# Patient Record
Sex: Male | Born: 1985 | Race: Black or African American | Hispanic: No | Marital: Single | Smoking: Former smoker
Health system: Southern US, Community
[De-identification: ages and names within clinical notes are randomized; demographics above are authoritative.]

## PROBLEM LIST (undated history)

## (undated) ENCOUNTER — Ambulatory Visit (HOSPITAL_COMMUNITY): Admission: EM | Disposition: A | Discharge status: Home / Self Care | Payer: No Payment, Other

## (undated) DIAGNOSIS — F319 Bipolar disorder, unspecified: Secondary | ICD-10-CM

## (undated) DIAGNOSIS — F909 Attention-deficit hyperactivity disorder, unspecified type: Secondary | ICD-10-CM

## (undated) DIAGNOSIS — E079 Disorder of thyroid, unspecified: Secondary | ICD-10-CM

## (undated) DIAGNOSIS — I10 Essential (primary) hypertension: Secondary | ICD-10-CM

## (undated) DIAGNOSIS — J302 Other seasonal allergic rhinitis: Secondary | ICD-10-CM

## (undated) DIAGNOSIS — F99 Mental disorder, not otherwise specified: Secondary | ICD-10-CM

## (undated) HISTORY — PX: WISDOM TOOTH EXTRACTION: SHX21

## (undated) HISTORY — DX: Essential (primary) hypertension: I10

## (undated) HISTORY — DX: Attention-deficit hyperactivity disorder, unspecified type: F90.9

## (undated) HISTORY — DX: Other seasonal allergic rhinitis: J30.2

---
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
## 2008-02-01 ENCOUNTER — Emergency Department (HOSPITAL_COMMUNITY): Admission: EM | Admit: 2008-02-01 | Discharge: 2008-02-01 | Payer: Self-pay | Admitting: Emergency Medicine

## 2011-04-29 ENCOUNTER — Emergency Department (HOSPITAL_COMMUNITY)
Admission: EM | Admit: 2011-04-29 | Discharge: 2011-04-29 | Disposition: A | Discharge status: Home / Self Care | Payer: BC Managed Care – PPO | Attending: Emergency Medicine | Admitting: Emergency Medicine

## 2011-04-29 DIAGNOSIS — M26609 Unspecified temporomandibular joint disorder, unspecified side: Secondary | ICD-10-CM | POA: Insufficient documentation

## 2011-04-29 DIAGNOSIS — H6120 Impacted cerumen, unspecified ear: Secondary | ICD-10-CM | POA: Insufficient documentation

## 2013-07-02 ENCOUNTER — Encounter (HOSPITAL_COMMUNITY): Payer: Self-pay | Admitting: Emergency Medicine

## 2013-07-02 ENCOUNTER — Emergency Department (HOSPITAL_COMMUNITY)
Admission: EM | Admit: 2013-07-02 | Discharge: 2013-07-03 | Disposition: A | Discharge status: Other Acute Inpatient Hospital | Payer: Self-pay | Attending: Emergency Medicine | Admitting: Emergency Medicine

## 2013-07-02 DIAGNOSIS — F319 Bipolar disorder, unspecified: Secondary | ICD-10-CM | POA: Diagnosis present

## 2013-07-02 DIAGNOSIS — F17200 Nicotine dependence, unspecified, uncomplicated: Secondary | ICD-10-CM | POA: Insufficient documentation

## 2013-07-02 DIAGNOSIS — F22 Delusional disorders: Secondary | ICD-10-CM | POA: Insufficient documentation

## 2013-07-02 DIAGNOSIS — R45850 Homicidal ideations: Secondary | ICD-10-CM

## 2013-07-02 DIAGNOSIS — R45851 Suicidal ideations: Secondary | ICD-10-CM | POA: Insufficient documentation

## 2013-07-02 DIAGNOSIS — Z79899 Other long term (current) drug therapy: Secondary | ICD-10-CM | POA: Insufficient documentation

## 2013-07-02 DIAGNOSIS — G479 Sleep disorder, unspecified: Secondary | ICD-10-CM | POA: Insufficient documentation

## 2013-07-02 DIAGNOSIS — F3012 Manic episode without psychotic symptoms, moderate: Secondary | ICD-10-CM | POA: Insufficient documentation

## 2013-07-02 DIAGNOSIS — F3112 Bipolar disorder, current episode manic without psychotic features, moderate: Secondary | ICD-10-CM

## 2013-07-02 DIAGNOSIS — Z8659 Personal history of other mental and behavioral disorders: Secondary | ICD-10-CM | POA: Insufficient documentation

## 2013-07-02 HISTORY — DX: Mental disorder, not otherwise specified: F99

## 2013-07-02 LAB — COMPREHENSIVE METABOLIC PANEL
AST: 28 U/L (ref 0–37)
Albumin: 4.4 g/dL (ref 3.5–5.2)
Alkaline Phosphatase: 58 U/L (ref 39–117)
Chloride: 101 mEq/L (ref 96–112)
Potassium: 3.8 mEq/L (ref 3.5–5.1)
Sodium: 138 mEq/L (ref 135–145)
Total Bilirubin: 0.8 mg/dL (ref 0.3–1.2)

## 2013-07-02 LAB — URINALYSIS, ROUTINE W REFLEX MICROSCOPIC
Hgb urine dipstick: NEGATIVE
Ketones, ur: NEGATIVE mg/dL
Protein, ur: 30 mg/dL — AB
Urobilinogen, UA: 1 mg/dL (ref 0.0–1.0)

## 2013-07-02 LAB — URINE MICROSCOPIC-ADD ON

## 2013-07-02 LAB — CBC
Platelets: 229 10*3/uL (ref 150–400)
RDW: 13.2 % (ref 11.5–15.5)
WBC: 7.5 10*3/uL (ref 4.0–10.5)

## 2013-07-02 LAB — RAPID URINE DRUG SCREEN, HOSP PERFORMED
Amphetamines: NOT DETECTED
Tetrahydrocannabinol: POSITIVE — AB

## 2013-07-02 MED ORDER — IBUPROFEN 200 MG PO TABS: 600.0000 mg | ORAL_TABLET | Freq: Three times a day (TID) | ORAL | Status: DC | PRN

## 2013-07-02 MED ORDER — ACETAMINOPHEN 325 MG PO TABS
650.0000 mg | ORAL_TABLET | ORAL | Status: DC | PRN
Start: 2013-07-02 — End: 2013-07-03
  Administered 2013-07-02 – 2013-07-03 (×3): 650 mg via ORAL
  Filled 2013-07-02 (×3): qty 2

## 2013-07-02 MED ORDER — LORAZEPAM 1 MG PO TABS
1.0000 mg | ORAL_TABLET | Freq: Three times a day (TID) | ORAL | Status: DC | PRN
  Administered 2013-07-02: 1 mg via ORAL
  Filled 2013-07-02 (×2): qty 1

## 2013-07-02 NOTE — ED Provider Notes (Signed)
History    CSN: [id number] Arrival date & time 07/02/13  1554  First MD Initiated Contact with Patient 07/02/13 1607     Chief Complaint  Patient presents with  . Medical Clearance   (Consider location/radiation/quality/duration/timing/severity/associated sxs/prior Treatment) The history is provided by the patient.  John [name] is a 27 y.o. male hx of ADHD, adopted here with suicidal, homicidal ideations. He has been having some relationship trouble and lost his job recently. He has not been sleeping for several weeks and was given some Xanax and now sleeps better. Family called the police and reported that is been having suicidal thoughts and "didn't care if he died". Family also reported some homicidal thoughts against his former girlfriend. He also has been using marijuana and sometimes alcohol. He smokes cigarettes but denies any other drug use. Denies any hallucinations and homicidal and suicidal ideations currently. However he is very tangential and wouldn't answer questions directly. Denies other medical problems. He has history of ADHD and he said he had some psychological issues because he was adopted but was never diagnosed with any psychiatric illness.   History reviewed. No pertinent past medical history. History reviewed. No pertinent past surgical history. No family history on file. History  Substance Use Topics  . Smoking status: Current Every Day Smoker    Types: Cigarettes  . Smokeless tobacco: Not on file  . Alcohol Use: No    Review of Systems  Psychiatric/Behavioral: Positive for suicidal ideas and sleep disturbance.  All other systems reviewed and are negative.    Allergies  Review of patient's allergies indicates no known allergies.  Home Medications   Current Outpatient Rx  Name  Route  Sig  Dispense  Refill  . Multiple Vitamin (MULTIVITAMIN WITH MINERALS) TABS   Oral   Take 1 tablet by mouth daily.          BP 154/116  Pulse 90  Temp(Src)  99.2 F (37.3 C) (Oral)  Resp 18  SpO2 100% Physical Exam  Nursing note and vitals reviewed. Constitutional: He is oriented to person, place, and time.  Slightly agitated, tangential   HENT:  Head: Normocephalic.  Mouth/Throat: Oropharynx is clear and moist.  Eyes: Conjunctivae are normal. Pupils are equal, round, and reactive to light.  Neck: Normal range of motion. Neck supple.  Cardiovascular: Regular rhythm and normal heart sounds.   Slightly tachy   Pulmonary/Chest: Effort normal and breath sounds normal. No respiratory distress. He has no wheezes. He has no rales.  Abdominal: Soft. Bowel sounds are normal. He exhibits no distension. There is no tenderness. There is no rebound and no guarding.  Musculoskeletal: Normal range of motion.  Neurological: He is alert and oriented to person, place, and time.  Skin: Skin is warm and dry.  Psychiatric:  Tangential. Slightly agitated. Pressured speech. Poor judgment     ED Course  Procedures (including critical care time) Labs Reviewed  URINE RAPID DRUG SCREEN (HOSP PERFORMED) - Abnormal; Notable for the following:    Tetrahydrocannabinol POSITIVE (*)    All other components within normal limits  URINALYSIS, ROUTINE W REFLEX MICROSCOPIC - Abnormal; Notable for the following:    Specific Gravity, Urine 1.034 (*)    Protein, ur 30 (*)    All other components within normal limits  CBC  COMPREHENSIVE METABOLIC PANEL  ETHANOL  URINE MICROSCOPIC-ADD ON   No results found. No diagnosis found.  MDM  John [name] is a 27 y.o. male here with agitation, pressured speech, possible  suicidal ideations. I think he may be having manic episode. Will call [facility], will likely need psych admission.   6:57 PM Labs unremarkable. UDS + THC. Transfer to [facility] for psych holding.    Richardean Canal, MD 07/02/13 [phone]

## 2013-07-02 NOTE — ED Notes (Signed)
Report called to Ellsworth Lennox RN

## 2013-07-02 NOTE — ED Notes (Signed)
Patient Wanded. Patient phone had a broken screen when he came in to the ED 

## 2013-07-02 NOTE — ED Notes (Signed)
Pt presenting to ed with [employer] at bedside with IVC paperwork. Pt states he is stressed out because of things that are going on with his girlfriend. Pt denies SI/HI at this time.

## 2013-07-02 NOTE — ED Notes (Signed)
Patient John [name]. Patient phone had a broken screen when he came in to the ED

## 2013-07-03 ENCOUNTER — Encounter (HOSPITAL_COMMUNITY): Payer: Self-pay | Admitting: *Deleted

## 2013-07-03 ENCOUNTER — Inpatient Hospital Stay (HOSPITAL_COMMUNITY)
Admission: AD | Admit: 2013-07-03 | Discharge: 2013-07-09 | DRG: 885 | Disposition: A | Discharge status: Home / Self Care | Payer: No Typology Code available for payment source | Source: Intra-hospital | Attending: Psychiatry | Admitting: Psychiatry

## 2013-07-03 DIAGNOSIS — Z79899 Other long term (current) drug therapy: Secondary | ICD-10-CM

## 2013-07-03 DIAGNOSIS — R45851 Suicidal ideations: Secondary | ICD-10-CM

## 2013-07-03 DIAGNOSIS — F411 Generalized anxiety disorder: Secondary | ICD-10-CM

## 2013-07-03 DIAGNOSIS — F319 Bipolar disorder, unspecified: Secondary | ICD-10-CM | POA: Diagnosis present

## 2013-07-03 DIAGNOSIS — F1210 Cannabis abuse, uncomplicated: Secondary | ICD-10-CM | POA: Diagnosis present

## 2013-07-03 DIAGNOSIS — F3130 Bipolar disorder, current episode depressed, mild or moderate severity, unspecified: Principal | ICD-10-CM | POA: Diagnosis present

## 2013-07-03 DIAGNOSIS — R45850 Homicidal ideations: Secondary | ICD-10-CM

## 2013-07-03 DIAGNOSIS — F1910 Other psychoactive substance abuse, uncomplicated: Secondary | ICD-10-CM

## 2013-07-03 DIAGNOSIS — F3110 Bipolar disorder, current episode manic without psychotic features, unspecified: Secondary | ICD-10-CM

## 2013-07-03 MED ORDER — LORAZEPAM 2 MG/ML IJ SOLN
2.0000 mg | Freq: Once | INTRAMUSCULAR | Status: AC
  Administered 2013-07-03: 2 mg via INTRAMUSCULAR
  Filled 2013-07-03: qty 1

## 2013-07-03 MED ORDER — LORAZEPAM 1 MG PO TABS: 2.0000 mg | ORAL_TABLET | Freq: Once | ORAL | Status: DC

## 2013-07-03 MED ORDER — QUETIAPINE FUMARATE 50 MG PO TABS
50.0000 mg | ORAL_TABLET | Freq: Every evening | ORAL | Status: DC | PRN
  Administered 2013-07-03: 50 mg via ORAL
  Filled 2013-07-03: qty 1

## 2013-07-03 MED ORDER — ACETAMINOPHEN 325 MG PO TABS: 650.0000 mg | ORAL_TABLET | Freq: Four times a day (QID) | ORAL | Status: DC | PRN

## 2013-07-03 MED ORDER — TRAZODONE HCL 50 MG PO TABS
50.0000 mg | ORAL_TABLET | Freq: Every evening | ORAL | Status: DC | PRN
  Administered 2013-07-03: 50 mg via ORAL
  Filled 2013-07-03 (×5): qty 1

## 2013-07-03 MED ORDER — QUETIAPINE FUMARATE 50 MG PO TABS: 50.0000 mg | ORAL_TABLET | Freq: Every evening | ORAL | Status: DC | PRN

## 2013-07-03 MED ORDER — MAGNESIUM HYDROXIDE 400 MG/5ML PO SUSP: 30.0000 mL | Freq: Every day | ORAL | Status: DC | PRN

## 2013-07-03 MED ORDER — ALUM & MAG HYDROXIDE-SIMETH 200-200-20 MG/5ML PO SUSP: 30.0000 mL | ORAL | Status: DC | PRN

## 2013-07-03 NOTE — ED Provider Notes (Signed)
Called by nurse due to patient increased anxiety and increased blood pressure. Patient will be given Ativan IM and blood pressure will be reassessed at this. Patient has no known history of high blood pressure and is a symptomatic at this time and his hypertension is likely from anxiety  John Baker, MD 07/03/13 1247

## 2013-07-03 NOTE — BHH Counselor (Signed)
Patient accepted to [facility] by Donell Sievert to Dr. Jannifer Franklin. The room assignment is 504-1. The nursing report # is [phone].

## 2013-07-03 NOTE — Progress Notes (Signed)
[facility] ED CM noted pt without a pcp CM spoke with pt who stated he did not have a pcp Pt began to talk about his girlfriend and her "cheating on him"

## 2013-07-03 NOTE — ED Notes (Signed)
Family at bedside.  tx called

## 2013-07-03 NOTE — Progress Notes (Signed)
Adult Psychoeducational Group Note  Date:  07/03/2013 Time:  10:15 PM  Group Topic/Focus:  Healthy Communication:   The focus of this group is to discuss communication, barriers to communication, as well as healthy ways to communicate with others.  Participation Level:  Minimal  Participation Quality:  Sharing  Affect:  Appropriate  Cognitive:  Alert and Appropriate  Insight: Appropriate and Good  Engagement in Group:  Limited and Supportive  Modes of Intervention:  Problem-solving  Additional Comments:  John [name] was asleep during part of the group.  He was appropriate.  [name] 07/03/2013, 10:15 PM
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
## 2013-07-03 NOTE — Consult Note (Signed)
Reason for Consult:Eval for Ip psychiatric mgmt Referring Physician: [facility] EDP  John [name] is an 27 y.o. male.  HPI: Pt is a 27 y/o AAM presenting to [facility] ED via GBP under IVC orders per request of his brother John [name]. IVC papers reportedly  Note patient is a potential harm to himself and others, as he has threatened to commit suicide as well has threatened to harm others. Patient has also reportedly been delusional and was recently fired from his job reasons unknown. Patient does endorse a hx of ADHD but  hasnt  been medicated for quite some time. Patient denies a hx of depression, psychosis, schizophrenia or bipolar d/o. Patient denies previous psychiatric hospitalizations. The patient is also denying the accusations listed per the IVC . The patient is denying SI/SA or AVH and states that he can contract for safety at this present time.  Past Medical History  Diagnosis Date  . Mental disorder     History reviewed. No pertinent past surgical history.  No family history on file.  Social History:  reports that he has been smoking Cigarettes.  He has been smoking about 0.00 packs per day. He does not have any smokeless tobacco history on file. He reports that he uses illicit drugs (Marijuana). He reports that he does not drink alcohol.  Allergies: No Known Allergies  Medications: I have reviewed the patient's current medications.  Results for orders placed during the hospital encounter of 07/02/13 (from the past 48 hour(s))  CBC     Status: None   Collection Time    07/02/13  4:20 PM      Result Value Range   WBC 7.5  4.0 - 10.5 K/uL   RBC 4.93  4.22 - 5.81 MIL/uL   Hemoglobin 14.9  13.0 - 17.0 g/dL   HCT 96.0  45.4 - 09.8 %   MCV 85.2  78.0 - 100.0 fL   MCH 30.2  26.0 - 34.0 pg   MCHC 35.5  30.0 - 36.0 g/dL   RDW 11.9  14.7 - 82.9 %   Platelets 229  150 - 400 K/uL  COMPREHENSIVE METABOLIC PANEL     Status: None   Collection Time    07/02/13  4:20 PM      Result Value Range    Sodium 138  135 - 145 mEq/L   Potassium 3.8  3.5 - 5.1 mEq/L   Chloride 101  96 - 112 mEq/L   CO2 27  19 - 32 mEq/L   Glucose, Bld 89  70 - 99 mg/dL   BUN 14  6 - 23 mg/dL   Creatinine, Ser 5.62  0.50 - 1.35 mg/dL   Calcium 13.0  8.4 - 86.5 mg/dL   Total Protein 7.6  6.0 - 8.3 g/dL   Albumin 4.4  3.5 - 5.2 g/dL   AST 28  0 - 37 U/L   ALT 19  0 - 53 U/L   Alkaline Phosphatase 58  39 - 117 U/L   Total Bilirubin 0.8  0.3 - 1.2 mg/dL   GFR calc non Af Amer >90  >90 mL/min   GFR calc Af Amer >90  >90 mL/min   Comment:            The eGFR has been calculated     using the CKD EPI equation.     This calculation has not been     validated in all clinical     situations.     eGFR's  persistently     <90 mL/min signify     possible Chronic Kidney Disease.  ETHANOL     Status: None   Collection Time    07/02/13  4:20 PM      Result Value Range   Alcohol, Ethyl (B) <11  0 - 11 mg/dL   Comment:            LOWEST DETECTABLE LIMIT FOR     SERUM ALCOHOL IS 11 mg/dL     FOR MEDICAL PURPOSES ONLY  URINE RAPID DRUG SCREEN (HOSP PERFORMED)     Status: Abnormal   Collection Time    07/02/13  5:05 PM      Result Value Range   Opiates NONE DETECTED  NONE DETECTED   Cocaine NONE DETECTED  NONE DETECTED   Benzodiazepines NONE DETECTED  NONE DETECTED   Amphetamines NONE DETECTED  NONE DETECTED   Tetrahydrocannabinol POSITIVE (*) NONE DETECTED   Barbiturates NONE DETECTED  NONE DETECTED   Comment:            DRUG SCREEN FOR MEDICAL PURPOSES     ONLY.  IF CONFIRMATION IS NEEDED     FOR ANY PURPOSE, NOTIFY LAB     WITHIN 5 DAYS.                LOWEST DETECTABLE LIMITS     FOR URINE DRUG SCREEN     Drug Class       Cutoff (ng/mL)     Amphetamine      1000     Barbiturate      200     Benzodiazepine   200     Tricyclics       300     Opiates          300     Cocaine          300     THC              50  URINALYSIS, ROUTINE W REFLEX MICROSCOPIC     Status: Abnormal   Collection Time     07/02/13  5:06 PM      Result Value Range   Color, Urine YELLOW  YELLOW   APPearance CLEAR  CLEAR   Specific Gravity, Urine 1.034 (*) 1.005 - 1.030   pH 7.5  5.0 - 8.0   Glucose, UA NEGATIVE  NEGATIVE mg/dL   Hgb urine dipstick NEGATIVE  NEGATIVE   Bilirubin Urine NEGATIVE  NEGATIVE   Ketones, ur NEGATIVE  NEGATIVE mg/dL   Protein, ur 30 (*) NEGATIVE mg/dL   Urobilinogen, UA 1.0  0.0 - 1.0 mg/dL   Nitrite NEGATIVE  NEGATIVE   Leukocytes, UA NEGATIVE  NEGATIVE  URINE MICROSCOPIC-ADD ON     Status: None   Collection Time    07/02/13  5:06 PM      Result Value Range   Urine-Other MUCOUS PRESENT      No results found.  Review of Systems  Unable to perform ROS: psychiatric disorder  Psychiatric/Behavioral: Positive for substance abuse. Negative for depression, suicidal ideas, hallucinations and memory loss. The patient has insomnia. The patient is not nervous/anxious.        Patient is denying SI/SA/H or AVH   Blood pressure 152/105, pulse 67, temperature 99.2 F (37.3 C), temperature source Oral, resp. rate 18, SpO2 100.00%. Physical Exam  Constitutional: He is oriented to person, place, and time. He appears well-developed.  HENT:  Head: Normocephalic.  Eyes: Pupils are equal, round, and reactive to light.  Neck: Neck supple. No thyromegaly present.  Cardiovascular: Normal rate.   Respiratory: Breath sounds normal.  GI: Bowel sounds are normal.  Neurological: He is alert and oriented to person, place, and time.  Skin: Skin is warm and dry.  Psychiatric:  Patient alert and orientated, with good eye contact and rambling non pressured speech pattern flight of ideas with varied thoughts, denies paranoia, SI/HI or AVH    Assessment/Plan: 1) Admit to [address] [facility] for safety, crises mgmt and stabilization of Bipolar 1 with mania but without AVH 2) Social work to aid in OP support psychiatric services 3) Administration of psychotropics and psychotherapeutic interventions 4) mgmt  of co-morbid conditions  Ariyonna Twichell E 07/03/2013, 1:12 AM

## 2013-07-03 NOTE — Tx Team (Signed)
Initial Interdisciplinary Treatment Plan  PATIENT STRENGTHS: (choose at least two) Ability for insight Average or above average intelligence Capable of independent living  PATIENT STRESSORS: Loss of girlfriend   PROBLEM LIST: Problem List/Patient Goals Date to be addressed Date deferred Reason deferred Estimated date of resolution  Depression 07/03/13                                                      DISCHARGE CRITERIA:  Ability to meet basic life and health needs Improved stabilization in mood, thinking, and/or behavior  PRELIMINARY DISCHARGE PLAN: Attend aftercare/continuing care group  PATIENT/FAMIILY INVOLVEMENT: This treatment plan has been presented to and reviewed with the patient, John [name], and/or family member, .  The patient and family have been given the opportunity to ask questions and make suggestions.  John [name], [place] 07/03/2013, 8:15 PM
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
## 2013-07-03 NOTE — Progress Notes (Signed)
[facility] CL spoke with patient. Pt stated that he has [employer], but took the [employer], "just in case." Highlighted information about [employer] of the [place]. Patient stated he was familiar with [facility] of the [place]. Provided him with their Walk In Clinic hours.

## 2013-07-04 DIAGNOSIS — F329 Major depressive disorder, single episode, unspecified: Secondary | ICD-10-CM

## 2013-07-04 DIAGNOSIS — F1910 Other psychoactive substance abuse, uncomplicated: Secondary | ICD-10-CM

## 2013-07-04 DIAGNOSIS — F3110 Bipolar disorder, current episode manic without psychotic features, unspecified: Secondary | ICD-10-CM

## 2013-07-04 DIAGNOSIS — F1994 Other psychoactive substance use, unspecified with psychoactive substance-induced mood disorder: Secondary | ICD-10-CM

## 2013-07-04 MED ORDER — TRAZODONE HCL 100 MG PO TABS
100.0000 mg | ORAL_TABLET | Freq: Every day | ORAL | Status: DC
  Administered 2013-07-04 – 2013-07-05 (×2): 100 mg via ORAL
  Filled 2013-07-04 (×3): qty 1

## 2013-07-04 MED ORDER — HYDROXYZINE HCL 50 MG PO TABS: 50.0000 mg | ORAL_TABLET | Freq: Every evening | ORAL | Status: DC | PRN

## 2013-07-04 MED ORDER — DIVALPROEX SODIUM 250 MG PO DR TAB
250.0000 mg | DELAYED_RELEASE_TABLET | Freq: Two times a day (BID) | ORAL | Status: DC
  Administered 2013-07-04 – 2013-07-05 (×2): 250 mg via ORAL
  Filled 2013-07-04 (×4): qty 1
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
## 2013-07-04 NOTE — BHH Group Notes (Signed)
[facility] LCSW Aftercare Discharge Planning Group Note   07/04/2013 12:02 PM  Participation Quality:  Appropriate  Mood/Affect:  Appropriate  Depression Rating:  0  Anxiety Rating:  2  Thoughts of Suicide:  No  Will you contract for safety?   NA  Current AVH:  No  Plan for Discharge/Comments:  Patient attending discharge planning group and actively participated in group.  Patient advised of admitting due to brother putting him here.  He shared he had a nervous breakdown two weeks ago.  Patient will need a referral for outpatient services.  He advised of having transportation and being able to obtain medications. CSW provided all participants with daily workbook.  Patient also advised of services offered by [facility].  Transportation Means:   Patient has transportation.   Supports:  Patient has limited support system.   John [name], John [name]

## 2013-07-04 NOTE — Progress Notes (Signed)
Recreation Therapy Notes   Date: 07.23.2014  Time: 3:00pm Location: [address] Dayroom  Group Topic/Focus: Problem Solving, Communication  Participation Level:  Active  Participation Quality:  Appropriate, Attentive and Sharing  Affect:  Euthymic to bright at times  Cognitive:  Appropriate  Additional Comments: Activity: Life Boat ; Explanation: Patients were given the following scenario: We have chartered a Radio producer for the afternoon with the below listed guests, half way through out trip the [employer] a leak and we start to sink. We can put everyone in the group on a life raft and sail back to shore. Including the people in this room we can eight of out guests. The list of guest is as follows: Materials engineer, [address], Janyth Pupa, Zorita Pang, [place], Runner, broadcasting/film/video, Chef, Curator, Personnel officer, Ex-Convict, Ex-Marine, Pregnant Woman, Physician, Nurse.   Patient actively participated in group activity. Patient voiced his opinion and debated appropriately with group members. Patient made suggestion to group to take their skills into account. Patient continued to use logical decision making skills throughout session. Patient identified good decision making as skills necessary to complete this activity.    Marykay Lex Aladdin Kollmann, LRT/CTRS  Halynn Reitano L 07/04/2013 4:20 PM
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
## 2013-07-04 NOTE — Progress Notes (Signed)
Adult Psychoeducational Group Note  Date:  07/04/2013 Time:  10:42 PM  Group Topic/Focus:  Wrap-Up Group:   The focus of this group is to help patients review their daily goal of treatment and discuss progress on daily workbooks.  Participation Level:  Active  Participation Quality:  Appropriate and Supportive  Affect:  Appropriate  Cognitive:  Appropriate  Insight: Appropriate  Engagement in Group:  Engaged and Supportive  Modes of Intervention:  Discussion, Socialization and Support  Additional Comments:  Pt stated he had a good day. Pt stated his goal was to have fun. Pt stated he enjoyed playing basketball outside.  Caswell Corwin 07/04/2013, 10:42 PM

## 2013-07-04 NOTE — Progress Notes (Signed)
D: Pt was given med ed on trazadone as requested.Pt. Refused trazadone @ HS. Pt stated " I am already sleepy!"A:Supported & encouraged.

## 2013-07-04 NOTE — Progress Notes (Signed)
D: Pt visible on the unit. Pt. Questioning his medications. Pt with some grandeur & flight of ideas. A: Med . Ed given . Dressings applied to lt index finger;rt wrist & lt forearm. Supported & encouraged. Continues on 15 minute checks.R: Pt safety maintained.
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
## 2013-07-04 NOTE — Progress Notes (Signed)
Adult Psychoeducational Group Note  Date:  07/04/2013 Time:  10:54 AM  Group Topic/Focus:  Therapeutic Activity  Participation Level:  Active  Participation Quality:  Appropriate, Attentive and Sharing  Affect:  Appropriate  Cognitive:  Appropriate  Insight: Appropriate  Engagement in Group:  Engaged  Modes of Intervention:  Activity  Additional Comments:  Pt participated in playing Fact or Crap and Can you name that five. Pt was appropriate and appeared pleasant by interacting and laughing with other pts.   Sharyn Lull 07/04/2013, 10:54 AM

## 2013-07-04 NOTE — [facility] Suicide Risk Assessment (Signed)
Suicide Risk Assessment  Admission Assessment     Nursing information obtained from:    Demographic factors:    Current Mental Status:    Loss Factors:    Historical Factors:    Risk Reduction Factors:     CLINICAL FACTORS:   Severe Anxiety and/or Agitation Bipolar Disorder:   Mixed State Depression:   Anhedonia Comorbid alcohol abuse/dependence Hopelessness Impulsivity Insomnia Recent sense of peace/wellbeing Severe Alcohol/Substance Abuse/Dependencies Unstable or Poor Therapeutic Relationship  COGNITIVE FEATURES THAT CONTRIBUTE TO RISK:  Closed-mindedness Loss of executive function Polarized thinking    SUICIDE RISK:   Mild:  Suicidal ideation of limited frequency, intensity, duration, and specificity.  There are no identifiable plans, no associated intent, mild dysphoria and related symptoms, good self-control (both objective and subjective assessment), few other risk factors, and identifiable protective factors, including available and accessible social support.  PLAN OF CARE: This is a involuntary admission for bipolar disorder and no treatment for crisis stabilization and medication management. He was in counseling while in foster care, in [place], [place]  I certify that inpatient services furnished can reasonably be expected to improve the patient's condition.   Hank Walling,JANARDHAHA R. 07/04/2013, 1:55 PM

## 2013-07-05 DIAGNOSIS — F1210 Cannabis abuse, uncomplicated: Secondary | ICD-10-CM

## 2013-07-05 MED ORDER — HYDROXYZINE HCL 50 MG PO TABS
50.0000 mg | ORAL_TABLET | Freq: Two times a day (BID) | ORAL | Status: DC
  Administered 2013-07-05 – 2013-07-09 (×9): 50 mg via ORAL
  Filled 2013-07-05 (×3): qty 1
  Filled 2013-07-05: qty 28
  Filled 2013-07-05 (×6): qty 1
  Filled 2013-07-05: qty 28
  Filled 2013-07-05 (×2): qty 1

## 2013-07-05 MED ORDER — DIVALPROEX SODIUM 500 MG PO DR TAB
500.0000 mg | DELAYED_RELEASE_TABLET | Freq: Two times a day (BID) | ORAL | Status: DC
  Administered 2013-07-05 – 2013-07-09 (×8): 500 mg via ORAL
  Filled 2013-07-05 (×2): qty 1
  Filled 2013-07-05: qty 28
  Filled 2013-07-05 (×2): qty 1
  Filled 2013-07-05: qty 28
  Filled 2013-07-05 (×6): qty 1

## 2013-07-05 NOTE — Progress Notes (Signed)
[facility] MD Progress Note  07/05/2013 1:58 PM John [name]  MRN:  [id number] Subjective: Patient has been struggling with mood swings, sleep disturbance, grandiose delusions LOA, FOI and disorganized speech. He has poor insight, judgment and impulse control. He has been partially cooperative and taking his medications and attending groups.   Diagnosis:  Axis I: Bipolar, Manic, Cannabis abuse  ADL's:  Intact  Sleep: Fair  Appetite:  Fair  Suicidal Ideation:  He has threatened his girl friend due to paranoid delusions Homicidal Ideation:  He has threatened his girl friend due to paranoid delusions AEB (as evidenced by):  Psychiatric Specialty Exam: ROS  Blood pressure 139/82, pulse 60, temperature 98.6 F (37 C), temperature source Oral, resp. rate 16, height 6\' 4"  (1.93 m), weight 79.379 kg (175 lb).Body mass index is 21.31 kg/(m^2).  General Appearance: Bizarre, Disheveled and Guarded  Eye Contact::  Minimal  Speech:  Pressured  Volume:  Normal  Mood:  Anxious and Euphoric  Affect:  Non-Congruent, Inappropriate and Labile  Thought Process:  Disorganized, Loose and Tangential  Orientation:  Full (Time, Place, and Person)  Thought Content:  Rumination  Suicidal Thoughts:  Yes.  without intent/plan  Homicidal Thoughts:  Yes.  without intent/plan  Memory:  Immediate;   Fair  Judgement:  Impaired  Insight:  Lacking  Psychomotor Activity:  Psychomotor Retardation  Concentration:  Fair  Recall:  Fair  Akathisia:  NA  Handed:  Right  AIMS (if indicated):     Assets:  Communication Skills Desire for Improvement Physical Health Resilience Social Support  Sleep:  Number of Hours: 5.75   Current Medications: Current Facility-Administered Medications  Medication Dose Route Frequency Provider Last Rate Last Dose  . acetaminophen (TYLENOL) tablet 650 mg  650 mg Oral Q6H PRN Kerry Hough, PA-C      . alum & mag hydroxide-simeth (MAALOX/MYLANTA) 200-200-20 MG/5ML suspension 30 mL   30 mL Oral Q4H PRN Kerry Hough, PA-C      . divalproex (DEPAKOTE) DR tablet 500 mg  500 mg Oral Q12H Nehemiah Settle, MD      . hydrOXYzine (ATARAX/VISTARIL) tablet 50 mg  50 mg Oral BID Nehemiah Settle, MD      . magnesium hydroxide (MILK OF MAGNESIA) suspension 30 mL  30 mL Oral Daily PRN Kerry Hough, PA-C      . traZODone (DESYREL) tablet 100 mg  100 mg Oral QHS Nehemiah Settle, MD   100 mg at 07/04/13 2306    Lab Results: No results found for this or any previous visit (from the past 48 hour(s)).  Physical Findings: AIMS: Facial and Oral Movements Muscles of Facial Expression: None, normal Lips and Perioral Area: None, normal Jaw: None, normal Tongue: None, normal,Extremity Movements Upper (arms, wrists, hands, fingers): None, normal Lower (legs, knees, ankles, toes): None, normal, Trunk Movements Neck, shoulders, hips: None, normal, Overall Severity Severity of abnormal movements (highest score from questions above): None, normal Incapacitation due to abnormal movements: None, normal Patient's awareness of abnormal movements (rate only patient's report): No Awareness, Dental Status Current problems with teeth and/or dentures?: No Does patient usually wear dentures?: No  CIWA:  CIWA-Ar Total: 2 COWS:  COWS Total Score: 2  Treatment Plan Summary: Daily contact with patient to assess and evaluate symptoms and progress in treatment Medication management  Plan: Treatment Plan/Recommendations:  1. Admit with IVC for crisis management and stabilization. 2. Medication management to reduce current symptoms to base line and improve the patient's overall  level of functioning. Increase depakote 500 mg BID, vistaril 50 mg BID/PRN and check VPA level. 3. Treat health problems as indicated. 4. Develop treatment plan to decrease risk of relapse upon discharge and to reduce the need for readmission. 5. Psycho-social education regarding relapse prevention  and self care. 6. Health care follow up as needed for medical problems. 7. Restart home medications where appropriate.   Medical Decision Making Problem Points:  Established problem, worsening (2), Review of last therapy session (1) and Review of psycho-social stressors (1) Data Points:  Review or order clinical lab tests (1) Review of medication regiment & side effects (2) Review of new medications or change in dosage (2)  I certify that inpatient services furnished can reasonably be expected to improve the patient's condition.   Tyra Michelle,JANARDHAHA R. 07/05/2013, 1:58 PM

## 2013-07-05 NOTE — Progress Notes (Signed)
Patient ID: John [name], male   DOB: 1986/04/02, 27 y.o.   MRN: [id number]  D: Took over patient's care @ 2330. Patient in bed sleeping. Respiration regular and unlabored. No sign of distress noted at this time A: 15 mins checks for safety. R: Patient remains asleep. Pt is safe.

## 2013-07-05 NOTE — Consult Note (Signed)
Reviewed the information documented and agree with the treatment plan.  Jarell Mcewen,JANARDHAHA R. 07/05/2013 3:19 PM

## 2013-07-05 NOTE — [facility] Suicide Risk Assessment (Signed)
[facility] INPATIENT:  Family/Significant Other Suicide Prevention Education  Suicide Prevention Education:  Education Completed; John [name], Mother, [facility] Lobby;  has been identified by the patient as the family member/significant other with whom the patient will be residing, and identified as the person(s) who will aid the patient in the event of a mental health crisis (suicidal ideations/suicide attempt).  With written consent from the patient, the family member/significant other has been provided the following suicide prevention education, prior to the and/or following the discharge of the patient.  The suicide prevention education provided includes the following:  Suicide risk factors  Suicide prevention and interventions  National Suicide Hotline telephone number  [facility] assessment telephone number  [facility] Emergency Assistance 911  [facility] and/or Residential Mobile Crisis Unit telephone number  Request made of family/significant other to:  Remove weapons (e.g., guns, rifles, knives), all items previously/currently identified as safety concern.  Mother reports patient does not have access to guns.   Remove drugs/medications (over-the-counter, prescriptions, illicit drugs), all items previously/currently identified as a safety concern.  The family member/significant other verbalizes understanding of the suicide prevention education information provided.  The family member/significant other agrees to remove the items of safety concern listed above.  John [name] 07/05/2013, 4:06 PM

## 2013-07-05 NOTE — [facility] Group Notes (Signed)
[facility] LCSW Group Therapy  [facility] 1:15 - 2:30 PM  07/05/2013   Type of Therapy:  Group Therapy  Participation Level:  Active  Participation Quality:  Attentive  Affect:  Appropriate  Cognitive:  Appropriate  Insight:  Developing/Improving and Engaged  Engagement in Therapy:  Developing/Improving Engaged  Modes of Intervention:  Discussion, Education, Exploration, Problem-Solving, Rapport Building, Support   Summary of Progress/Problems:  Patient listened attentively to speaker from [facility]. Patient advised he would like to volunteer with [employer].  Patient was advised he would need to complete their programming before he can volunteer.  Wynn Banker 07/05/2013

## 2013-07-05 NOTE — Progress Notes (Signed)
Adult Psychoeducational Group Note  Date:  07/05/2013 Time:  09:00 AM  Group Topic/Focus:  Dimensions of Wellness:   The focus of this group is to introduce the topic of wellness and discuss the role each dimension of wellness plays in total health.  Participation Level:  Active  Participation Quality:  Appropriate and Attentive  Affect:  Appropriate  Cognitive:  Alert and Oriented  Insight: Good  Engagement in Group:  Engaged  Modes of Intervention:  Discussion  Additional Comments:  Pt. Shared with the group that he's been taking care of others for so long that he began to forget about himself. He discussed that at first he was upset that his family had him committed here, but he's now happy it happened because he's able to focus on himself and have clearer thoughts about things in his life.  Harold Barban E 07/05/2013, 10:53 AM

## 2013-07-05 NOTE — Progress Notes (Addendum)
Patient stated he feels he only needs vistaril 25 mg at this time.   Patient stated rezine and vitamin in mornings.  Wanted depakote in afternoon or throughout day as needed.  Wants desyrel at night to sleep prn.  Quintella Reichert RN 07/05/2013 at 1453  D:  Patient's self inventory form, patient sleeps well, has good appetite, high energy level, good attention.  Denied depression, hopelessness, anxiety.  Denied withdrawals.  Denied SI.  Has felt lightheaded in past 24 hours.  Zero pain goal, zero pain.  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  Thanks for all your help.  Ready to go home.  No problems with meds after discharge, can afford his meds.  Will talk to MD about medications. A:  Medications administered per MD orders.  Emotional support and encouragement given patient. R:  Denied SI and HI.  Denied A/V hallucinations.  Will continue to monitor patient for safety with 15 minute checks.   Safety maintained.

## 2013-07-06 DIAGNOSIS — F1210 Cannabis abuse, uncomplicated: Secondary | ICD-10-CM | POA: Diagnosis present

## 2013-07-06 MED ORDER — TRAZODONE HCL 100 MG PO TABS
100.0000 mg | ORAL_TABLET | Freq: Every evening | ORAL | Status: DC | PRN
  Administered 2013-07-06: 100 mg via ORAL
  Filled 2013-07-06: qty 14

## 2013-07-06 NOTE — Progress Notes (Signed)
D.  Pt. Cooperative.  Pt. Reports that he is feeling much better today. Reports that "yesterday he was stressed today he is blessed."  Pt. Reports that his mother is supportive of him and that makes him happy. A.  Encouragement and support given. R. Pt. Receptive.

## 2013-07-06 NOTE — Progress Notes (Signed)
Adult Psychoeducational Group Note  Date:  07/06/2013 Time:  12:28 PM  Group Topic/Focus:  Relapse Prevention Planning:   The focus of this group is to define relapse and discuss the need for planning to combat relapse.  Participation Level:  Minimal  Participation Quality:  Appropriate and Attentive  Affect:  Appropriate  Cognitive:  Alert and Appropriate  Insight: Good  Engagement in Group:  Engaged  Modes of Intervention:  Activity, Discussion, Socialization and Support  Additional Comments:  Pt came to group, but had to leave to see the doctor. Pt did share with the group about personal triggers and early warning signs for relapse.   Cathlean Cower 07/06/2013, 12:28 PM

## 2013-07-06 NOTE — [facility] Group Notes (Signed)
[facility] LCSW Group Therapy  07/06/2013  Type of Therapy:  Group Therapy  Participation Level:  Active  Participation Quality:  Appropriate  Affect:  Depressed  Cognitive:  Appropriate  Insight:  Developing/Improving and Engaged  Engagement in Therapy:  Developing/Improving and Engaged  Modes of Intervention:  Discussion, Education, Exploration, Problem-Solving, Rapport Building, Support   Summary of Progress/Problems:  The topic for today was feelings around relapse. He shared relapse him means not taking to to listened and do what is needed to continue moving forward.  Patient discussed what relapse prevention is to them and identified triggers that are on the patient to relapse.  Patient processed his/her feelings toward relapse and was able to related to peers. Patient identified coping skills that can be used to prevent a relapse.   Wynn Banker 07/06/2013

## 2013-07-06 NOTE — Progress Notes (Signed)
[facility] MD Progress Note  07/06/2013 11:27 AM John [name]  MRN:  [id number] Subjective:  "I'm happy that my family is supporting me, I feel good about taking the medication." Objective: the patient is disorganized, with pressured speech, he is concerned about medication and paying his bills. He also admits to using GF's xanax and smoking THC 3-4 x a day everyday for months. He is paranoid, with some delusions.     Diagnosis: psychosis ADL's:  Intact  Sleep: Good  Appetite:  Good  Suicidal Ideation:  denies Homicidal Ideation:  denies AEB (as evidenced by):   Psychiatric Specialty Exam: Review of Systems  Constitutional: Negative.  Negative for fever, chills, weight loss, malaise/fatigue and diaphoresis.  HENT: Negative for congestion and sore throat.   Eyes: Negative for blurred vision, double vision and photophobia.  Respiratory: Negative for cough, shortness of breath and wheezing.   Cardiovascular: Negative for chest pain, palpitations and PND.  Gastrointestinal: Negative for heartburn, nausea, vomiting, abdominal pain, diarrhea and constipation.  Musculoskeletal: Negative for myalgias, joint pain and falls.  Neurological: Negative for dizziness, tingling, tremors, sensory change, speech change, focal weakness, seizures, loss of consciousness, weakness and headaches.  Endo/Heme/Allergies: Negative for polydipsia. Does not bruise/bleed easily.  Psychiatric/Behavioral: Negative for depression, suicidal ideas, hallucinations, memory loss and substance abuse. The patient is not nervous/anxious and does not have insomnia.     Blood pressure 145/85, pulse 76, temperature 97.3 F (36.3 C), temperature source Oral, resp. rate 16, height 6\' 4"  (1.93 m), weight 79.379 kg (175 lb).Body mass index is 21.31 kg/(m^2).  General Appearance: Casual  Eye Contact::  Good  Speech:  Clear and Coherent  Volume:  Normal  Mood:  Anxious and Depressed  Affect:  Congruent  Thought Process:   Circumstantial  Orientation:  Full (Time, Place, and Person)  Thought Content:  Paranoid with some delusions  Suicidal Thoughts:  Yes.  without intent/plan  Homicidal Thoughts:  No  Memory:  Immediate;   Fair  Judgement:  Intact  Insight:  Lacking  Psychomotor Activity:  Normal  Concentration:  Fair  Recall:  Fair  Akathisia:  No  Handed:  Right  AIMS (if indicated):     Assets:  Communication Skills Desire for Improvement Housing Physical Health Social Support  Sleep:  Number of Hours: 5.75   Current Medications: Current Facility-Administered Medications  Medication Dose Route Frequency Provider Last Rate Last Dose  . acetaminophen (TYLENOL) tablet 650 mg  650 mg Oral Q6H PRN Kerry Hough, PA-C      . alum & mag hydroxide-simeth (MAALOX/MYLANTA) 200-200-20 MG/5ML suspension 30 mL  30 mL Oral Q4H PRN Kerry Hough, PA-C      . divalproex (DEPAKOTE) DR tablet 500 mg  500 mg Oral Q12H Nehemiah Settle, MD   500 mg at 07/06/13 0816  . hydrOXYzine (ATARAX/VISTARIL) tablet 50 mg  50 mg Oral BID Nehemiah Settle, MD   50 mg at 07/06/13 0815  . magnesium hydroxide (MILK OF MAGNESIA) suspension 30 mL  30 mL Oral Daily PRN Kerry Hough, PA-C      . traZODone (DESYREL) tablet 100 mg  100 mg Oral QHS Nehemiah Settle, MD   100 mg at 07/05/13 2253    Lab Results: No results found for this or any previous visit (from the past 48 hour(s)).  Physical Findings: AIMS: Facial and Oral Movements Muscles of Facial Expression: None, normal Lips and Perioral Area: None, normal Jaw: None, normal Tongue: None, normal,Extremity Movements Upper (  arms, wrists, hands, fingers): None, normal Lower (legs, knees, ankles, toes): None, normal, Trunk Movements Neck, shoulders, hips: None, normal, Overall Severity Severity of abnormal movements (highest score from questions above): None, normal Incapacitation due to abnormal movements: None, normal Patient's awareness of  abnormal movements (rate only patient's report): No Awareness, Dental Status Current problems with teeth and/or dentures?: No Does patient usually wear dentures?: No  CIWA:  CIWA-Ar Total: 1 COWS:  COWS Total Score: 1  Treatment Plan Summary: Daily contact with patient to assess and evaluate symptoms and progress in treatment Medication management  Plan: 1. Continue crisis management and stabilization. 2. Medication management to reduce current symptoms to base line and improve patient's overall level of functioning 3. Treat health problems as indicated. 4. Develop treatment plan to decrease risk of relapse upon discharge and the need for readmission. 5. Psycho-social education regarding relapse prevention and self care. 6. Health care follow up as needed for medical problems. 7. Continue home medications where appropriate. 8.Continue current plan of care with plans for depakote level on Sunday AM. 9. Changed Trazodone to prn. 10. ELOS:2-3 days. 11. Due to his paranoia, delusions and disorganization discharge is not appropriate at this time. Will follow through and if needed consider adding an antipsychotic.  Medical Decision Making Problem Points:  Established problem, stable/improving (1) Data Points:  Review or order clinical lab tests (1)  I certify that inpatient services furnished can reasonably be expected to improve the patient's condition.  Rona Ravens. Kin Galbraith RPAC 11:42 AM 07/06/2013

## 2013-07-06 NOTE — Tx Team (Signed)
Interdisciplinary Treatment Plan Update   Date Reviewed:  07/06/2013  Time Reviewed:  9:49 AM  Progress in Treatment:   Attending groups: Yes Participating in groups: Yes Taking medication as prescribed: Yes  Tolerating medication: Yes Family/Significant other contact made: Yes, contact made with mother  Patient understands diagnosis: Yes  Discussing patient identified problems/goals with staff: Yes Medical problems stabilized or resolved: Yes Denies suicidal/homicidal ideation: Yes Patient has not harmed self or others: Yes  For review of initial/current patient goals, please see plan of care.  Estimated Length of Stay:  2-4 days  Reasons for Continued Hospitalization:  Anxiety Depression Medication stabilization Suicidal ideation  New Problems/Goals identified:    Discharge Plan or Barriers:   Home with outpatient follow up with [facility]  Additional Comments: N/A  Attendees:  Patient:  07/06/2013 9:49 AM   Signature: Mervyn Gay, MD 07/06/2013 9:49 AM  Signature:  Verne Spurr, PA 07/06/2013 9:49 AM  Signature: Harold Barban, RN 07/06/2013 9:49 AM  Signature: Alease Frame,, RN 07/06/2013 9:49 AM  Signature:  Neill Loft RN 07/06/2013 9:49 AM  Signature:  Juline Patch, LCSW 07/06/2013 9:49 AM  Signature:  Reyes Ivan, LCSW 07/06/2013 9:49 AM  Signature:   07/06/2013 9:49 AM  Signature: 07/06/2013 9:49 AM  Signature:    Signature:    Signature:      Scribe for Treatment Team:   Juline Patch,  07/06/2013 9:49 AM

## 2013-07-06 NOTE — Progress Notes (Signed)
D slept well last nite after requesting meds, appetite is good, energy level is normal and ability to pay attention is good and continues to improve. Depressed 0/10 and hopeless 0/10 today, denies Si or Hi and no AVH today his goal for today is to keep the past in the past and to move on with his life, he also wants to go back to school. A q37min safety checks continue and support offered, has been attending groups and taking meds as ordered by MD, judgement and insight are poor as well as his impulse control. Patient remains safe on the unit

## 2013-07-06 NOTE — [facility] Group Notes (Signed)
[facility] LCSW Aftercare Discharge Planning Group Note   07/06/2013 11:08 AM  Participation Quality:  Appropriate  Mood/Affect:  Appropriate  Depression Rating:  0   Anxiety Rating:  0  Thoughts of Suicide:No  Will you contract for safety?   NA  Current AVH:  No  Plan for Discharge/Comments:  Patient attending discharge planning group and actively participated in group.  Patient reports doing well today and hopes to discharge home soon.  Patient to be referred to [facility] for outpatient services.CSW provided all participants with daily workbook.  Patient also advised of services offered by [facility].   Transportation Means: Patient has transportation.   Supports:  Patient has limited support system.   John [name], [name]

## 2013-07-06 NOTE — Clinical Social Work Note (Signed)
Late Entry for 07/05/13 Writer met with patient, patient's mother and AC.  Patient was requesting to discharge immediately and mother stated he could discharge to her custody.  Mother was reminded that she had seen MD as he was leaving the building earlier in the afternoon and that patient would not be able to d/c without MD writing d/c order.  She asked if it she could just take patient.  Writer explained policy for this facility regarding d/c.  AC check chart and advised patient was voluntary.  Patient and mother advised of 72 hour request for d/c.  Patient and mother informed treatment team meets on Friday AM and writer would notify mother after the meeting if patient being d/c'd.    Writer left message for mother this morning asking if she can assist with making a deposit to schedule outpatient follow up as our outpatient service is into the middle of September with appointments. She was also advised we can scheduled with the local mental health if she or patient is unable to meet deposit with private providers.

## 2013-07-06 NOTE — Progress Notes (Signed)
Patient ID: John [name], male   DOB: [birth year], 27 y.o.   MRN: [id number]  D: Pt denies SI/HI/AVH. Pt is pleasant and cooperative.Pt states he had a better day that he worked things out with his family. "I can't believe my brother would believe the word of that girl over me, It was that girl telling him those things"  A: Pt was offered support and encouragement. Pt was given scheduled medications. Pt was encourage to attend groups. Q 15 minute checks were done for safety.   R:Pt attends groups and interacts well with peers and staff. Pt is taking medication. Pt has no complaints.Pt receptive to treatment and safety maintained on unit.

## 2013-07-06 NOTE — Progress Notes (Signed)
Adult Psychoeducational Group Note  Date:  07/06/2013 Time:  9:41 PM  Group Topic/Focus:  Wrap-Up Group:   The focus of this group is to help patients review their daily goal of treatment and discuss progress on daily workbooks.  Participation Level:  Active  Participation Quality:  Appropriate  Affect:  Appropriate  Cognitive:  Alert and Appropriate  Insight: Appropriate and Good  Engagement in Group:  Engaged  Modes of Intervention:  Discussion  Additional Comments:    Kendrick Remigio A 07/06/2013, 9:41 PM

## 2013-07-06 NOTE — Progress Notes (Signed)
[facility] Group Notes:  (Nursing/MHT/Case Management/Adjunct)  Date:  07/06/2013  Time:  1:40 PM  Type of Therapy:  Therapeutic Activity  Participation Level:  Active  Participation Quality:  Appropriate, Attentive and Sharing  Affect:  Appropriate  Cognitive:  Appropriate  Insight:  Improving  Engagement in Group:  Developing/Improving  Modes of Intervention:  Activity, Discussion, Exploration, Socialization and Support  Summary of Progress/Problems: Ruvim attended and participated in coping skills pictionary, where one person in the group comes up and chooses a coping skill and draws a picture on the board and patient guesses what the picture is. Patients learned how communication was a big part of the game and how you interpret and share information with others to get your point across.   Karleen Hampshire Brittini 07/06/2013, 1:40 PM

## 2013-07-07 DIAGNOSIS — F29 Unspecified psychosis not due to a substance or known physiological condition: Secondary | ICD-10-CM

## 2013-07-07 NOTE — Progress Notes (Signed)
Reviewed the information documented and agree with the treatment plan.  Atom Solivan,JANARDHAHA R. 07/07/2013 6:33 PM 

## 2013-07-07 NOTE — Progress Notes (Signed)
Psychoeducational Group Note  Date: 07/07/2013 Time: 1015  Group Topic/Focus:  Identifying Needs:   The focus of this group is to help patients identify their personal needs that have been historically problematic and identify healthy behaviors to address their needs.  Participation Level:  Active  Participation Quality: good  Affect: depressed   Cognitive:  Intact   Insight:  good  Engagement in Group: engaged  Additional Comments:

## 2013-07-07 NOTE — Progress Notes (Signed)
Patient ID: John [name], male   DOB: 10-05-1986, 27 y.o.   MRN: [id number] [facility] MD Progress Note  07/07/2013 4:08 PM Torris House  MRN:  [id number]  Subjective:  John [name] has good mood today and stated that he does not need to take his sleeping medication. "I'm happy that my family is supporting me, I feel good about taking the medication." He continue to be disorganized, with pressured speech, he is concerned about medication and paying his bills. He is paranoid, with some delusions.     Diagnosis: psychosis ADL's:  Intact  Sleep: Good  Appetite:  Good  Suicidal Ideation:  denies Homicidal Ideation:  denies AEB (as evidenced by):   Psychiatric Specialty Exam: Review of Systems  Constitutional: Negative.  Negative for fever, chills, weight loss, malaise/fatigue and diaphoresis.  HENT: Negative for congestion and sore throat.   Eyes: Negative for blurred vision, double vision and photophobia.  Respiratory: Negative for cough, shortness of breath and wheezing.   Cardiovascular: Negative for chest pain, palpitations and PND.  Gastrointestinal: Negative for heartburn, nausea, vomiting, abdominal pain, diarrhea and constipation.  Musculoskeletal: Negative for myalgias, joint pain and falls.  Neurological: Negative for dizziness, tingling, tremors, sensory change, speech change, focal weakness, seizures, loss of consciousness, weakness and headaches.  Endo/Heme/Allergies: Negative for polydipsia. Does not bruise/bleed easily.  Psychiatric/Behavioral: Negative for depression, suicidal ideas, hallucinations, memory loss and substance abuse. The patient is not nervous/anxious and does not have insomnia.     Blood pressure 135/79, pulse 81, temperature 97.1 F (36.2 C), temperature source Oral, resp. rate 20, height 6\' 4"  (1.93 m), weight 79.379 kg (175 lb).Body mass index is 21.31 kg/(m^2).  General Appearance: Casual  Eye Contact::  Good  Speech:  Clear and Coherent  Volume:  Normal   Mood:  Anxious and Depressed  Affect:  Congruent  Thought Process:  Circumstantial  Orientation:  Full (Time, Place, and Person)  Thought Content:  Paranoid with some delusions  Suicidal Thoughts:  Yes.  without intent/plan  Homicidal Thoughts:  No  Memory:  Immediate;   Fair  Judgement:  Intact  Insight:  Lacking  Psychomotor Activity:  Normal  Concentration:  Fair  Recall:  Fair  Akathisia:  No  Handed:  Right  AIMS (if indicated):     Assets:  Communication Skills Desire for Improvement Housing Physical Health Social Support  Sleep:  Number of Hours: 6   Current Medications: Current Facility-Administered Medications  Medication Dose Route Frequency Provider Last Rate Last Dose  . acetaminophen (TYLENOL) tablet 650 mg  650 mg Oral Q6H PRN Kerry Hough, PA-C      . alum & mag hydroxide-simeth (MAALOX/MYLANTA) 200-200-20 MG/5ML suspension 30 mL  30 mL Oral Q4H PRN Kerry Hough, PA-C      . divalproex (DEPAKOTE) DR tablet 500 mg  500 mg Oral Q12H Nehemiah Settle, MD   500 mg at 07/06/13 0816  . hydrOXYzine (ATARAX/VISTARIL) tablet 50 mg  50 mg Oral BID Nehemiah Settle, MD   50 mg at 07/06/13 0815  . magnesium hydroxide (MILK OF MAGNESIA) suspension 30 mL  30 mL Oral Daily PRN Kerry Hough, PA-C      . traZODone (DESYREL) tablet 100 mg  100 mg Oral QHS Nehemiah Settle, MD   100 mg at 07/05/13 2253    Lab Results:  Results for orders placed during the hospital encounter of 07/03/13 (from the past 48 hour(s))  VALPROIC ACID LEVEL     Status: None  Collection Time    07/07/13  6:43 AM      Result Value Range   Valproic Acid Lvl 58.8  50.0 - 100.0 ug/mL    Physical Findings: AIMS: Facial and Oral Movements Muscles of Facial Expression: None, normal Lips and Perioral Area: None, normal Jaw: None, normal Tongue: None, normal,Extremity Movements Upper (arms, wrists, hands, fingers): None, normal Lower (legs, knees, ankles, toes):  None, normal, Trunk Movements Neck, shoulders, hips: None, normal, Overall Severity Severity of abnormal movements (highest score from questions above): None, normal Incapacitation due to abnormal movements: None, normal Patient's awareness of abnormal movements (rate only patient's report): No Awareness, Dental Status Current problems with teeth and/or dentures?: No Does patient usually wear dentures?: No  CIWA:  CIWA-Ar Total: 2 COWS:  COWS Total Score: 0  Treatment Plan Summary: Daily contact with patient to assess and evaluate symptoms and progress in treatment Medication management  Plan: 1. Continue crisis management and stabilization. 2. Medication management to reduce current symptoms to base line and improve patient's overall level of functioning 3. Treat health problems as indicated. 4. Develop treatment plan to decrease risk of relapse upon discharge and the need for readmission. 5. Psycho-social education regarding relapse prevention and self care. 6. Health care follow up as needed for medical problems. 7. Continue home medications where appropriate. 8.Continue current plan of care with plans for depakote level on Sunday AM. 9. Changed Trazodone to prn. 10. ELOS:2-3 days. 11. Due to his paranoia, delusions and disorganization discharge is not appropriate at this time.  12. Will follow through and if needed consider adding an antipsychotic.  Medical Decision Making Problem Points:  Established problem, stable/improving (1) Data Points:  Review or order clinical lab tests (1)  I certify that inpatient services furnished can reasonably be expected to improve the patient's condition.    Jaydi Bray,JANARDHAHA R. 07/07/2013 4:10 PM

## 2013-07-07 NOTE — Progress Notes (Signed)
D Anay is seen OOB UAL on the 500 hall today...tolerated fair. HE can be intrusive. HE has  flight of ideas, has some disorganized thinking and demonstrates diff staying on task. He is somewhat hypomanic .     A He is compliant with his POC. HE attends his groups. HE is engaged in the discussions, but has diff staying on task and following the discussions. HE completes his AM self inventory and on it he writes he denies  SI within the p-ast 24 hrs, he rates his depression and hopelessness " 0/0", respectively and he writes his DC [place]ps is to " finish college and  Become a Music therapist".    R Safety is in place and POC cont.

## 2013-07-07 NOTE — Progress Notes (Signed)
Psychoeducational Group Note  Date:  07/07/2013 Time:  0930  Group Topic/Focus:  Identifying Needs:   The focus of this group is to help patients identify their personal needs .  Participation Level:  Active  Participation Quality: good  Affect: flat  Cognitive:   Limited   Insight:  good  Engagement in Group: engaged  Additional Comments: PDuke RN [employer]

## 2013-07-07 NOTE — [facility] Group Notes (Signed)
[facility] LCSW Group Therapy  07/07/2013   3:00 PM   Type of Therapy:  Group Therapy  Participation Level:  Active  Participation Quality:  Appropriate and Attentive  Affect:  Appropriate and Calm  Cognitive:  Alert and Appropriate  Insight:  Developing/Improving and Engaged  Engagement in Therapy:  Developing/Improving and Engaged  Modes of Intervention:  Clarification, Confrontation, Discussion, Education, Exploration, Limit-setting, Orientation, Problem-solving, Rapport Building, Dance movement psychotherapist, Socialization and Support  Summary of Progress/Problems: The main focus of today's process group was for the patient to identify ways in which they have in the past sabotaged their own recovery and reasons they may have done this/what they received from doing it. We then worked to identify a specific plan to avoid doing this when discharged from the hospital for this admission. Pt shared that he likes to be in control and have everything planned out but being here he had to let go.  Pt states that he plans to pick up old hobbies, such as basketball and drawing as well as surround himself with positive supports to prevent self sabotaging behaviors.    John [name], LCSWA 07/07/2013 2:27 PM

## 2013-07-07 NOTE — Progress Notes (Signed)
Adult Psychoeducational Group Note  Date:  07/07/2013 Time:  2:14 PM  Group Topic/Focus:  Healthy Communication:   The focus of this group is to discuss communication, barriers to communication, as well as healthy ways to communicate with others.  Participation Level:  Active  Participation Quality:  Intrusive, Monopolizing and Sharing  Affect:  Appropriate  Cognitive:  Appropriate  Insight: Good, Intrusive  Engagement in Group:  Distracting, Engaged, Monopolizing and Supportive  Modes of Intervention:  Discussion, Education, Socialization and Support  Additional Comments:  Pt stated he needed to work on listening and hearing others when communicating in order to communicate in a healthy manner.  Caswell Corwin 07/07/2013, 2:14 PM

## 2013-07-07 NOTE — Progress Notes (Signed)
The focus of this group is to help patients review their daily goal of treatment and discuss progress on daily workbooks. Pt attended the evening group session and responded to all discussion prompts from the Writer. Pt stated that he had a good day, partly because of a therapeutic conversation he had with his daytime Nurse. Pt reported wanting to discharge tomorrow to take care of several things on the outside, but was unsure if this were a possibility. Pt requested earplugs and to shave, both of which were taken care of following the group session. Pt's affect was appropriate and the Writer observed the Pt making jokes/laughing in group.

## 2013-07-07 NOTE — Progress Notes (Signed)
Patient ID: John [name], male   DOB: 1986-09-13, 27 y.o.   MRN: [id number]  D: Patient lying in bed with eyes closed. Respirations even and non-labored. A: Staff will monitor on q 15 minutes, follow treatment plan, and give meds as ordered. R: Appears asleep.

## 2013-07-08 NOTE — Progress Notes (Signed)
Patient has been up and active on the unit, attended group this evening and has voiced no complaints. Patient currently denies having pain, -si/hi/a/v hall. Patient reports that he will possibly discharge on tomorrow. He reports that his mother came to visit today and he was glad to see her. His plan is to have his brother move out and he plans to help his mother with her bills. Patient plans to use his support which is his mother and continue to take his meds and enroll in school. Support and encouragement offered, safety maintained on unit, will continue to monitor.

## 2013-07-08 NOTE — Progress Notes (Signed)
Psychoeducational Group Note  Date:  07/08/2013 Time: 1015 Group Topic/Focus: The group focuses on helping patients identify and verbalize things they are thankful for in their lives.  Participation Level:  Active  Participation Quality:  Appropriate  Affect:  Appropriate  Cognitive:  Appropriate  Insight:  Engaged  Engagement in Group:  Distracting  Additional Comments:   John [name] 6:00 PM. 07/08/2013

## 2013-07-08 NOTE — [facility] Group Notes (Signed)
[facility] LCSW Group Therapy  07/08/2013   2:30 PM   Type of Therapy:  Group Therapy  Participation Level:  Active  Participation Quality:  Appropriate and Attentive  Affect:  Appropriate, Flat and Depressed  Cognitive:  Alert and Appropriate  Insight:  Developing/Improving and Engaged  Engagement in Therapy:  Developing/Improving and Engaged  Modes of Intervention:  Clarification, Confrontation, Discussion, Education, Exploration, Limit-setting, Orientation, Problem-solving, Rapport Building, Dance movement psychotherapist, Socialization and Support  Summary of Progress/Problems: The main focus of today's process group was to identify the patient's current support system and decide on other supports that can be put in place.  An emphasis was placed on using counselor, doctor, therapy groups, 12-step groups, and problem-specific support groups to expand supports, as well as doing something different than has been done before.   Pt came at the end of group but shared that he needs to add support here since he moved here.  Pt explained that he had a good support system in place prior to moving, of community support and a therapist but now has lost all of that.    John [name], LCSWA 07/08/2013 3:10 PM

## 2013-07-08 NOTE — Progress Notes (Signed)
Psychoeducational Group Note  Date:  07/08/2013 Time: 1015 Group Topic/Focus:  Making Healthy Choices:   The focus of this group is to help patients identify negative/unhealthy choices they were using prior to admission and identify positive/healthier coping strategies to replace them upon discharge.  Participation Level:  Minimal  Participation Quality:  Appropriate  Affect:  Labile  Cognitive:  Disorganized  Insight:  Limited  Engagement in Group:  Lacking  Additional Comments:    Rich Brave 2:56 PM. 07/08/2013

## 2013-07-08 NOTE — Progress Notes (Signed)
[name] remains disorganized, labile intrusive and anxious. HE has diff staying on track, as he talks. HE demonstrates  tangential , racing thinking. He takes his medications as ordered. HE attends his groups, and while he has diff paying attention and staying on task with the group discussion, he is engaged in the group.  A He completed is AM self inventory and denied SI within the past 24 hrs.  R Safety is in place and POC cont.

## 2013-07-08 NOTE — Progress Notes (Signed)
Writer spoke with patient at the medication window 1:1 and he reports having had a good day and appears to be in a good mood. Patient voiced no complaints and requested his scheduled medications which he received. Patient attended group this evening and participated. Patient denies si/hi/a/v hallucinations. Patient requested his finger be covered before taking an shower. Patient explained how his puppy pitbull bit him on his finger. Support and encouragement offered, finger covered, safety maintained on unit with 15 min checks, will continue to monitor.

## 2013-07-08 NOTE — Progress Notes (Signed)
Patient ID: John [name], male   DOB: 12/04/1986, 27 y.o.   MRN: [id number] [facility] MD Progress Note  07/08/2013 1:37 PM John [name]  MRN:  [id number]  Subjective:  John [name] has been doing well and has been compliant with his treatment program. He has more organized and able to express his thought better. He continue to have mild symptoms of mania and increased goal directed activity like working physical exercise and does not want his medication interrupt it. He has fair to poor insight. His mother has been supportive to him.   Diagnosis: psychosis NOS  ADL's:  Intact  Sleep: Good  Appetite:  Good  Suicidal Ideation:  denies Homicidal Ideation:  denies AEB (as evidenced by):   Psychiatric Specialty Exam: Review of Systems  Constitutional: Negative.  Negative for fever, chills, weight loss, malaise/fatigue and diaphoresis.  HENT: Negative for congestion and sore throat.   Eyes: Negative for blurred vision, double vision and photophobia.  Respiratory: Negative for cough, shortness of breath and wheezing.   Cardiovascular: Negative for chest pain, palpitations and PND.  Gastrointestinal: Negative for heartburn, nausea, vomiting, abdominal pain, diarrhea and constipation.  Musculoskeletal: Negative for myalgias, joint pain and falls.  Neurological: Negative for dizziness, tingling, tremors, sensory change, speech change, focal weakness, seizures, loss of consciousness, weakness and headaches.  Endo/Heme/Allergies: Negative for polydipsia. Does not bruise/bleed easily.  Psychiatric/Behavioral: Negative for depression, suicidal ideas, hallucinations, memory loss and substance abuse. The patient is not nervous/anxious and does not have insomnia.     Blood pressure 144/94, pulse 69, temperature 98.2 F (36.8 C), temperature source Oral, resp. rate 17, height 6\' 4"  (1.93 m), weight 79.379 kg (175 lb).Body mass index is 21.31 kg/(m^2).  General Appearance: Casual  Eye Contact::  Good  Speech:   Clear and Coherent  Volume:  Normal  Mood:  Anxious and Depressed  Affect:  Congruent  Thought Process:  Circumstantial  Orientation:  Full (Time, Place, and Person)  Thought Content:  Paranoid with some delusions  Suicidal Thoughts:  Yes.  without intent/plan  Homicidal Thoughts:  No  Memory:  Immediate;   Fair  Judgement:  Intact  Insight:  Lacking  Psychomotor Activity:  Normal  Concentration:  Fair  Recall:  Fair  Akathisia:  No  Handed:  Right  AIMS (if indicated):     Assets:  Communication Skills Desire for Improvement Housing Physical Health Social Support  Sleep:  Number of Hours: 3.75   Current Medications: Current Facility-Administered Medications  Medication Dose Route Frequency Provider Last Rate Last Dose  . acetaminophen (TYLENOL) tablet 650 mg  650 mg Oral Q6H PRN John Hough, PA-C      . alum & mag hydroxide-simeth (MAALOX/MYLANTA) 200-200-20 MG/5ML suspension 30 mL  30 mL Oral Q4H PRN John Hough, PA-C      . divalproex (DEPAKOTE) DR tablet 500 mg  500 mg Oral Q12H John Settle, MD   500 mg at 07/06/13 0816  . hydrOXYzine (ATARAX/VISTARIL) tablet 50 mg  50 mg Oral BID John Settle, MD   50 mg at 07/06/13 0815  . magnesium hydroxide (MILK OF MAGNESIA) suspension 30 mL  30 mL Oral Daily PRN John Hough, PA-C      . traZODone (DESYREL) tablet 100 mg  100 mg Oral QHS John Settle, MD   100 mg at 07/05/13 2253    Lab Results:  Results for orders placed during the hospital encounter of 07/03/13 (from the past 48 hour(s))  VALPROIC ACID  LEVEL     Status: None   Collection Time    07/07/13  6:43 AM      Result Value Range   Valproic Acid Lvl 58.8  50.0 - 100.0 ug/mL  VALPROIC ACID LEVEL     Status: None   Collection Time    07/08/13  6:42 AM      Result Value Range   Valproic Acid Lvl 82.3  50.0 - 100.0 ug/mL    Physical Findings: AIMS: Facial and Oral Movements Muscles of Facial Expression: None,  normal Lips and Perioral Area: None, normal Jaw: None, normal Tongue: None, normal,Extremity Movements Upper (arms, wrists, hands, fingers): None, normal Lower (legs, knees, ankles, toes): None, normal, Trunk Movements Neck, shoulders, hips: None, normal, Overall Severity Severity of abnormal movements (highest score from questions above): None, normal Incapacitation due to abnormal movements: None, normal Patient's awareness of abnormal movements (rate only patient's report): No Awareness, Dental Status Current problems with teeth and/or dentures?: No Does patient usually wear dentures?: No  CIWA:  CIWA-Ar Total: 2 COWS:  COWS Total Score: 0  Treatment Plan Summary: Daily contact with patient to assess and evaluate symptoms and progress in treatment Medication management  Plan: 1. Continue crisis management and stabilization. 2. Medication management to reduce current symptoms to base line and improve patient's overall level of functioning 3. Treat health problems as indicated. 4. Develop treatment plan to decrease risk of relapse upon discharge and the need for readmission. 5. Psycho-social education regarding relapse prevention and self care. 6. Health care follow up as needed for medical problems. 7. Continue home medications where appropriate. 8.Continue current plan of care with plans for depakote level on Sunday AM. 9. Changed Trazodone to prn. 10. ELOS:2-3 days. 11. Due to his paranoia, delusions and disorganization discharge is not appropriate at this time.  12. Will follow through and if needed consider adding an antipsychotic.  Medical Decision Making Problem Points:  Established problem, stable/improving (1) Data Points:  Review or order clinical lab tests (1)  I certify that inpatient services furnished can reasonably be expected to improve the patient's condition.    Kimyatta [name],John R. 07/08/2013 1:37 PM

## 2013-07-09 DIAGNOSIS — F3130 Bipolar disorder, current episode depressed, mild or moderate severity, unspecified: Principal | ICD-10-CM

## 2013-07-09 MED ORDER — DIVALPROEX SODIUM 500 MG PO DR TAB: 500.0000 mg | DELAYED_RELEASE_TABLET | Freq: Two times a day (BID) | ORAL | Status: DC

## 2013-07-09 MED ORDER — TRAZODONE HCL 100 MG PO TABS: 100.0000 mg | ORAL_TABLET | Freq: Every evening | ORAL | Status: DC | PRN

## 2013-07-09 MED ORDER — HYDROXYZINE HCL 50 MG PO TABS: 50.0000 mg | ORAL_TABLET | Freq: Two times a day (BID) | ORAL | Status: DC

## 2013-07-09 MED ORDER — ADULT MULTIVITAMIN W/MINERALS CH: 1.0000 | ORAL_TABLET | Freq: Every day | ORAL | Status: DC
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
## 2013-07-09 NOTE — Clinical Social Work Note (Signed)
Writer spoke with [employer], patient's girlfriend, at [phone], to advise her patient was having HI on admission and being discharged today.  She stated she was very grateful for the call.

## 2013-07-09 NOTE — Tx Team (Signed)
Interdisciplinary Treatment Plan Update   Date Reviewed:  07/09/2013  Time Reviewed:  8:39 AM  Progress in Treatment:   Attending groups: Yes Participating in groups: Yes Taking medication as prescribed: Yes  Tolerating medication: Yes Family/Significant other contact made: Yes, contact made with mother  Patient understands diagnosis: Yes  Discussing patient identified problems/goals with staff: Yes Medical problems stabilized or resolved: Yes Denies suicidal/homicidal ideation: Yes Patient has not harmed self or others: Yes  For review of initial/current patient goals, please see plan of care.  Estimated Length of Stay:  Discharge home today  Reasons for Continued Hospitalization:   New Problems/Goals identified:    Discharge Plan or Barriers:   Home with outpatient follow up with [facility]  Additional Comments: Girlfriend notified that patient was HI toward her on admission but not on discharge.  Attendees:  Patient:   John [name] 07/09/2013 8:39 AM   Signature: Mervyn Gay, MD 07/09/2013 8:39 AM  Signature:  Silverio Decamp, PA 07/09/2013 8:39 AM  Signature: Neill Loft, RN 07/09/2013 8:39 AM  Signature:  Quintella Reichert, RN 07/09/2013 8:39 AM  Signature:  Neill Loft RN 07/09/2013 8:39 AM  Signature:  Juline Patch, LCSW 07/09/2013 8:39 AM  Signature:  Reyes Ivan, LCSW 07/09/2013 8:39 AM  Signature:   07/09/2013 8:39 AM  Signature: 07/09/2013 8:39 AM  Signature:    Signature:    Signature:      Scribe for Treatment Team:   Juline Patch,  07/09/2013 8:39 AM

## 2013-07-09 NOTE — [facility] Group Notes (Signed)
[facility] LCSW Aftercare Discharge Planning Group Note   07/09/2013 10:09 AM  Participation Quality:  Appropriate  Mood/Affect:  Appropriate  Depression Rating:  0  Anxiety Rating:  0  Thoughts of Suicide:  No  Will you contract for safety?   NA  Current AVH:  No  Plan for Discharge/Comments:  Patient attending discharge planning group and actively participated in group.  Patient reports doing well and ready to discharge home today.  He will follow up with [facility].  CSW provided all participants with daily workbook and information on services offered by [facility].   Transportation Means: Patient has transportation.   Supports:  Patient has a good support system.   Adnan Vanvoorhis, [name]

## 2013-07-09 NOTE — Progress Notes (Signed)
Grief & Loss Group  Group members processed their experience and emotions related to grief and loss.   John [name] entered group twenty minutes before the group ended. John [name] participated actively in group and shared the loss of his grandmother. John [name] provided support to another group member by discussing his emotions and coping strategies that have worked for him.   Sherol Dade Counselor Intern Haroldine Laws

## 2013-07-09 NOTE — Progress Notes (Signed)
[facility] Adult Case Management Discharge Plan :  Will you be returning to the same living situation after discharge: Yes,  Patient is returning to his home. At discharge, do you have transportation home? Yes, Patient has transportation. Do you have the ability to pay for your medications:Yes,  Patient able to obtain medications. or No.  Release of information consent forms completed and in the chart;  Patient's signature needed at discharge.  Patient to Follow up at: Follow-up Information   Follow up with [facility] On 07/11/2013. (Please go to [facility]'s walk in clinic on Wednesday, July 11, 2013 between 8AM-3PM for medication managment)    Contact information:   [address] [place], [place]   [address]  [phone]      Patient denies SI/HI:   Patient no longer endorsing SI/HI or other thoughts of self harm.     Safety Planning and Suicide Prevention discussed:  .Reviewed with all patients during discharge planning group  John [name], John [name] July 07/09/2013, 9:58 AM

## 2013-07-09 NOTE — [facility] Suicide Risk Assessment (Signed)
Suicide Risk Assessment  Discharge Assessment     Demographic Factors:  Male and Adolescent or young adult  Mental Status Per Nursing Assessment::   On Admission:     Current Mental Status by Physician: In full contact with reality. There are no suicidal ideas, plans or intent. His mood is euthymic. His affect is appropriate. He states he is not gong to go back to the relationship that he says triggered him being here. 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 He is planning to go back to [employer] this Fall Semester   Loss Factors: Loss of significant relationship  Historical Factors: NA  Risk Reduction Factors:   Sense of responsibility to family, Positive social support and Positive coping skills or problem solving skills  Continued Clinical Symptoms:  Depression:   Comorbid alcohol abuse/dependence  Cognitive Features That Contribute To Risk: None Identified      Suicide Risk:  Minimal: No identifiable suicidal ideation.  Patients presenting with no risk factors but with morbid ruminations; may be classified as minimal risk based on the severity of the depressive symptoms  Discharge Diagnoses:   AXIS I:  Bipolar, Depressed AXIS II:  Deferred AXIS III:   Past Medical History  Diagnosis Date  . Mental disorder    AXIS IV:  other psychosocial or environmental problems AXIS V:  61-70 mild symptoms  Plan Of Care/Follow-up recommendations:  Activity:  as tolerated Diet:  regular Follow up outpatient basis Is patient on multiple antipsychotic therapies at discharge:  No   Has Patient had three or more failed trials of antipsychotic monotherapy by history:  No  Recommended Plan for Multiple Antipsychotic Therapies: N/A   Carmichael Burdette A 07/09/2013, 12:43 PM

## 2013-07-12 NOTE — Progress Notes (Signed)
Patient Discharge Instructions:  After Visit Summary (AVS):   Faxed to:  07/12/13 Discharge Summary Note:   Faxed to:  07/12/13 Psychiatric Admission Assessment Note:   Faxed to:  07/12/13 Suicide Risk Assessment - Discharge Assessment:   Faxed to:  07/12/13 Faxed/Sent to the Next Level Care provider:  07/12/13 Faxed to [facility] @ [phone]  Jerelene Redden, 07/12/2013, 3:43 PM

## 2013-07-26 ENCOUNTER — Ambulatory Visit (INDEPENDENT_AMBULATORY_CARE_PROVIDER_SITE_OTHER): Payer: BC Managed Care – PPO | Admitting: Physician Assistant

## 2013-07-26 ENCOUNTER — Encounter: Payer: Self-pay | Admitting: Physician Assistant

## 2013-07-26 VITALS — BP 142/90 | HR 83 | Temp 98.9°F | Resp 16 | Ht 76.0 in | Wt 190.0 lb

## 2013-07-26 DIAGNOSIS — Z202 Contact with and (suspected) exposure to infections with a predominantly sexual mode of transmission: Secondary | ICD-10-CM

## 2013-07-26 DIAGNOSIS — Z0000 Encounter for general adult medical examination without abnormal findings: Secondary | ICD-10-CM

## 2013-07-26 DIAGNOSIS — F319 Bipolar disorder, unspecified: Secondary | ICD-10-CM

## 2013-07-26 DIAGNOSIS — Z9189 Other specified personal risk factors, not elsewhere classified: Secondary | ICD-10-CM

## 2013-07-26 LAB — HEPATIC FUNCTION PANEL
ALT: 21 U/L (ref 0–53)
Indirect Bilirubin: 0.3 mg/dL (ref 0.0–0.9)
Total Protein: 7 g/dL (ref 6.0–8.3)

## 2013-07-26 LAB — CBC WITH DIFFERENTIAL/PLATELET
Eosinophils Relative: 2 % (ref 0–5)
HCT: 42.7 % (ref 39.0–52.0)
Lymphocytes Relative: 28 % (ref 12–46)
Lymphs Abs: 1.9 10*3/uL (ref 0.7–4.0)
MCV: 85.9 fL (ref 78.0–100.0)
Monocytes Absolute: 0.5 10*3/uL (ref 0.1–1.0)
Monocytes Relative: 7 % (ref 3–12)
RBC: 4.97 MIL/uL (ref 4.22–5.81)
WBC: 6.7 10*3/uL (ref 4.0–10.5)

## 2013-07-26 LAB — LIPID PANEL
LDL Cholesterol: 75 mg/dL (ref 0–99)
Triglycerides: 41 mg/dL (ref <150)
VLDL: 8 mg/dL (ref 0–40)

## 2013-07-26 LAB — BASIC METABOLIC PANEL
BUN: 8 mg/dL (ref 6–23)
CO2: 31 mEq/L (ref 19–32)
Chloride: 103 mEq/L (ref 96–112)
Creat: 1.03 mg/dL (ref 0.50–1.35)
Glucose, Bld: 86 mg/dL (ref 70–99)

## 2013-07-26 NOTE — Assessment & Plan Note (Signed)
Referral to psychiatry for medication management and counseling.  Patient needs follow-up -- displays tangential speech and is hyper-focused on his ex-girlfriend.

## 2013-07-26 NOTE — Patient Instructions (Signed)
Please obtain labs today.  I will call you with the results.  I have made a referral to see a therapist/psychiatrist.  Their office will be contacting you.  Your blood pressure was a little high today.  Please read information below about low salt diet.  I will want to see you again in 6 months or sooner if you need me.  DASH Diet The DASH diet stands for "Dietary Approaches to Stop Hypertension." It is a healthy eating plan that has been shown to reduce high blood pressure (hypertension) in as little as 14 days, while also possibly providing other significant health benefits. These other health benefits include reducing the risk of breast cancer after menopause and reducing the risk of type 2 diabetes, heart disease, colon cancer, and stroke. Health benefits also include weight loss and slowing kidney failure in patients with chronic kidney disease.  DIET GUIDELINES  Limit salt (sodium). Your diet should contain less than 1500 mg of sodium daily.  Limit refined or processed carbohydrates. Your diet should include mostly whole grains. Desserts and added sugars should be used sparingly.  Include small amounts of heart-healthy fats. These types of fats include nuts, oils, and tub margarine. Limit saturated and trans fats. These fats have been shown to be harmful in the body. CHOOSING FOODS  The following food groups are based on a 2000 calorie diet. See your Registered Dietitian for individual calorie needs. Grains and Grain Products (6 to 8 servings daily)  Eat More Often: Whole-wheat bread, brown rice, whole-grain or wheat pasta, quinoa, popcorn without added fat or salt (air popped).  Eat Less Often: White bread, white pasta, white rice, cornbread. Vegetables (4 to 5 servings daily)  Eat More Often: Fresh, frozen, and canned vegetables. Vegetables may be raw, steamed, roasted, or grilled with a minimal amount of fat.  Eat Less Often/Avoid: Creamed or fried vegetables. Vegetables in a cheese  sauce. Fruit (4 to 5 servings daily)  Eat More Often: All fresh, canned (in natural juice), or frozen fruits. Dried fruits without added sugar. One hundred percent fruit juice ( cup [237 mL] daily).  Eat Less Often: Dried fruits with added sugar. Canned fruit in light or heavy syrup. [employer], Fish, and Poultry (2 servings or less daily. One serving is 3 to 4 oz [85-114 g]).  Eat More Often: Ninety percent or leaner ground beef, tenderloin, sirloin. Round cuts of beef, chicken breast, [place] breast. All fish. Grill, bake, or broil your meat. Nothing should be fried.  Eat Less Often/Avoid: Fatty cuts of meat, [place], or chicken leg, thigh, or wing. Fried cuts of meat or fish. Dairy (2 to 3 servings)  Eat More Often: Low-fat or fat-free milk, low-fat plain or light yogurt, reduced-fat or part-skim cheese.  Eat Less Often/Avoid: Milk (whole, 2%).Whole milk yogurt. Full-fat cheeses. Nuts, Seeds, and Legumes (4 to 5 servings per week)  Eat More Often: All without added salt.  Eat Less Often/Avoid: Salted nuts and seeds, canned beans with added salt. Fats and Sweets (limited)  Eat More Often: Vegetable oils, tub margarines without trans fats, sugar-free gelatin. Mayonnaise and salad dressings.  Eat Less Often/Avoid: Coconut oils, palm oils, butter, stick margarine, cream, half and half, cookies, candy, pie. FOR MORE INFORMATION The Dash Diet Eating Plan: www.dashdiet.org Document Released: 11/18/2011 Document Revised: 02/21/2012 Document Reviewed: 11/18/2011 [facility] Patient Information 2014 [place], [place].

## 2013-07-26 NOTE — Assessment & Plan Note (Signed)
Patient to obtain fasting labs -- CBC, BMP, Hepatic Function Panel, TSH, Lipid Profile

## 2013-07-26 NOTE — Progress Notes (Signed)
Patient ID: John [name], male   DOB: [birth year], 27 y.o.   MRN: [id number]  Patient is a 27 year-old african [place] gentleman who presents to clinic today to establish care.  Health Maintenance: (1) Annual Eye Exam -- last exam 2012 (2) Dental Exam -- last exam 2012 (3) Immunizations -- patient states immunizations up-to-date  Patient is requesting change in medication.  Patient was recently admitted to [facility] for treatment of newly diagnosed Bipolar disorder.  Patient was endorsing suicidal and homicidal ideations.  Patient states he has been doing well with discharge.  Denies homicidal/suicidal ideations.  Endorses taking medication as prescribed.  States he was supposed to follow-up but the place he was referred to does not accept his [employer].  States he feels that his medication should be lowered or be discontinued.    Patient also expresses concern for potential of STD exposure.  Endorses unprotected sex with his girlfriend who he states was unfaithful.  Denies symptoms -- dysuria, penile discharge, lesions, testicular pain or swelling, rash.  Denies fever, chills, malaise.  Past Medical History  Diagnosis Date  . Mental disorder    Current Outpatient Prescriptions on File Prior to Visit  Medication Sig Dispense Refill  . divalproex (DEPAKOTE) 500 MG DR tablet Take 1 tablet (500 mg total) by mouth every 12 (twelve) hours.  60 tablet  0  . Multiple Vitamin (MULTIVITAMIN WITH MINERALS) TABS Take 1 tablet by mouth daily.  30 tablet  0   No current facility-administered medications on file prior to visit.   No Known Allergies  No family history on file.  History   Social History  . Marital Status: Single    Spouse Name: N/A    Number of Children: N/A  . Years of Education: N/A   Social History Main Topics  . Smoking status: Current Every Day Smoker    Types: Cigarettes  . Smokeless tobacco: None  . Alcohol Use: No  . Drug Use: Yes    Special: Marijuana  . Sexual  Activity: None   Other Topics Concern  . None   Social History Narrative  . None   Review of Systems  Constitutional: Positive for malaise/fatigue. Negative for fever, chills and weight loss.  HENT: Negative for hearing loss, ear pain, tinnitus and ear discharge.   Eyes: Negative for blurred vision, double vision, photophobia and pain.  Respiratory: Negative for cough, hemoptysis, sputum production, shortness of breath and wheezing.   Cardiovascular: Positive for palpitations. Negative for chest pain.  Gastrointestinal: Negative for heartburn, nausea, vomiting, abdominal pain, diarrhea, constipation, blood in stool and melena.  Genitourinary: Negative for dysuria, urgency, frequency, hematuria and flank pain.  Musculoskeletal: Negative for myalgias.  Neurological: Negative for seizures, loss of consciousness and headaches.  Endo/Heme/Allergies: Positive for environmental allergies.  Psychiatric/Behavioral: Positive for suicidal ideas and substance abuse. The patient is nervous/anxious.        Recent diagnosis of Bipolar I -- SIs, HIs, anxiety and insomnia.   No symptoms after discharge from [facility]   Filed Vitals:   07/26/13 1032  BP: 142/90  Pulse: 83  Temp: 98.9 F (37.2 C)  Resp: 16   Physical Exam  Vitals reviewed. Constitutional: He is oriented to person, place, and time and well-developed, well-nourished, and in no distress.  HENT:  Head: Normocephalic and atraumatic.  Right Ear: External ear normal.  Left Ear: External ear normal.  Nose: Nose normal.  Mouth/Throat: Oropharynx is clear and moist.  Eyes: Conjunctivae and EOM are normal. Pupils  are equal, round, and reactive to light.  Neck: Normal range of motion. Neck supple.  Cardiovascular: Normal rate, regular rhythm, normal heart sounds and intact distal pulses.   Pulmonary/Chest: Effort normal and breath sounds normal. No respiratory distress. He has no wheezes. He has no rales. He exhibits no tenderness.  Abdominal:  Soft. Bowel sounds are normal. He exhibits no distension and no mass. There is no tenderness. There is no rebound and no guarding.  Genitourinary: Penis normal. No discharge found.  Musculoskeletal: Normal range of motion.  Lymphadenopathy:    He has no cervical adenopathy.  Neurological: He is alert and oriented to person, place, and time. He has normal reflexes. No cranial nerve deficit. Gait normal.  Skin: Skin is warm and dry. No rash noted.    Assessment/Plan: Bipolar 1 disorder Referral to psychiatry for medication management and counseling.  Patient needs follow-up -- displays tangential speech and is hyper-focused on his ex-girlfriend.    Encounter for preventive health examination Patient to obtain fasting labs -- CBC, BMP, Hepatic Function Panel, TSH, Lipid Profile  Potential exposure to STD Patient asymptomatic.  Obtain STD Panel -- HIV Ab, RPR, Urine G/C.

## 2013-07-26 NOTE — Assessment & Plan Note (Signed)
Patient asymptomatic.  Obtain STD Panel -- HIV Ab, RPR, Urine G/C.

## 2013-07-27 LAB — TSH: TSH: 4.457 u[IU]/mL (ref 0.350–4.500)

## 2013-07-27 LAB — HIV ANTIBODY (ROUTINE TESTING W REFLEX): HIV: NONREACTIVE

## 2013-08-02 ENCOUNTER — Ambulatory Visit (HOSPITAL_COMMUNITY): Payer: No Typology Code available for payment source | Admitting: Psychiatry

## 2013-08-23 ENCOUNTER — Encounter (HOSPITAL_COMMUNITY): Payer: Self-pay | Admitting: Psychiatry

## 2013-08-23 ENCOUNTER — Ambulatory Visit (INDEPENDENT_AMBULATORY_CARE_PROVIDER_SITE_OTHER): Payer: No Typology Code available for payment source | Admitting: Psychiatry

## 2013-08-23 VITALS — BP 152/91 | HR 80 | Ht 76.0 in | Wt 194.0 lb

## 2013-08-23 DIAGNOSIS — F1210 Cannabis abuse, uncomplicated: Secondary | ICD-10-CM

## 2013-08-23 DIAGNOSIS — F1994 Other psychoactive substance use, unspecified with psychoactive substance-induced mood disorder: Secondary | ICD-10-CM
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
## 2013-09-10 ENCOUNTER — Ambulatory Visit (HOSPITAL_COMMUNITY): Payer: Self-pay | Admitting: Psychiatry

## 2013-11-09 ENCOUNTER — Encounter: Payer: Self-pay | Admitting: Physician Assistant

## 2014-01-29 ENCOUNTER — Ambulatory Visit: Payer: Self-pay | Admitting: Physician Assistant

## 2015-12-14 DIAGNOSIS — E079 Disorder of thyroid, unspecified: Secondary | ICD-10-CM

## 2015-12-14 HISTORY — DX: Disorder of thyroid, unspecified: E07.9

## 2016-07-12 ENCOUNTER — Encounter (HOSPITAL_COMMUNITY): Payer: Self-pay | Admitting: *Deleted

## 2016-07-12 ENCOUNTER — Ambulatory Visit: Payer: Self-pay

## 2016-07-12 ENCOUNTER — Ambulatory Visit (HOSPITAL_COMMUNITY)
Admission: EM | Admit: 2016-07-12 | Discharge: 2016-07-12 | Disposition: A | Discharge status: Home / Self Care | Payer: Self-pay | Attending: Family Medicine | Admitting: Family Medicine

## 2016-07-12 DIAGNOSIS — T148 Other injury of unspecified body region: Secondary | ICD-10-CM

## 2016-07-12 DIAGNOSIS — L089 Local infection of the skin and subcutaneous tissue, unspecified: Secondary | ICD-10-CM

## 2016-07-12 DIAGNOSIS — W57XXXA Bitten or stung by nonvenomous insect and other nonvenomous arthropods, initial encounter: Secondary | ICD-10-CM

## 2016-07-12 MED ORDER — DOXYCYCLINE HYCLATE 100 MG PO CAPS: 100.0000 mg | ORAL_CAPSULE | Freq: Two times a day (BID) | ORAL | 0 refills | Status: DC

## 2016-07-12 MED ORDER — FLUTICASONE PROPIONATE 0.05 % EX CREA: TOPICAL_CREAM | Freq: Two times a day (BID) | CUTANEOUS | 0 refills | Status: DC

## 2016-07-12 NOTE — Discharge Instructions (Signed)
Warm compress then cream and antibiotic

## 2016-07-12 NOTE — ED Triage Notes (Signed)
Pt  States        Was    Stung  On  His   Abdomen    sev  Days   Ago   By  Isla Pence     He  Has  Pain  Redness  At  The   Site    No  Systemic involvement   Sitting  Upright   On  Exam

## 2016-07-12 NOTE — ED Provider Notes (Signed)
[facility]    CSN: [id number] Arrival date & time: 07/12/16  1140  First Provider Contact:  None       History   Chief Complaint Chief Complaint  Patient presents with  . Insect Bite    HPI John [name] is a 30 y.o. male.   The history is provided by the patient.  Rash  Location:  Torso Torso rash location:  Abd LUQ Quality: itchiness, painful, redness and swelling   Pain details:    Quality:  Itching and sore   Severity:  Mild   Onset quality:  Sudden   Duration:  3 days Severity:  Mild Progression:  Worsening Chronicity:  New Context: insect bite/sting   Relieved by:  None tried Ineffective treatments:  None tried Associated symptoms: no fever     Past Medical History:  Diagnosis Date  . Mental disorder     Patient Active Problem List   Diagnosis Date Noted  . Encounter for preventive health examination 07/26/2013  . Potential exposure to STD 07/26/2013  . Cannabis abuse 07/06/2013  . Bipolar 1 disorder (HCC) 07/03/2013    History reviewed. No pertinent surgical history.     Home Medications    Prior to Admission medications   Medication Sig Start Date End Date Taking? Authorizing Provider  divalproex (DEPAKOTE) 500 MG DR tablet Take 1 tablet (500 mg total) by mouth every 12 (twelve) hours. 07/09/13   Charm Rings, NP  hydrOXYzine (ATARAX/VISTARIL) 50 MG tablet Take 50 mg by mouth 2 (two) times daily as needed. 07/09/13   Charm Rings, NP  Multiple Vitamin (MULTIVITAMIN WITH MINERALS) TABS Take 1 tablet by mouth daily. 07/09/13   Charm Rings, NP  Nutritional Supplements (COMPLETE NUTRITION) LIQD Take by mouth daily.    Historical Provider, MD    Family History History reviewed. No pertinent family history.  Social History Social History  Substance Use Topics  . Smoking status: Current Every Day Smoker    Types: Cigarettes  . Smokeless tobacco: Not on file  . Alcohol use No     Allergies   Review of patient's  allergies indicates no known allergies.   Review of Systems Review of Systems  Constitutional: Negative for fever.  Skin: Positive for rash.  All other systems reviewed and are negative.    Physical Exam Triage Vital Signs ED Triage Vitals  Enc Vitals Group     BP 07/12/16 1243 158/92     Pulse Rate 07/12/16 1243 78     Resp 07/12/16 1243 18     Temp 07/12/16 1243 98.6 F (37 C)     Temp Source 07/12/16 1243 Oral     SpO2 07/12/16 1243 100 %     Weight --      Height --      Head Circumference --      Peak Flow --      Pain Score 07/12/16 1245 5     Pain Loc --      Pain Edu? --      Excl. in GC? --    No data found.   Updated Vital Signs BP 158/92 (BP Location: Right Arm)   Pulse 78   Temp 98.6 F (37 C) (Oral)   Resp 18   SpO2 100%   Visual Acuity Right Eye Distance:   Left Eye Distance:   Bilateral Distance:    Right Eye Near:   Left Eye Near:    Bilateral Near:  Physical Exam  Constitutional: He is oriented to person, place, and time. He appears well-developed and well-nourished. He appears distressed.  Abdominal: Soft. There is tenderness.  Neurological: He is alert and oriented to person, place, and time.  Skin: Skin is warm and dry. There is erythema.  3cm nonfluctuant circular erythema lesion to left abdomen.no drainage.  Nursing note and vitals reviewed.    UC Treatments / Results  Labs (all labs ordered are listed, but only abnormal results are displayed) Labs Reviewed - No data to display  EKG  EKG Interpretation None       Radiology No results found.  Procedures Procedures (including critical care time)  Medications Ordered in UC Medications - No data to display   Initial Impression / Assessment and Plan / UC Course  I have reviewed the triage vital signs and the nursing notes.  Pertinent labs & imaging results that were available during my care of the patient were reviewed by me and considered in my medical decision  making (see chart for details).  Clinical Course      Final Clinical Impressions(s) / UC Diagnoses   Final diagnoses:  None    New Prescriptions New Prescriptions   No medications on file     Linna Hoff, MD 07/12/16 1337

## 2017-01-09 ENCOUNTER — Encounter (HOSPITAL_COMMUNITY): Payer: Self-pay | Admitting: Emergency Medicine

## 2017-01-09 ENCOUNTER — Emergency Department (HOSPITAL_COMMUNITY)
Admission: EM | Admit: 2017-01-09 | Discharge: 2017-01-12 | Payer: Managed Care, Other (non HMO) | Attending: Emergency Medicine | Admitting: Emergency Medicine

## 2017-01-09 DIAGNOSIS — F312 Bipolar disorder, current episode manic severe with psychotic features: Secondary | ICD-10-CM | POA: Diagnosis not present

## 2017-01-09 DIAGNOSIS — F329 Major depressive disorder, single episode, unspecified: Secondary | ICD-10-CM | POA: Insufficient documentation

## 2017-01-09 DIAGNOSIS — F3131 Bipolar disorder, current episode depressed, mild: Secondary | ICD-10-CM | POA: Diagnosis present

## 2017-01-09 DIAGNOSIS — R45851 Suicidal ideations: Secondary | ICD-10-CM | POA: Diagnosis present

## 2017-01-09 DIAGNOSIS — Z79899 Other long term (current) drug therapy: Secondary | ICD-10-CM | POA: Insufficient documentation

## 2017-01-09 DIAGNOSIS — F17210 Nicotine dependence, cigarettes, uncomplicated: Secondary | ICD-10-CM | POA: Diagnosis not present

## 2017-01-09 DIAGNOSIS — F1210 Cannabis abuse, uncomplicated: Secondary | ICD-10-CM | POA: Diagnosis not present

## 2017-01-09 LAB — COMPREHENSIVE METABOLIC PANEL
ALK PHOS: 56 U/L (ref 38–126)
ALT: 17 U/L (ref 17–63)
ANION GAP: 12 (ref 5–15)
AST: 27 U/L (ref 15–41)
Albumin: 4.6 g/dL (ref 3.5–5.0)
BUN: 16 mg/dL (ref 6–20)
CALCIUM: 9.4 mg/dL (ref 8.9–10.3)
CO2: 23 mmol/L (ref 22–32)
Chloride: 100 mmol/L — ABNORMAL LOW (ref 101–111)
Creatinine, Ser: 1.47 mg/dL — ABNORMAL HIGH (ref 0.61–1.24)
GFR calc non Af Amer: >60 mL/min (ref >60)
Glucose, Bld: 137 mg/dL — ABNORMAL HIGH (ref 65–99)
Potassium: 3.3 mmol/L — ABNORMAL LOW (ref 3.5–5.1)
SODIUM: 135 mmol/L (ref 135–145)
TOTAL PROTEIN: 7.2 g/dL (ref 6.5–8.1)
Total Bilirubin: 0.9 mg/dL (ref 0.3–1.2)

## 2017-01-09 LAB — URINALYSIS, ROUTINE W REFLEX MICROSCOPIC
Bilirubin Urine: NEGATIVE
Glucose, UA: NEGATIVE mg/dL
Hgb urine dipstick: NEGATIVE
KETONES UR: 20 mg/dL — AB
LEUKOCYTES UA: NEGATIVE
NITRITE: NEGATIVE
PROTEIN: NEGATIVE mg/dL
Specific Gravity, Urine: 1.009 (ref 1.005–1.030)
pH: 6 (ref 5.0–8.0)

## 2017-01-09 LAB — RAPID URINE DRUG SCREEN, HOSP PERFORMED
Amphetamines: NOT DETECTED
BENZODIAZEPINES: NOT DETECTED
Barbiturates: NOT DETECTED
COCAINE: NOT DETECTED
Opiates: NOT DETECTED
Tetrahydrocannabinol: POSITIVE — AB

## 2017-01-09 LAB — CBC WITH DIFFERENTIAL/PLATELET
BASOS ABS: 0 10*3/uL (ref 0.0–0.1)
BASOS PCT: 0 %
EOS PCT: 0 %
Eosinophils Absolute: 0 10*3/uL (ref 0.0–0.7)
HCT: 42 % (ref 39.0–52.0)
Hemoglobin: 15 g/dL (ref 13.0–17.0)
Lymphocytes Relative: 18 %
Lymphs Abs: 1.5 10*3/uL (ref 0.7–4.0)
MCH: 30.3 pg (ref 26.0–34.0)
MCHC: 35.7 g/dL (ref 30.0–36.0)
MCV: 84.8 fL (ref 78.0–100.0)
MONO ABS: 0.5 10*3/uL (ref 0.1–1.0)
Monocytes Relative: 6 %
NEUTROS ABS: 6.6 10*3/uL (ref 1.7–7.7)
Neutrophils Relative %: 76 %
PLATELETS: 264 10*3/uL (ref 150–400)
RBC: 4.95 MIL/uL (ref 4.22–5.81)
RDW: 13 % (ref 11.5–15.5)
WBC: 8.7 10*3/uL (ref 4.0–10.5)

## 2017-01-09 LAB — ETHANOL

## 2017-01-09 LAB — LIPASE, BLOOD: Lipase: 20 U/L (ref 11–51)

## 2017-01-09 LAB — ACETAMINOPHEN LEVEL: Acetaminophen (Tylenol), Serum: <10 ug/mL — ABNORMAL LOW (ref 10–30)

## 2017-01-09 LAB — SALICYLATE LEVEL

## 2017-01-09 MED ORDER — LORAZEPAM 1 MG PO TABS
1.0000 mg | ORAL_TABLET | Freq: Once | ORAL | Status: AC
  Administered 2017-01-09: 1 mg via ORAL
  Filled 2017-01-09: qty 1

## 2017-01-09 MED ORDER — ACETAMINOPHEN 325 MG PO TABS
650.0000 mg | ORAL_TABLET | Freq: Once | ORAL | Status: AC
  Administered 2017-01-09: 650 mg via ORAL
  Filled 2017-01-09: qty 2

## 2017-01-09 NOTE — Progress Notes (Signed)
01-09-17: Referral submitted to  [facility], [facility], [facility], [facility], [facility], [address], [place], [place], [place], [facility], [facility], [facility], [place] [facility], [facility], [facility]  Tamberlyn Midgley K. Sherlon HandingHarris, LCAS-A, LPC-A, [facility]  Counselor 01/09/2017 8:42 PM

## 2017-01-09 NOTE — ED Notes (Signed)
Hourly rounding reveals patient in room. No complaints, stable, in no acute distress. Q15 minute rounds and monitoring via Security Cameras to continue. 

## 2017-01-09 NOTE — BH Assessment (Signed)
Assessment completed. Consulted with Nanine Meansjamison Lord, DNP who recommends inpatient treatment.   Davina PokeJoVea Giordan [place], LCSW Therapeutic Triage Specialist [facility] 01/09/2017 5:06 PM
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
## 2017-01-09 NOTE — ED Notes (Addendum)
Pt. Being interviewed by pharmacy tech.

## 2017-01-09 NOTE — BH Assessment (Signed)
Dr. Molly Maduroobert lockwood completed IVC and first opinion for patient. Original paperwork filed in IVC book and a copy is placed in patients chart. IVc confirmed by magistrate Lung at 6:20PM.    Davina PokeJoVea Brecken [place], LCSW Therapeutic Triage Specialist [facility] 01/09/2017 6:05 PM

## 2017-01-09 NOTE — ED Notes (Signed)
Hourly rounding reveals patient sleeping in room. No complaints, stable, in no acute distress. Q15 minute rounds and monitoring via Security Cameras to continue. 

## 2017-01-09 NOTE — Progress Notes (Addendum)
Contacted pt. RN [name], will send IVC paper to on-base fax [phone]. Referral packet to be submitted. John [name], LCAS-A, LPC-A, [facility]  Counselor 01/09/2017 7:54 PM

## 2017-01-09 NOTE — ED Triage Notes (Signed)
Pt told Dr Jacqulyn BathLong "I've beneing smoking "Weed" like crazy."

## 2017-01-09 NOTE — ED Notes (Signed)
He was brought in by police. My colleagues provided his care and as I write this he has just received some oral Ativan and is quiet and resting in the presence of [employer] Officers. Also, he allowed us to draw his blood for testing.

## 2017-01-09 NOTE — ED Notes (Signed)
Pt. Transferred to [facility] from ED to room 37 after screening for contraband. Report to include Situation, Background, Assessment and Recommendations from Oscar RN. Pt. Oriented to unit including Q15 minute rounds as well as the security cameras for their protection. Patient is alert and oriented, warm and dry in no acute distress. Patient denies SI, HI, and AVH. Pt. Encouraged to let me know if needs arise. 

## 2017-01-09 NOTE — ED Notes (Signed)
Pt transferred to [facility] rm 37
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
## 2017-01-10 DIAGNOSIS — F3131 Bipolar disorder, current episode depressed, mild: Secondary | ICD-10-CM | POA: Diagnosis present

## 2017-01-10 DIAGNOSIS — F1210 Cannabis abuse, uncomplicated: Secondary | ICD-10-CM | POA: Diagnosis not present

## 2017-01-10 DIAGNOSIS — F312 Bipolar disorder, current episode manic severe with psychotic features: Secondary | ICD-10-CM | POA: Diagnosis not present

## 2017-01-10 DIAGNOSIS — Z79899 Other long term (current) drug therapy: Secondary | ICD-10-CM | POA: Diagnosis not present

## 2017-01-10 DIAGNOSIS — F17210 Nicotine dependence, cigarettes, uncomplicated: Secondary | ICD-10-CM

## 2017-01-10 MED ORDER — HYDROXYZINE HCL 25 MG PO TABS
25.0000 mg | ORAL_TABLET | Freq: Three times a day (TID) | ORAL | Status: DC
  Administered 2017-01-10 (×2): 25 mg via ORAL
  Filled 2017-01-10 (×4): qty 1

## 2017-01-10 MED ORDER — DIVALPROEX SODIUM 500 MG PO DR TAB
500.0000 mg | DELAYED_RELEASE_TABLET | Freq: Two times a day (BID) | ORAL | Status: DC
  Administered 2017-01-10 (×2): 500 mg via ORAL
  Filled 2017-01-10 (×4): qty 1

## 2017-01-10 MED ORDER — CLONIDINE HCL 0.1 MG PO TABS
0.1000 mg | ORAL_TABLET | Freq: Every day | ORAL | Status: DC
  Administered 2017-01-10 – 2017-01-12 (×3): 0.1 mg via ORAL
  Filled 2017-01-10 (×3): qty 1
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
## 2017-01-10 NOTE — ED Notes (Signed)
Pt awake & responsive, no distress noted, calm & cooperative.  Watching TV at present.  Monitoring for safety, Q 15 min checks in effect.

## 2017-01-10 NOTE — Progress Notes (Signed)
01/10/17 1400:  LRT went to pt room to offer activities, pt wanted some sudoku puzzles.  LRT copied puzzles for pt.   Caroll RancherMarjette Kanyia Heaslip, LRT/CTRS

## 2017-01-10 NOTE — ED Notes (Signed)
Hourly rounding reveals patient sleeping in room. No complaints, stable, in no acute distress. Q15 minute rounds and monitoring via Security Cameras to continue. 

## 2017-01-10 NOTE — ED Notes (Signed)
Hourly rounding reveals patient in room. No complaints, stable, in no acute distress. Q15 minute rounds and monitoring via [employer] to continue. Pt. States he is exercising but can't do his "full workout".

## 2017-01-10 NOTE — ED Notes (Signed)
Pt was reluctant to take medication but agreed to take one to start with.  He agreed to take his Depakote.

## 2017-01-10 NOTE — ED Notes (Signed)
Hourly rounding reveals patient in room. No complaints, stable, in no acute distress. Q15 minute rounds and monitoring via Security Cameras to continue. 

## 2017-01-10 NOTE — ED Notes (Signed)
Hourly rounding reveals patient in room exercising . No complaints, stable, in no acute distress. Q15 minute rounds and monitoring via [employer] to continue.

## 2017-01-10 NOTE — [facility] Assessment (Signed)
[facility] Assessment Progress Note  Per Thedore MinsMojeed Akintayo, MD, this pt requires psychiatric hospitalization at this time.  The following facilities have been contacted to seek placement for this pt, with results as noted:  Beds available, information sent, decision pending:  [facility]   [facility] capacity:  [facility]   Doylene Canninghomas Tiwanna Tuch, [place] Triage Specialist [phone]

## 2017-01-11 NOTE — ED Notes (Signed)
Pt is irritable this morning.  He stated "You all are not respecting my rights by giving me medications, they hurt my kidneys yesterday and are making me drowsy"  He also stated " I do not want a male doctor,  All males are idiots."  He stated this over and over and appeared very agitated.

## 2017-01-11 NOTE — ED Notes (Signed)
Pt A&O x 3, no distress noted, calm & cooperative.  Monitoring for safety, Q 15 min checks in effect. 

## 2017-01-11 NOTE — Progress Notes (Signed)
01/11/17 1350:  LRT went to pt room to introduce self and offer activities, pt declined activities.  Caroll RancherMarjette Shelsie Tijerino, LRT/CTRS

## 2017-01-12 NOTE — ED Notes (Signed)
Patient refused
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
## 2017-01-12 NOTE — ED Notes (Signed)
On the phone 

## 2017-01-12 NOTE — [facility] Suicide Risk Assessment (Signed)
Suicide Risk Assessment  Discharge Assessment   [facility]   Principal Problem: Bipolar affective disorder, manic, severe, with psychotic behavior [facility](HCC) Discharge Diagnoses:  Patient Active Problem List   Diagnosis Date Noted  . Bipolar affective disorder, manic, severe, with psychotic behavior (HCC) [F31.2] 01/10/2017    Priority: High  . Cannabis abuse [F12.10] 07/06/2013    Priority: High  . Encounter for preventive health examination [Z00.00] 07/26/2013  . Potential exposure to STD [Z20.2] 07/26/2013  . Bipolar 1 disorder (HCC) [F31.9] 07/03/2013    Total Time spent with patient: 30 minutes  Musculoskeletal: Strength & Muscle Tone: within normal limits Gait & Station: normal Patient leans: N/A  Psychiatric Specialty Exam:   Blood pressure 127/97, pulse 108, temperature 99 F (37.2 C), temperature source Oral, resp. rate 16, SpO2 98 %.There is no height or weight on file to calculate BMI.  General Appearance: Casual  Eye Contact::  Good  Speech:  Normal Rate409  Volume:  Normal  Mood:  Anxious, mild  Affect:  Congruent  Thought Process:  Coherent and Descriptions of Associations: Intact  Orientation:  Full (Time, Place, and Person)  Thought Content:  WDL and Logical  Suicidal Thoughts:  No  Homicidal Thoughts:  No  Memory:  Immediate;   Good Recent;   Good Remote;   Good  Judgement:  Fair  Insight:  Fair  Psychomotor Activity:  Normal  Concentration:  Good  Recall:  Good  Fund of Knowledge:Fair  Language: Good  Akathisia:  No  Handed:  Right  AIMS (if indicated):     Assets:  Leisure Time Physical Health Resilience Social Support  Sleep:     Cognition: WNL  ADL's:  Intact   Mental Status Per Nursing Assessment::   On Admission:   mania  Demographic Factors:  Male and Adolescent or young adult  Loss Factors: Legal issues  Historical Factors: NA  Risk Reduction Factors:   Sense of responsibility to family and  Positive social support  Continued Clinical Symptoms:  Anxiety, mild  Cognitive Features That Contribute To Risk:  None    Suicide Risk:  Minimal: No identifiable suicidal ideation.  Patients presenting with no risk factors but with morbid ruminations; may be classified as minimal risk based on the severity of the depressive symptoms    Plan Of Care/Follow-up recommendations:  Activity:  as tolerated Diet:  heart healhty diet  LORD, JAMISON, NP 01/12/2017, 8:59 AM

## 2017-01-12 NOTE — ED Notes (Signed)
IVC has been rescinded per MD, pt to be dc'd security notified

## 2017-01-12 NOTE — ED Notes (Addendum)
Written dc instructions reviewed w/ pt.  Pt encouraged to schedule follow up w/ OP psych provider and follow up with his medical dr concerning his blood pressure.  Pt verbalized understanding.  Pt ambulatory w/o difficulty w/ [employer], belongings returned after leaving the area.

## 2017-01-12 NOTE — BH Assessment (Signed)
[facility] Assessment Progress Note  Per Thedore MinsMojeed Akintayo, MD, this pt does not require psychiatric hospitalization at this time.  Pt presents under IVC initiated by EDP Gerhard Munchobert Lockwood, MD, which Dr Jannifer FranklinAkintayo has rescinded.  [facility]  A court order is found on pt's chart for [place] [facility] to be notified when pt is discharged.  Discharge instructions include referral information for [facility], [facility], and [facility].  Pt's nurse, Wille CelesteJanie, has been notified, and has been informed that [place] [facility] [place] be notified that pt is being discharged.  Doylene Canninghomas Shandi Godfrey, MA Triage Specialist [phone]

## 2017-01-12 NOTE — Discharge Instructions (Signed)
For your ongoing behavioral health needs, you are advised to follow up with outpatient psychiatry.  Contact one of the providers at your earliest opportunity to ask about scheduling an intake appointment:       [facility]      [address]      [place], [place] [address]      [phone]       [facility]      [address]      [place], [place] [address]      [phone]       [facility]      [address], [address]      [place], [place] [address]      [phone]

## 2019-02-24 ENCOUNTER — Encounter (HOSPITAL_COMMUNITY): Payer: Self-pay

## 2019-02-24 ENCOUNTER — Emergency Department (HOSPITAL_COMMUNITY)
Admission: EM | Admit: 2019-02-24 | Discharge: 2019-02-25 | Disposition: A | Discharge status: Home / Self Care | Payer: Federal, State, Local not specified - Other | Attending: Emergency Medicine | Admitting: Emergency Medicine

## 2019-02-24 ENCOUNTER — Other Ambulatory Visit: Payer: Self-pay

## 2019-02-24 DIAGNOSIS — Z79899 Other long term (current) drug therapy: Secondary | ICD-10-CM | POA: Insufficient documentation

## 2019-02-24 DIAGNOSIS — F312 Bipolar disorder, current episode manic severe with psychotic features: Secondary | ICD-10-CM | POA: Insufficient documentation

## 2019-02-24 DIAGNOSIS — F17210 Nicotine dependence, cigarettes, uncomplicated: Secondary | ICD-10-CM | POA: Insufficient documentation

## 2019-02-24 DIAGNOSIS — F250 Schizoaffective disorder, bipolar type: Secondary | ICD-10-CM | POA: Insufficient documentation

## 2019-02-24 LAB — CBC WITH DIFFERENTIAL/PLATELET
ABS IMMATURE GRANULOCYTES: 0.01 10*3/uL (ref 0.00–0.07)
BASOS PCT: 1 %
Basophils Absolute: 0 10*3/uL (ref 0.0–0.1)
Eosinophils Absolute: 0 10*3/uL (ref 0.0–0.5)
Eosinophils Relative: 1 %
HCT: 44.3 % (ref 39.0–52.0)
Hemoglobin: 15.3 g/dL (ref 13.0–17.0)
IMMATURE GRANULOCYTES: 0 %
Lymphocytes Relative: 21 %
Lymphs Abs: 1.4 10*3/uL (ref 0.7–4.0)
MCH: 30.4 pg (ref 26.0–34.0)
MCHC: 34.5 g/dL (ref 30.0–36.0)
MCV: 88.1 fL (ref 80.0–100.0)
MONO ABS: 0.3 10*3/uL (ref 0.1–1.0)
MONOS PCT: 5 %
NEUTROS ABS: 4.9 10*3/uL (ref 1.7–7.7)
NEUTROS PCT: 72 %
PLATELETS: 255 10*3/uL (ref 150–400)
RBC: 5.03 MIL/uL (ref 4.22–5.81)
RDW: 12.7 % (ref 11.5–15.5)
WBC: 6.7 10*3/uL (ref 4.0–10.5)
nRBC: 0 % (ref 0.0–0.2)

## 2019-02-24 LAB — COMPREHENSIVE METABOLIC PANEL
ALT: 25 U/L (ref 0–44)
AST: 30 U/L (ref 15–41)
Albumin: 5.2 g/dL — ABNORMAL HIGH (ref 3.5–5.0)
Alkaline Phosphatase: 83 U/L (ref 38–126)
Anion gap: 9 (ref 5–15)
BUN: 17 mg/dL (ref 6–20)
CO2: 25 mmol/L (ref 22–32)
Calcium: 9.7 mg/dL (ref 8.9–10.3)
Chloride: 103 mmol/L (ref 98–111)
Creatinine, Ser: 0.93 mg/dL (ref 0.61–1.24)
GFR calc Af Amer: >60 mL/min (ref >60)
GFR calc non Af Amer: >60 mL/min (ref >60)
GLUCOSE: 92 mg/dL (ref 70–99)
POTASSIUM: 3.3 mmol/L — AB (ref 3.5–5.1)
SODIUM: 137 mmol/L (ref 135–145)
TOTAL PROTEIN: 8.2 g/dL — AB (ref 6.5–8.1)
Total Bilirubin: 0.9 mg/dL (ref 0.3–1.2)

## 2019-02-24 LAB — CK: CK TOTAL: 589 U/L — AB (ref 49–397)

## 2019-02-24 LAB — RAPID URINE DRUG SCREEN, HOSP PERFORMED
Amphetamines: NOT DETECTED
BARBITURATES: NOT DETECTED
Benzodiazepines: NOT DETECTED
Cocaine: NOT DETECTED
Opiates: NOT DETECTED
Tetrahydrocannabinol: POSITIVE — AB

## 2019-02-24 LAB — VALPROIC ACID LEVEL: Valproic Acid Lvl: <10 ug/mL — ABNORMAL LOW (ref 50.0–100.0)

## 2019-02-24 LAB — ETHANOL: Alcohol, Ethyl (B): <10 mg/dL (ref <10)

## 2019-02-24 MED ORDER — CLONIDINE HCL 0.1 MG PO TABS
0.1000 mg | ORAL_TABLET | Freq: Every day | ORAL | Status: DC
  Administered 2019-02-24 – 2019-02-25 (×2): 0.1 mg via ORAL
  Filled 2019-02-24 (×2): qty 1

## 2019-02-24 MED ORDER — OLANZAPINE 10 MG PO TBDP
10.0000 mg | ORAL_TABLET | Freq: Every day | ORAL | Status: DC
  Administered 2019-02-24: 10 mg via ORAL
  Filled 2019-02-24: qty 1

## 2019-02-24 MED ORDER — ZIPRASIDONE MESYLATE 20 MG IM SOLR: 10.0000 mg | Freq: Once | INTRAMUSCULAR | Status: DC

## 2019-02-24 MED ORDER — LORAZEPAM 2 MG/ML IJ SOLN: 2.0000 mg | Freq: Once | INTRAMUSCULAR | Status: DC

## 2019-02-24 MED ORDER — HYDROXYZINE HCL 25 MG PO TABS
25.0000 mg | ORAL_TABLET | Freq: Three times a day (TID) | ORAL | Status: DC
  Administered 2019-02-24 (×2): 25 mg via ORAL
  Filled 2019-02-24 (×3): qty 1

## 2019-02-24 MED ORDER — ZIPRASIDONE MESYLATE 20 MG IM SOLR
10.0000 mg | Freq: Once | INTRAMUSCULAR | Status: AC
  Administered 2019-02-24: 10 mg via INTRAMUSCULAR
  Filled 2019-02-24: qty 20

## 2019-02-24 MED ORDER — DIVALPROEX SODIUM 500 MG PO DR TAB
500.0000 mg | DELAYED_RELEASE_TABLET | Freq: Two times a day (BID) | ORAL | Status: DC
  Administered 2019-02-24: 500 mg via ORAL
  Filled 2019-02-24 (×2): qty 1

## 2019-02-24 MED ORDER — HYDROXYZINE HCL 25 MG PO TABS
50.0000 mg | ORAL_TABLET | Freq: Three times a day (TID) | ORAL | Status: DC | PRN
  Administered 2019-02-24: 50 mg via ORAL
  Filled 2019-02-24: qty 2

## 2019-02-24 MED ORDER — LORAZEPAM 2 MG/ML IJ SOLN
2.0000 mg | Freq: Once | INTRAMUSCULAR | Status: AC
  Administered 2019-02-24: 2 mg via INTRAMUSCULAR
  Filled 2019-02-24: qty 1

## 2019-02-24 MED ORDER — DIPHENHYDRAMINE HCL 50 MG/ML IJ SOLN
50.0000 mg | Freq: Once | INTRAMUSCULAR | Status: AC
  Administered 2019-02-24: 50 mg via INTRAMUSCULAR
  Filled 2019-02-24: qty 1

## 2019-02-24 MED ORDER — LORAZEPAM 1 MG PO TABS
2.0000 mg | ORAL_TABLET | Freq: Once | ORAL | Status: AC
  Administered 2019-02-24: 2 mg via ORAL
  Filled 2019-02-24: qty 2

## 2019-02-24 NOTE — Progress Notes (Signed)
Patient ripped the automatic badge reader off the way by the nursing station and is visibly agitated and disorganized in his thought process. Call placed to on Call provider to review patient presentation, provider placed orders (see MAR). Pt. Given IM medications with patient compliance and no complications. Will continue to monitor for safety and comfort of the patient.

## 2019-02-24 NOTE — ED Notes (Signed)
Pt verbally aggressive and combative to security and [employer]. Multiple attempts to redirect without success. Pt continues to come out of room. Pt into another pt's room. Pt attempts to leave TCU (runs). Pt is detained by security and [employer]. EDP Freida Busman made aware. Orders given. IVC papers in process
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
## 2019-02-24 NOTE — ED Triage Notes (Signed)
Upon arrival to TCU, pt unable to sit still. Pt closing curtains and door in room 26. Upon entering room, floor wet. Pt states uncertain how water worked. Pt states he was thirsty. Water coming from toilet area. Moved to room 27. Pt unable to stay in room. Security and [employer] present for assistance. EDP Freida Busman called to evaluate.

## 2019-02-24 NOTE — ED Notes (Signed)
PT CALM AND COOPERATIVE WITH CARE AT PRESENT. [employer] AND SECURITY AT BEDSIDE FOR SUPPORT

## 2019-02-24 NOTE — BH Assessment (Signed)
[facility] Assessment Progress Note  Case was staffed with Parks FNP who recommended a inpatient admission to assist with stabilization.     

## 2019-02-24 NOTE — Progress Notes (Signed)
Patient up at the nursing station talking to RN's, visibly agitation and disorganized in his thought process. Pt. Is tangential and pressured in his speech. Will give support and encouragement and attempt to calm the patient down.

## 2019-02-24 NOTE — ED Notes (Signed)
Bed: [id number] Expected date:  Expected time:  Means of arrival:  Comments: 
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
## 2019-02-24 NOTE — Progress Notes (Signed)
Patient visibly anxious, pacing at this time. Will give PRN medication for safety and comfort.

## 2019-02-24 NOTE — Progress Notes (Signed)
Patient upon arrival to the unit is observed pacing around the nursing station. Pt. Is disorganized in his thought process and tangential. Pt. Is visibly anxious. Pt. Is not aggressive at this time. Pt. Denies si/hi/avh, able to contract for safety with this RN. Will continue to monitor for safety and comfort of the patient. Pt. Made aware of camera surveillance and 15 minute checks for his safety.

## 2019-02-24 NOTE — ED Notes (Signed)
TRANSFERRED TO [facility] WITH ASSISTANCE OF [employer] AND SECURITY WITHOUT EVENT.

## 2019-02-24 NOTE — ED Triage Notes (Signed)
Pt wanded and walked back to TCU room 26. Pt attempted to take his clothes off in triage and throw them all in the trash can. Explained to pt that he needs to remain dressed until we get back to a room. Security with this RN while talking to patient. Pt not easily redirectable, MD Freida Busman made aware.

## 2019-02-24 NOTE — ED Provider Notes (Signed)
[facility]-EMERGENCY DEPT Provider Note   CSN: [id number] Arrival date & time: 02/24/19  2637    History   Chief Complaint Chief Complaint  Patient presents with  . Medical Clearance    HPI John [name] is a 33 y.o. male.     33 year old male with history of bipolar disorder as well as schizoaffective disorder presents with bizarre behavior as well as responding to internal stimuli.  Patient arrived on his own recognizance.  States even walking for some time.  Patient is not been cooperative.  Patient has been noncompliant with his medications.  History is difficult to obtain due to his current symptoms.     Past Medical History:  Diagnosis Date  . Mental disorder     Patient Active Problem List   Diagnosis Date Noted  . Bipolar affective disorder, manic, severe, with psychotic behavior (HCC) 01/10/2017  . Encounter for preventive health examination 07/26/2013  . Potential exposure to STD 07/26/2013  . Cannabis abuse 07/06/2013  . Bipolar 1 disorder (HCC) 07/03/2013    No past surgical history on file.      Home Medications    Prior to Admission medications   Medication Sig Start Date End Date Taking? Authorizing Provider  divalproex (DEPAKOTE) 500 MG DR tablet Take 1 tablet (500 mg total) by mouth every 12 (twelve) hours. 07/09/13   Charm Rings, NP  doxycycline (VIBRAMYCIN) 100 MG capsule Take 1 capsule (100 mg total) by mouth 2 (two) times daily. Patient not taking: Reported on 01/09/2017 07/12/16   Linna Hoff, MD  fluticasone (CUTIVATE) 0.05 % cream Apply topically 2 (two) times daily. Patient not taking: Reported on 01/09/2017 07/12/16   Linna Hoff, MD  hydrOXYzine (ATARAX/VISTARIL) 50 MG tablet Take 50 mg by mouth 2 (two) times daily as needed. 07/09/13   Charm Rings, NP  Multiple Vitamin (MULTIVITAMIN WITH MINERALS) TABS Take 1 tablet by mouth daily. Patient not taking: Reported on 01/09/2017 07/09/13   Charm Rings, NP   Nutritional Supplements (COMPLETE NUTRITION) LIQD Take by mouth daily.    [provider]    Family History No family history on file.  Social History Social History   Tobacco Use  . Smoking status: Current Every Day Smoker    Types: Cigarettes  Substance Use Topics  . Alcohol use: No  . Drug use: Yes    Types: Marijuana     Allergies   Patient has no known allergies.   Review of Systems Review of Systems  Unable to perform ROS: Psychiatric disorder     Physical Exam Updated Vital Signs There were no vitals taken for this visit.  Physical Exam Vitals signs and nursing note reviewed.  Constitutional:      General: He is not in acute distress.    Appearance: Normal appearance. He is well-developed. He is not toxic-appearing.  HENT:     Head: Normocephalic and atraumatic.  Eyes:     General: Lids are normal.     Conjunctiva/sclera: Conjunctivae normal.     Pupils: Pupils are equal, round, and reactive to light.  Neck:     Musculoskeletal: Normal range of motion and neck supple.     Thyroid: No thyroid mass.     Trachea: No tracheal deviation.  Cardiovascular:     Rate and Rhythm: Normal rate and regular rhythm.     Heart sounds: Normal heart sounds. No murmur. No gallop.   Pulmonary:     Effort: Pulmonary effort  is normal. No respiratory distress.     Breath sounds: Normal breath sounds. No stridor. No decreased breath sounds, wheezing, rhonchi or rales.  Abdominal:     General: Bowel sounds are normal. There is no distension.     Palpations: Abdomen is soft.     Tenderness: There is no abdominal tenderness. There is no rebound.  Musculoskeletal: Normal range of motion.        General: No tenderness.  Skin:    General: Skin is warm and dry.     Findings: No abrasion or rash.  Neurological:     Mental Status: He is alert and oriented to person, place, and time.     GCS: GCS eye subscore is 4. GCS verbal subscore is 5. GCS motor subscore is 6.      Cranial Nerves: No cranial nerve deficit.     Sensory: No sensory deficit.  Psychiatric:        Attention and Perception: He is inattentive.        Mood and Affect: Affect is labile and inappropriate.        Speech: Speech is rapid and pressured.        Behavior: Behavior is hyperactive.        Thought Content: Thought content does not include suicidal ideation. Thought content does not include suicidal plan.      ED Treatments / Results  Labs (all labs ordered are listed, but only abnormal results are displayed) Labs Reviewed  CBC WITH DIFFERENTIAL/PLATELET  COMPREHENSIVE METABOLIC PANEL  ETHANOL  RAPID URINE DRUG SCREEN, HOSP PERFORMED  VALPROIC ACID LEVEL    EKG None  Radiology No results found.  Procedures Procedures (including critical care time)  Medications Ordered in ED Medications  LORazepam (ATIVAN) tablet 2 mg (has no administration in time range)  OLANZapine zydis (ZYPREXA) disintegrating tablet 10 mg (has no administration in time range)     Initial Impression / Assessment and Plan / ED Course  I have reviewed the triage vital signs and the nursing notes.  Pertinent labs & imaging results that were available during my care of the patient were reviewed by me and considered in my medical decision making (see chart for details).        Patient given oral Ativan as well as will be given Zyprexa if needed.  To patient's labile behavior as well as history of psychiatric condition he was placed under IVC.  Has been seen by behavioral health and will be medically clear for disposition  Final Clinical Impressions(s) / ED Diagnoses   Final diagnoses:  None    ED Discharge Orders    None       Lorre Nick, MD 02/24/19 [phone]

## 2019-02-25 DIAGNOSIS — F312 Bipolar disorder, current episode manic severe with psychotic features: Secondary | ICD-10-CM

## 2019-02-25 DIAGNOSIS — F17210 Nicotine dependence, cigarettes, uncomplicated: Secondary | ICD-10-CM

## 2019-02-25 NOTE — Progress Notes (Signed)
IVC rescinded at 11:48AM  Stacy Gardner, [facility] Care Coordination Department System Wide Float  [phone]

## 2019-02-25 NOTE — Progress Notes (Signed)
Patient up at the nursing station disorganized in his thought process and pressured in his speech. Pt. Is mostly redirectable, but mildly agitated. Pt. Also pacing up and down the hallway, not wanting to sleep. Will continue to monitor for safety.

## 2019-02-25 NOTE — Consult Note (Addendum)
[facility] Psych ED Discharge  02/25/2019 11:43 AM John [name]  MRN:  [id number] Principal Problem: Bipolar affective disorder, manic, severe, with psychotic behavior [facility]) Discharge Diagnoses: Principal Problem:   Bipolar affective disorder, manic, severe, with psychotic behavior (HCC)   Subjective: Pt was seen and chart reviewed with treatment team and Dr Jannifer Franklin. Pt denies suicidal/homicidal ideation, denies auditory/visual hallucinations and does not appear to be responding to internal stimuli. 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 He stated he smokes weed daily. His UDS positive for THC and BAL negative. Pt has been calm and cooperative while in the emergency room. He is pleasant on approach, he is neat and clean and his room is neat. He does pace in the hallway but he also stated he is very active and works out a lot. Pt is psychiatrically clear.   Total Time spent with patient: 30 minutes  Past Psychiatric History: As above  Past Medical History:  Past Medical History:  Diagnosis Date  . Mental disorder    History reviewed. No pertinent surgical history. Family History: No family history on file. Family Psychiatric  History: Pt did not give this information Social History:  Social History   Substance and Sexual Activity  Alcohol Use No     Social History   Substance and Sexual Activity  Drug Use Yes  . Types: Marijuana    Social History   Socioeconomic History  . Marital status: Single    Spouse name: Not on file  . Number of children: Not on file  . Years of education: Not on file  . Highest education level: Not on file  Occupational History  . Not on file  Social Needs  . Financial resource strain: Not on file  . Food insecurity:    Worry: Not on file    Inability: Not on file  . Transportation needs:    Medical: Not on file    Non-medical: Not on file  Tobacco Use  . Smoking status: Current Every Day Smoker    Types: Cigarettes   Substance and Sexual Activity  . Alcohol use: No  . Drug use: Yes    Types: Marijuana  . Sexual activity: Not on file  Lifestyle  . Physical activity:    Days per week: Not on file    Minutes per session: Not on file  . Stress: Not on file  Relationships  . Social connections:    Talks on phone: Not on file    Gets together: Not on file    Attends religious service: Not on file    Active member of club or organization: Not on file    Attends meetings of clubs or organizations: Not on file    Relationship status: Not on file  Other Topics Concern  . Not on file  Social History Narrative  . Not on file    Has this patient used any form of tobacco in the last 30 days? (Cigarettes, Smokeless Tobacco, Cigars, and/or Pipes) Prescription not provided because: Pt declined  Current Medications: Current Facility-Administered Medications  Medication Dose Route Frequency Provider Last Rate Last Dose  . cloNIDine (CATAPRES) tablet 0.1 mg  0.1 mg Oral Daily Laveda Abbe, NP   0.1 mg at 02/25/19 2035  . divalproex (DEPAKOTE) DR tablet 500 mg  500 mg Oral Q12H Laveda Abbe, NP   500 mg at 02/24/19 2104  . hydrOXYzine (ATARAX/VISTARIL) tablet 25 mg  25 mg Oral TID Arville Care,  Dorise Hiss, NP   25 mg at 02/24/19 2105  . hydrOXYzine (ATARAX/VISTARIL) tablet 50 mg  50 mg Oral TID PRN Lorre Nick, MD   50 mg at 02/24/19 1931  . OLANZapine zydis (ZYPREXA) disintegrating tablet 10 mg  10 mg Oral QHS Lorre Nick, MD   10 mg at 02/24/19 2105   Current Outpatient Medications  Medication Sig Dispense Refill  . fluticasone (CUTIVATE) 0.05 % cream Apply topically 2 (two) times daily. 30 g 0  . Multiple Vitamin (MULTIVITAMIN WITH MINERALS) TABS Take 1 tablet by mouth daily. 30 tablet 0  . Nutritional Supplements (COMPLETE NUTRITION) LIQD Take by mouth daily.    . Omega-3 Fatty Acids (FISH OIL) 500 MG CAPS Take 1,000 mg by mouth daily.    . divalproex (DEPAKOTE) 500 MG DR tablet  Take 1 tablet (500 mg total) by mouth every 12 (twelve) hours. (Patient not taking: Reported on 02/24/2019) 60 tablet 0  . doxycycline (VIBRAMYCIN) 100 MG capsule Take 1 capsule (100 mg total) by mouth 2 (two) times daily. (Patient not taking: Reported on 01/09/2017) 20 capsule 0     Musculoskeletal: Strength & Muscle Tone: within normal limits Gait & Station: normal Patient leans: N/A  Psychiatric Specialty Exam: Physical Exam  Constitutional: He is oriented to person, place, and time. He appears well-developed and well-nourished.  HENT:  Head: Normocephalic.  Respiratory: Effort normal.  Musculoskeletal: Normal range of motion.  Neurological: He is alert and oriented to person, place, and time.  Psychiatric: He has a normal mood and affect. His speech is normal and behavior is normal. Judgment and thought content normal. Cognition and memory are normal.    Review of Systems  Psychiatric/Behavioral: Positive for substance abuse.  All other systems reviewed and are negative.   Blood pressure 140/81, pulse (!) 55, temperature 98 F (36.7 C), temperature source Oral, resp. rate 16, SpO2 97 %.There is no height or weight on file to calculate BMI.  General Appearance: Casual  Eye Contact:  Good  Speech:  Clear and Coherent and Normal Rate  Volume:  Normal  Mood:  Euthymic  Affect:  Congruent  Thought Process:  Coherent, Goal Directed, Linear and Descriptions of Associations: Intact  Orientation:  Full (Time, Place, and Person)  Thought Content:  Logical  Suicidal Thoughts:  No  Homicidal Thoughts:  No  Memory:  Immediate;   Good Recent;   Fair Remote;   Fair  Judgement:  Fair  Insight:  Fair  Psychomotor Activity:  Normal  Concentration:  Concentration: Good and Attention Span: Good  Recall:  Good  Fund of Knowledge:  Good  Language:  Good  Akathisia:  No  Handed:  Right  AIMS (if indicated):     Assets:  Architect Housing Social  Support Vocational/Educational  ADL's:  Intact  Cognition:  WNL  Sleep:        Demographic Factors:  Male and Adolescent or young adult  Loss Factors: NA  Historical Factors: Family history of mental illness or substance abuse  Risk Reduction Factors:   Sense of responsibility to family  Continued Clinical Symptoms:  Alcohol/Substance Abuse/Dependencies  Cognitive Features That Contribute To Risk:  Closed-mindedness    Suicide Risk:  Minimal: No identifiable suicidal ideation.  Patients presenting with no risk factors but with morbid ruminations; may be classified as minimal risk based on the severity of the depressive symptoms    Plan Of Care/Follow-up recommendations:  Activity:  as tolerated Diet:  Heart healthy  Disposition and Treatment Plan :Bipolar affective disorder, manic, severe, with psychotic behavior (HCC)  Take all medications as prescribed by your outpatient provider. Keep all follow-up appointments as scheduled.  Do not consume alcohol or use illegal drugs while on prescription medications. Report any adverse effects from your medications to your primary care provider promptly.  In the event of recurrent symptoms or worsening symptoms, call 911, a crisis hotline, or go to the nearest emergency department for evaluation.   Laveda Abbe, NP 02/25/2019, 11:43 AM  Patient seen face-to-face for psychiatric evaluation, chart reviewed and case discussed with the physician extender and developed treatment plan. Reviewed the information documented and agree with the treatment plan. Thedore Mins, MD

## 2019-02-28 ENCOUNTER — Other Ambulatory Visit: Payer: Self-pay

## 2019-02-28 ENCOUNTER — Encounter (HOSPITAL_COMMUNITY): Payer: Self-pay

## 2019-02-28 ENCOUNTER — Emergency Department (HOSPITAL_COMMUNITY)
Admission: EM | Admit: 2019-02-28 | Discharge: 2019-03-01 | Disposition: A | Discharge status: Other Acute Inpatient Hospital | Payer: Self-pay | Attending: Emergency Medicine | Admitting: Emergency Medicine

## 2019-02-28 DIAGNOSIS — F309 Manic episode, unspecified: Secondary | ICD-10-CM

## 2019-02-28 DIAGNOSIS — Z79899 Other long term (current) drug therapy: Secondary | ICD-10-CM | POA: Insufficient documentation

## 2019-02-28 DIAGNOSIS — F312 Bipolar disorder, current episode manic severe with psychotic features: Secondary | ICD-10-CM | POA: Insufficient documentation

## 2019-02-28 HISTORY — DX: Bipolar disorder, unspecified: F31.9

## 2019-02-28 LAB — COMPREHENSIVE METABOLIC PANEL
ALT: 34 U/L (ref 0–44)
AST: 52 U/L — AB (ref 15–41)
Albumin: 5.1 g/dL — ABNORMAL HIGH (ref 3.5–5.0)
Alkaline Phosphatase: 64 U/L (ref 38–126)
Anion gap: 12 (ref 5–15)
BUN: 18 mg/dL (ref 6–20)
CO2: 27 mmol/L (ref 22–32)
CREATININE: 1.31 mg/dL — AB (ref 0.61–1.24)
Calcium: 9.8 mg/dL (ref 8.9–10.3)
Chloride: 100 mmol/L (ref 98–111)
GFR calc Af Amer: >60 mL/min (ref >60)
GFR calc non Af Amer: >60 mL/min (ref >60)
Glucose, Bld: 85 mg/dL (ref 70–99)
Potassium: 4.3 mmol/L (ref 3.5–5.1)
Sodium: 139 mmol/L (ref 135–145)
Total Bilirubin: 0.9 mg/dL (ref 0.3–1.2)
Total Protein: 8.6 g/dL — ABNORMAL HIGH (ref 6.5–8.1)

## 2019-02-28 LAB — CBC
HCT: 46.7 % (ref 39.0–52.0)
HEMOGLOBIN: 15.5 g/dL (ref 13.0–17.0)
MCH: 30.5 pg (ref 26.0–34.0)
MCHC: 33.2 g/dL (ref 30.0–36.0)
MCV: 91.7 fL (ref 80.0–100.0)
Platelets: 263 10*3/uL (ref 150–400)
RBC: 5.09 MIL/uL (ref 4.22–5.81)
RDW: 13 % (ref 11.5–15.5)
WBC: 8.7 10*3/uL (ref 4.0–10.5)
nRBC: 0 % (ref 0.0–0.2)

## 2019-02-28 LAB — SALICYLATE LEVEL: Salicylate Lvl: <7 mg/dL (ref 2.8–30.0)

## 2019-02-28 LAB — ACETAMINOPHEN LEVEL: Acetaminophen (Tylenol), Serum: <10 ug/mL — ABNORMAL LOW (ref 10–30)

## 2019-02-28 LAB — ETHANOL: Alcohol, Ethyl (B): <10 mg/dL (ref <10)

## 2019-02-28 MED ORDER — LORAZEPAM 2 MG/ML IJ SOLN: 0.0000 mg | Freq: Two times a day (BID) | INTRAMUSCULAR | Status: DC

## 2019-02-28 MED ORDER — VITAMIN B-1 100 MG PO TABS: 100.0000 mg | ORAL_TABLET | Freq: Every day | ORAL | Status: DC

## 2019-02-28 MED ORDER — OLANZAPINE 10 MG IM SOLR
10.0000 mg | Freq: Once | INTRAMUSCULAR | Status: AC
  Administered 2019-03-01: 10 mg via INTRAMUSCULAR
  Filled 2019-02-28: qty 10

## 2019-02-28 MED ORDER — LORAZEPAM 1 MG PO TABS: 0.0000 mg | ORAL_TABLET | Freq: Two times a day (BID) | ORAL | Status: DC

## 2019-02-28 MED ORDER — THIAMINE HCL 100 MG/ML IJ SOLN: 100.0000 mg | Freq: Every day | INTRAMUSCULAR | Status: DC

## 2019-02-28 MED ORDER — LORAZEPAM 1 MG PO TABS
0.0000 mg | ORAL_TABLET | Freq: Four times a day (QID) | ORAL | Status: DC
  Administered 2019-03-01: 2 mg via ORAL
  Filled 2019-02-28: qty 2

## 2019-02-28 MED ORDER — ONDANSETRON HCL 4 MG PO TABS: 4.0000 mg | ORAL_TABLET | Freq: Three times a day (TID) | ORAL | Status: DC | PRN

## 2019-02-28 MED ORDER — ACETAMINOPHEN 325 MG PO TABS: 650.0000 mg | ORAL_TABLET | ORAL | Status: DC | PRN

## 2019-02-28 MED ORDER — LORAZEPAM 2 MG/ML IJ SOLN
0.0000 mg | Freq: Four times a day (QID) | INTRAMUSCULAR | Status: DC
  Administered 2019-02-28: 4 mg via INTRAVENOUS
  Filled 2019-02-28: qty 2
  Filled 2019-02-28: qty 1

## 2019-02-28 NOTE — BH Assessment (Signed)
[facility] Assessment Progress Note   Pt given IM Ativan.  TTS to collect collateral information from petitioner.

## 2019-02-28 NOTE — ED Notes (Addendum)
Pts roommate Marcha Solders sts he went to magistrate to IVC pt. Per Mr Eliberto Ivory , he brough pt here Saturday for severe mania and pt stayed 1 day and was discharged after getting meds.  Pt was lucid for 1 day and then became incoherent on Monday and has been since.  He sts pt started this cycle about a week ago, progressively getting worse.  He requests pt be evealuated and hoping he goes inpatient to start back on bipolar meds.  [facility] and is capable of aggression per roommate.

## 2019-02-28 NOTE — ED Triage Notes (Signed)
Patient is not talking in triage. Patient is accompanied by a friend. Patient's friend states that the patient is in a manic episode.   Patient's friend reports that the patient was breaking objects in his home, took his keys from the friend and drove to [facility]. Patient's friend states that the patient was drinking during this drive.  Patient shook his head indicating no when asked about SI/HI, visual or auditory hallucinations  Patient hypertensive in triage and friend states that he is noncompliant  With his BP meds. 173/111.

## 2019-02-28 NOTE — BH Assessment (Signed)
Assessment Note  John [name] is an 33 y.o. male.  -Clinician reviewed note by Dr. Adela Lank.  Pt is a 33 yo M with a significant past medical history of bipolar disorder comes in with a chief complaint of mania.  This was noted by his friends, felt he was acting bizarrely and so brought him into the ED.  Patient states that he has no suicidal homicidal ideation.  Admits that he has not been taking his medication for some time, feels that it is more mind over matter.  States that he is able to calm his thinking and that he is able to control his actions.  He denies cough congestion fevers chills or myalgias.  Has been drinking alcohol today.  Level 5 status caveat altered mental status.  Patient became agitated once he went back to the psychiatric hold area.  Tore down the curtain and required multiple police officers to stay in his room.  Will give dose of olanzapine IM.  Patient had been given  Ativan at 20:38 and is unable to participate in assessment at this time.  Clinician gathered information from Epic and petitioner.  Pt is on IVC.  Patient had been on IVC on 02/24/19 and had been seen by TTS counselor Umberger at that time.  Clinician called Marcha Solders [phone], who is a roommate of patient.  He said patient has not been sleeping well for the last few days.  Today he drank an unknown amount of ETOH and drove.  Patient has also been tearing up things in the home.  He has been running into traffic.  He said that patient has been agitated for the last 2 weeks or so.  It has escalated over the last few days.    Roommate also said that patient has not voiced SI or HI.  He would talk about having "big plans" and trying to constantly be in motion and cleaning things also.  He reports that patient does not drink often and usually only socially.  Unsure of how much he may have consumed today.  Patient has been to [facility] in the past and had told roommate he felt he may need to go back there.   Patient has been to [facility] in 06/2013 and at [facility] in the past too.  -Patient discussed with Donell Sievert, PA.  He recommended inpatient care for stabilization.  Patient is on IVC.  Clinician talked with Dr. Adela Lank who is willing to complete the 1st opinion.   Diagnosis: F33.2 Bipolar 1 d/o most recent episode manic w/ psychotic features  Past Medical History:  Past Medical History:  Diagnosis Date  . Bipolar 1 disorder (HCC)   . Mental disorder     Past Surgical History:  Procedure Laterality Date  . WISDOM TOOTH EXTRACTION      Family History: History reviewed. No pertinent family history.  Social History:  reports that he has been smoking cigarettes. He has never used smokeless tobacco. He reports current alcohol use. He reports current drug use. Drug: Marijuana.  Additional Social History:  Alcohol / Drug Use Pain Medications: None Prescriptions: Pt unable to answer.  Not taking any at this time. Over the Counter: Unknown History of alcohol / drug use?: Yes Substance #1 Name of Substance 1: ETOH 1 - Age of First Use: Unknown 1 - Amount (size/oz): Varies 1 - Frequency: Per roommate patient drinks "socially." 1 - Duration: off and on 1 - Last Use / Amount: 03/18 Unknown amount.  CIWA: CIWA-Ar  BP: (!) 173/111 Pulse Rate: 84 COWS:    Allergies: No Known Allergies  Home Medications: (Not in a hospital admission)   OB/GYN Status:  No LMP for male patient.  General Assessment Data Assessment unable to be completed: Yes Reason for not completing assessment: Pt given Ativan IM at 20:38 Location of Assessment: [facility] TTS Assessment: In system Is this a Tele or Face-to-Face Assessment?: Face-to-Face Is this an Initial Assessment or a Re-assessment for this encounter?: Initial Assessment Patient Accompanied by:: N/A Language Other than English: No Living Arrangements: Other (Comment)(Lives w/ roommates.) What gender do you identify as?: Male Marital status:  Single Pregnancy Status: No Living Arrangements: Non-relatives/Friends Can pt return to current living arrangement?: Yes Admission Status: Involuntary Petitioner: Other Is patient capable of signing voluntary admission?: No Referral Source: Self/Family/Friend Insurance type: self pay     Crisis Care Plan Living Arrangements: Non-relatives/Friends Name of Psychiatrist: None Name of Therapist: None  Education Status Is patient currently in school?: No Is the patient employed, unemployed or receiving disability?: Unemployed  Risk to self with the past 6 months Suicidal Ideation: No Has patient been a risk to self within the past 6 months prior to admission? : No Suicidal Intent: No Has patient had any suicidal intent within the past 6 months prior to admission? : No Is patient at risk for suicide?: No Suicidal Plan?: No Has patient had any suicidal plan within the past 6 months prior to admission? : No Access to Means: No What has been your use of drugs/alcohol within the last 12 months?: ETOH Previous Attempts/Gestures: Yes How many times?: 1 Other Self Harm Risks: Yes Triggers for Past Attempts: Unknown Intentional Self Injurious Behavior: Cutting Comment - Self Injurious Behavior: Per IVC, pt broke glass and tried to lay down in it. Family Suicide History: No Recent stressful life event(s): Other (Comment), Financial Problems(Been w/o medication.) Persecutory voices/beliefs?: No Depression: No Depression Symptoms: (Unable to assess) Substance abuse history and/or treatment for substance abuse?: No Suicide prevention information given to non-admitted patients: Not applicable  Risk to Others within the past 6 months Homicidal Ideation: No Does patient have any lifetime risk of violence toward others beyond the six months prior to admission? : No Thoughts of Harm to Others: No Current Homicidal Intent: No Current Homicidal Plan: No Access to Homicidal Means:  No Identified Victim: No one History of harm to others?: No Assessment of Violence: None Noted Violent Behavior Description: (Unknown) Does patient have access to weapons?: No Criminal Charges Pending?: No Does patient have a court date: No Is patient on probation?: Unknown  Psychosis Hallucinations: None noted Delusions: Unspecified  Mental Status Report Appearance/Hygiene: Disheveled, In scrubs Eye Contact: Unable to Assess Motor Activity: Freedom of movement Speech: Unable to assess Level of Consciousness: Sleeping, Sedated Mood: Depressed, Anxious Affect: Unable to Assess Anxiety Level: Severe Thought Processes: Unable to Assess Judgement: Impaired Orientation: Unable to assess Obsessive Compulsive Thoughts/Behaviors: Unable to Assess  Cognitive Functioning Concentration: Poor Memory: Unable to Assess Is patient IDD: No Insight: Unable to Assess Impulse Control: Poor Appetite: (Unable to assess) Have you had any weight changes? : (Unknown) Sleep: Decreased Total Hours of Sleep: (<4H/D) Vegetative Symptoms: Unable to Assess  ADLScreening [facility] Assessment Services) Patient's cognitive ability adequate to safely complete daily activities?: Yes Patient able to express need for assistance with ADLs?: Yes Independently performs ADLs?: Yes (appropriate for developmental age)  Prior Inpatient Therapy Prior Inpatient Therapy: Yes Prior Therapy Dates: 2018, 2015, 2014 Prior Therapy Facilty/Provider(s): [facility],  [facility] Reason for Treatment: MH issues  Prior Outpatient Therapy Prior Outpatient Therapy: No(Has gone to [facility] in the past per roommate.) Does patient have an ACCT team?: No Does patient have Intensive In-House Services?  : No Does patient have [facility] services? : No Does patient have P4CC services?: No  ADL Screening (condition at time of admission) Patient's cognitive ability adequate to safely complete daily activities?: Yes Is the patient deaf or have  difficulty hearing?: No Does the patient have difficulty seeing, even when wearing glasses/contacts?: No Does the patient have difficulty concentrating, remembering, or making decisions?: Yes Patient able to express need for assistance with ADLs?: Yes Does the patient have difficulty dressing or bathing?: No Independently performs ADLs?: Yes (appropriate for developmental age) Does the patient have difficulty walking or climbing stairs?: No Weakness of Legs: None Weakness of Arms/Hands: None       Abuse/Neglect Assessment (Assessment to be complete while patient is alone) Physical Abuse: Denies(UTA) Verbal Abuse: Denies(UTA) Sexual Abuse: Denies(UTA) Exploitation of patient/patient's resources: Denies Self-Neglect: Denies     Merchant navy officer (For Healthcare) Does Patient Have a Medical Advance Directive?: No Would patient like information on creating a medical advance directive?: No - Patient declined          Disposition:  Disposition Initial Assessment Completed for this Encounter: Yes Patient referred to: Other (Comment)(IVC papers to be reviewed in AM)  On Site Evaluation by:   Reviewed with Physician:    Alexandria Lodge 02/28/2019 10:17 PM

## 2019-02-28 NOTE — ED Notes (Signed)
Pt's friend/roommate, Marcha Solders, stated that pt is running into traffic and tearing things up in the house. Mr. Eliberto Ivory stated that he feels like Pt should not be D/C, and should be made to stay Mr. Eliberto Ivory decided that he should attempt to file IVC papers on the Pt Mr. Austin given instructions on how to do so and will return when he has completed the paperwork at the Magistrate's office Triage RN informed of same

## 2019-02-28 NOTE — ED Provider Notes (Signed)
[facility]-EMERGENCY DEPT Provider Note   CSN: [id number] Arrival date & time: 02/28/19  1848    History   Chief Complaint Chief Complaint  Patient presents with  . Psychiatric Evaluation    HPI John [name] is a 33 y.o. male.     33 yo M with a significant past medical history of bipolar disorder comes in with a chief complaint of mania.  This was noted by his friends, felt he was acting bizarrely and so brought him into the ED.  Patient states that he has no suicidal homicidal ideation.  Admits that he has not been taking his medication for some time, feels that it is more mind over matter.  States that he is able to calm his thinking and that he is able to control his actions.  He denies cough congestion fevers chills or myalgias.  Has been drinking alcohol today.  Level 5 status caveat altered mental status  The history is provided by the patient.  Illness  Severity:  Mild Onset quality:  Sudden Duration:  1 week Timing:  Constant Progression:  Worsening Chronicity:  New Associated symptoms: no abdominal pain, no chest pain, no congestion, no diarrhea, no fever, no headaches, no myalgias, no rash, no shortness of breath and no vomiting     Past Medical History:  Diagnosis Date  . Bipolar 1 disorder (HCC)   . Mental disorder     Patient Active Problem List   Diagnosis Date Noted  . Bipolar affective disorder, manic, severe, with psychotic behavior (HCC) 01/10/2017  . Encounter for preventive health examination 07/26/2013  . Potential exposure to STD 07/26/2013  . Cannabis abuse 07/06/2013  . Bipolar 1 disorder (HCC) 07/03/2013    Past Surgical History:  Procedure Laterality Date  . WISDOM TOOTH EXTRACTION          Home Medications    Prior to Admission medications   Medication Sig Start Date End Date Taking? Authorizing Provider  divalproex (DEPAKOTE) 500 MG DR tablet Take 1 tablet (500 mg total) by mouth every 12 (twelve) hours.  Patient not taking: Reported on 02/24/2019 07/09/13   Charm Rings, NP  Multiple Vitamin (MULTIVITAMIN WITH MINERALS) TABS Take 1 tablet by mouth daily. 07/09/13   Charm Rings, NP  Omega-3 Fatty Acids (FISH OIL) 500 MG CAPS Take 1,000 mg by mouth daily.    [provider]    Family History History reviewed. No pertinent family history.  Social History Social History   Tobacco Use  . Smoking status: Current Every Day Smoker    Types: Cigarettes  . Smokeless tobacco: Never Used  Substance Use Topics  . Alcohol use: Yes  . Drug use: Yes    Types: Marijuana     Allergies   Patient has no known allergies.   Review of Systems Review of Systems  Unable to perform ROS: Mental status change  Constitutional: Negative for chills and fever.  HENT: Negative for congestion and facial swelling.   Eyes: Negative for discharge and visual disturbance.  Respiratory: Negative for shortness of breath.   Cardiovascular: Negative for chest pain and palpitations.  Gastrointestinal: Negative for abdominal pain, diarrhea and vomiting.  Musculoskeletal: Negative for arthralgias and myalgias.  Skin: Negative for color change and rash.  Neurological: Negative for tremors, syncope and headaches.  Psychiatric/Behavioral: Negative for confusion, dysphoric mood and suicidal ideas.     Physical Exam Updated Vital Signs BP (!) 173/111 (BP Location: Left Arm)   Pulse 84  Temp 98.6 F (37 C) (Oral)   Resp 16   Ht 6\' 3"  (1.905 m)   Wt 95.3 kg   SpO2 100%   BMI 26.25 kg/m   Physical Exam Vitals signs and nursing note reviewed.  Constitutional:      Appearance: He is well-developed.  HENT:     Head: Normocephalic and atraumatic.  Eyes:     Pupils: Pupils are equal, round, and reactive to light.  Neck:     Musculoskeletal: Normal range of motion and neck supple.     Vascular: No JVD.  Cardiovascular:     Rate and Rhythm: Normal rate and regular rhythm.     Heart sounds: No  murmur. No friction rub. No gallop.   Pulmonary:     Effort: No respiratory distress.     Breath sounds: No wheezing.  Abdominal:     General: There is no distension.     Tenderness: There is no guarding or rebound.  Musculoskeletal: Normal range of motion.  Skin:    Coloration: Skin is not pale.     Findings: No rash.  Neurological:     Mental Status: He is alert and oriented to person, place, and time.  Psychiatric:        Attention and Perception: He is inattentive.        Speech: Speech is tangential.        Behavior: Behavior is cooperative.        Thought Content: Thought content does not include homicidal or suicidal ideation. Thought content does not include homicidal or suicidal plan.     Comments: The patient is slow to respond, seems to be looking at things that are not there.  Sometimes have to repeat my statement before he will answer me.      ED Treatments / Results  Labs (all labs ordered are listed, but only abnormal results are displayed) Labs Reviewed  COMPREHENSIVE METABOLIC PANEL - Abnormal; Notable for the following components:      Result Value   Creatinine, Ser 1.31 (*)    Total Protein 8.6 (*)    Albumin 5.1 (*)    AST 52 (*)    All other components within normal limits  ACETAMINOPHEN LEVEL - Abnormal; Notable for the following components:   Acetaminophen (Tylenol), Serum <10 (*)    All other components within normal limits  ETHANOL  SALICYLATE LEVEL  CBC  RAPID URINE DRUG SCREEN, HOSP PERFORMED    EKG None  Radiology No results found.  Procedures Procedures (including critical care time)  Medications Ordered in ED Medications  LORazepam (ATIVAN) injection 0-4 mg (4 mg Intravenous Given 02/28/19 2038)    Or  LORazepam (ATIVAN) tablet 0-4 mg ( Oral See Alternative 02/28/19 2038)  LORazepam (ATIVAN) injection 0-4 mg (has no administration in time range)    Or  LORazepam (ATIVAN) tablet 0-4 mg (has no administration in time range)   thiamine (VITAMIN B-1) tablet 100 mg (has no administration in time range)    Or  thiamine (B-1) injection 100 mg (has no administration in time range)  ondansetron (ZOFRAN) tablet 4 mg (has no administration in time range)  acetaminophen (TYLENOL) tablet 650 mg (has no administration in time range)  OLANZapine (ZYPREXA) injection 10 mg (has no administration in time range)     Initial Impression / Assessment and Plan / ED Course  I have reviewed the triage vital signs and the nursing notes.  Pertinent labs & imaging results that were available  during my care of the patient were reviewed by me and considered in my medical decision making (see chart for details).        33 yo M with a history of bipolar disorder comes in brought by friends for suppose of mania.  Patient might be hallucinating on my initial exam.  With a history of mental illness I feel he is likely medically clear and will have TTS evaluate.  Patient became agitated once he went back to the psychiatric hold area.  Tore down the curtain and required multiple police officers to stay in his room.  Will give dose of olanzapine IM.  Lab work reviewed with mild aki, no other concerning findings.  Feel medically clear.   The patients results and plan were reviewed and discussed.   Any x-rays performed were independently reviewed by myself.   Differential diagnosis were considered with the presenting HPI.  Medications  LORazepam (ATIVAN) injection 0-4 mg (4 mg Intravenous Given 02/28/19 2038)    Or  LORazepam (ATIVAN) tablet 0-4 mg ( Oral See Alternative 02/28/19 2038)  LORazepam (ATIVAN) injection 0-4 mg (has no administration in time range)    Or  LORazepam (ATIVAN) tablet 0-4 mg (has no administration in time range)  thiamine (VITAMIN B-1) tablet 100 mg (has no administration in time range)    Or  thiamine (B-1) injection 100 mg (has no administration in time range)  ondansetron (ZOFRAN) tablet 4 mg (has no  administration in time range)  acetaminophen (TYLENOL) tablet 650 mg (has no administration in time range)  OLANZapine (ZYPREXA) injection 10 mg (has no administration in time range)    Vitals:   02/28/19 1858 02/28/19 1902  BP: (!) 173/111   Pulse: 84   Resp: 16   Temp: 98.6 F (37 C)   TempSrc: Oral   SpO2: 100%   Weight:  95.3 kg  Height:   (1.905 m)    Final diagnoses:  Mania [facility])      Final Clinical Impressions(s) / ED Diagnoses   Final diagnoses:  Mania [facility])    ED Discharge Orders    None       Melene Plan, DO 02/28/19 2209

## 2019-02-28 NOTE — ED Notes (Signed)
Bed: [facility]PT4 Expected date:  Expected time:  Means of arrival:  Comments: 

## 2019-02-28 NOTE — Progress Notes (Addendum)
Received Ples from the main ED, alert, disorganized and mute. He is unable to follow directions. He ripped the curtains from the window in his room and covered himself. He eventually took his clothes off with the assistance of Toniann Fail. He continued to be disorganized, walking out of his room as if trying to find a way to leave and grabbing items from the IV cart. He was medicated by Toniann Fail with Ativan 4 mg and eventually slept. He woke up at 0200 hrs and begin to try access the sharps container and take the monitor off the wall. He was medicated with Zyprexa 10 mg in his right deltoid  And transported to the [facility] for safety.

## 2019-03-01 ENCOUNTER — Other Ambulatory Visit: Payer: Self-pay | Admitting: Registered Nurse

## 2019-03-01 ENCOUNTER — Inpatient Hospital Stay (HOSPITAL_COMMUNITY)
Admission: AD | Admit: 2019-03-01 | Discharge: 2019-03-06 | DRG: 885 | Disposition: A | Discharge status: Home / Self Care | Payer: No Typology Code available for payment source | Source: Intra-hospital | Attending: Psychiatry | Admitting: Psychiatry

## 2019-03-01 ENCOUNTER — Other Ambulatory Visit: Payer: Self-pay

## 2019-03-01 ENCOUNTER — Encounter (HOSPITAL_COMMUNITY): Payer: Self-pay

## 2019-03-01 DIAGNOSIS — F1010 Alcohol abuse, uncomplicated: Secondary | ICD-10-CM | POA: Diagnosis present

## 2019-03-01 DIAGNOSIS — F319 Bipolar disorder, unspecified: Secondary | ICD-10-CM | POA: Diagnosis present

## 2019-03-01 DIAGNOSIS — Z9114 Patient's other noncompliance with medication regimen: Secondary | ICD-10-CM | POA: Diagnosis not present

## 2019-03-01 DIAGNOSIS — F1220 Cannabis dependence, uncomplicated: Secondary | ICD-10-CM | POA: Diagnosis present

## 2019-03-01 DIAGNOSIS — F12129 Cannabis abuse with intoxication, unspecified: Secondary | ICD-10-CM | POA: Diagnosis not present

## 2019-03-01 DIAGNOSIS — X58XXXA Exposure to other specified factors, initial encounter: Secondary | ICD-10-CM | POA: Diagnosis present

## 2019-03-01 DIAGNOSIS — Z79899 Other long term (current) drug therapy: Secondary | ICD-10-CM | POA: Diagnosis not present

## 2019-03-01 DIAGNOSIS — F309 Manic episode, unspecified: Secondary | ICD-10-CM | POA: Diagnosis present

## 2019-03-01 DIAGNOSIS — I10 Essential (primary) hypertension: Secondary | ICD-10-CM | POA: Diagnosis present

## 2019-03-01 DIAGNOSIS — Z23 Encounter for immunization: Secondary | ICD-10-CM | POA: Diagnosis not present

## 2019-03-01 DIAGNOSIS — F312 Bipolar disorder, current episode manic severe with psychotic features: Secondary | ICD-10-CM | POA: Diagnosis present

## 2019-03-01 DIAGNOSIS — F17210 Nicotine dependence, cigarettes, uncomplicated: Secondary | ICD-10-CM | POA: Diagnosis not present

## 2019-03-01 DIAGNOSIS — S60512A Abrasion of left hand, initial encounter: Secondary | ICD-10-CM | POA: Diagnosis present

## 2019-03-01 LAB — RAPID URINE DRUG SCREEN, HOSP PERFORMED
Amphetamines: NOT DETECTED
Barbiturates: NOT DETECTED
Benzodiazepines: POSITIVE — AB
Cocaine: NOT DETECTED
Opiates: NOT DETECTED
Tetrahydrocannabinol: POSITIVE — AB

## 2019-03-01 MED ORDER — STERILE WATER FOR INJECTION IJ SOLN
INTRAMUSCULAR | Status: AC
  Administered 2019-03-01: 10:00:00
  Filled 2019-03-01: qty 10

## 2019-03-01 MED ORDER — LORAZEPAM 2 MG/ML IJ SOLN
2.0000 mg | Freq: Once | INTRAMUSCULAR | Status: AC | PRN
  Administered 2019-03-01: 2 mg via INTRAMUSCULAR
  Filled 2019-03-01: qty 1

## 2019-03-01 MED ORDER — OLANZAPINE 5 MG PO TBDP: 5.0000 mg | ORAL_TABLET | Freq: Three times a day (TID) | ORAL | Status: DC | PRN

## 2019-03-01 MED ORDER — CLONIDINE HCL 0.1 MG PO TABS
0.1000 mg | ORAL_TABLET | Freq: Every day | ORAL | Status: DC
  Administered 2019-03-01: 0.1 mg via ORAL
  Filled 2019-03-01: qty 1

## 2019-03-01 MED ORDER — DIPHENHYDRAMINE HCL 50 MG/ML IJ SOLN
50.0000 mg | Freq: Once | INTRAMUSCULAR | Status: AC
  Administered 2019-03-01: 50 mg via INTRAMUSCULAR
  Filled 2019-03-01 (×2): qty 1

## 2019-03-01 MED ORDER — STERILE WATER FOR INJECTION IJ SOLN
INTRAMUSCULAR | Status: AC
  Administered 2019-03-01: 02:00:00
  Filled 2019-03-01: qty 10

## 2019-03-01 MED ORDER — NICOTINE POLACRILEX 2 MG MT GUM: 2.0000 mg | CHEWING_GUM | OROMUCOSAL | Status: DC | PRN

## 2019-03-01 MED ORDER — DIVALPROEX SODIUM 500 MG PO DR TAB
500.0000 mg | DELAYED_RELEASE_TABLET | Freq: Two times a day (BID) | ORAL | Status: DC
  Administered 2019-03-01: 500 mg via ORAL
  Filled 2019-03-01: qty 1

## 2019-03-01 MED ORDER — ZIPRASIDONE MESYLATE 20 MG IM SOLR
20.0000 mg | Freq: Once | INTRAMUSCULAR | Status: AC
  Administered 2019-03-01: 20 mg via INTRAMUSCULAR
  Filled 2019-03-01 (×2): qty 20

## 2019-03-01 MED ORDER — ZIPRASIDONE MESYLATE 20 MG IM SOLR: 20.0000 mg | Freq: Once | INTRAMUSCULAR | Status: DC | PRN

## 2019-03-01 MED ORDER — LORAZEPAM 2 MG/ML IJ SOLN
2.0000 mg | Freq: Once | INTRAMUSCULAR | Status: AC
  Administered 2019-03-01: 2 mg via INTRAMUSCULAR
  Filled 2019-03-01: qty 1

## 2019-03-01 MED ORDER — QUETIAPINE FUMARATE 100 MG PO TABS
100.0000 mg | ORAL_TABLET | Freq: Every day | ORAL | Status: DC
  Administered 2019-03-01: 100 mg via ORAL
  Filled 2019-03-01 (×3): qty 1

## 2019-03-01 MED ORDER — ZIPRASIDONE MESYLATE 20 MG IM SOLR
20.0000 mg | INTRAMUSCULAR | Status: AC | PRN
  Administered 2019-03-03: 20 mg via INTRAMUSCULAR

## 2019-03-01 MED ORDER — ZIPRASIDONE MESYLATE 20 MG IM SOLR
20.0000 mg | Freq: Once | INTRAMUSCULAR | Status: AC | PRN
  Administered 2019-03-01: 20 mg via INTRAMUSCULAR
  Filled 2019-03-01: qty 20

## 2019-03-01 MED ORDER — INFLUENZA VAC SPLIT QUAD 0.5 ML IM SUSY
0.5000 mL | PREFILLED_SYRINGE | INTRAMUSCULAR | Status: AC
  Administered 2019-03-02: 0.5 mL via INTRAMUSCULAR
  Filled 2019-03-01: qty 0.5

## 2019-03-01 MED ORDER — LORAZEPAM 1 MG PO TABS
1.0000 mg | ORAL_TABLET | ORAL | Status: AC | PRN
  Administered 2019-03-02 – 2019-03-03 (×2): 1 mg via ORAL
  Filled 2019-03-01: qty 1

## 2019-03-01 MED ORDER — LORAZEPAM 2 MG/ML IJ SOLN: 2.0000 mg | Freq: Once | INTRAMUSCULAR | Status: DC | PRN

## 2019-03-01 MED ORDER — OLANZAPINE 10 MG PO TABS
10.0000 mg | ORAL_TABLET | Freq: Every day | ORAL | Status: DC
  Administered 2019-03-01: 10 mg via ORAL
  Filled 2019-03-01: qty 1

## 2019-03-01 NOTE — Progress Notes (Signed)
Patient ID: John [name], male   DOB: September 15, 1986, 33 y.o.   MRN: [id number]  Pt is in his room sleeping and has remained calm since receiving emergency medications this morning. He is taking PO medications without incident. Will continue to monitor.   Laveda Abbe, FNP-C 03/01/2019      1440

## 2019-03-01 NOTE — ED Notes (Signed)
Pt talking on hallway phone.  

## 2019-03-01 NOTE — Progress Notes (Signed)
   03/01/19 2334  Dynamic Appraisal of Situational Aggression  Irritability 1  Impulsivity 1  Unwillingness to Follow Directions 1  Sensitivity to Perceived Provocation 1  Easily Angered When Requests are Denied 1  Negative Attitudes 1  Verbal Threats 0  Total DASA Score 6  Final Risk Rating Imminent Risk  Record of Aggression in the last 24 hours  Physical Aggression against OBJECTS No  Verbal Aggression against OTHER PEOPLE No  Physical Aggression against OTHER PEOPLE No     Admission Note:   Cheston Rosenquist (prefers to be call Theodoro Grist) is a 33 y.o male admitted to [facility] for manic behavior. Pt denies SI/HI/AVH/Pain at this time. On admit, Pt was apprehensive about signing admission paperwork. Pt had a hard time focusing. Pt presents with pressured speech and unsteady gait. Pt observe closing his eyes when walking. Upon coming back from search room, Pt jolted towards the nurses station. Pt is impulsive and does not respond to re-direction. Pt push STARR button in his room and was adamant about taking a shower now. Writer told Pt that he could take one in the a.m. due to unsteady gait. Pt became increasingly agitated and walk out to NS and sat on the floor; refusing to go back to room. Security, A.C. and provider on call notified; with a show of force present; Pt walk back to room and was given medications. See MAR. EKG completed and place in front of chart. Fluids and a meal tray provided and left by bedside. Pt search and skin assessed and found to be WNL. Support provided. Will continue with POC.

## 2019-03-01 NOTE — Progress Notes (Signed)
Received John [name] this PM at the change of shift in the bathroom completing his shower. He remains restless, but redirectable. He was given a snack. Report was called to nurse Irving Burton at [facility]. Transport - [employer] was notified per protocol. He was medicated with his HS medications. He was transported without incident at 2120 hrs without incident.

## 2019-03-01 NOTE — ED Notes (Signed)
Pt behavior escalating, increased anxiety. Pt pacing the hallways , walking into unoccupied rooms on the unit, intrusive, pt behavior not redirectable, speech pressured, disorganized, pulling emergency alarms in pt rooms. This nurse notified Jacki Cones NP of pt escalating behaviors.

## 2019-03-01 NOTE — Tx Team (Signed)
Initial Treatment Plan 03/01/2019 11:36 PM [name] TMH:[id number]    PATIENT STRESSORS: Financial difficulties Health problems Medication change or noncompliance Substance abuse   PATIENT STRENGTHS: Communication skills Supportive family/friends   PATIENT IDENTIFIED PROBLEMS: "Psychosis"   "Medication noncompliance"                      DISCHARGE CRITERIA:  Ability to meet basic life and health needs Improved stabilization in mood, thinking, and/or behavior Verbal commitment to aftercare and medication compliance  PRELIMINARY DISCHARGE PLAN: Attend PHP/IOP Outpatient therapy Return to previous living arrangement  PATIENT/FAMILY INVOLVEMENT: This treatment plan has been presented to and reviewed with the patient, John [name]. The patient have been given the opportunity to ask questions and make suggestions.  Tyrone Apple, RN 03/01/2019, 11:36 PM

## 2019-03-01 NOTE — ED Notes (Signed)
Pt manic, pt refuses to answers questions. Pt refues to listen to staff. Dr Judd Lien made aware. Pt pulled down to another room. Security assisted pt back to his room pt giving 2 mg ativan po. Pt currently laying on floor in room. Pt safe will continue to monitor.

## 2019-03-01 NOTE — ED Notes (Signed)
Pt taking a shower 

## 2019-03-01 NOTE — BH Assessment (Signed)
[facility] Assessment Progress Note  Per Juanetta Beets, DO, this pt requires psychiatric hospitalization at this time.  Pt presents under IVC initiated by a friend of the pt, and upheld by EDP Melene Plan, DO.  The following facilities have been contacted to seek placement for this pt, with results as noted:  Beds available, information sent, decision pending:  [facility] [employer] [facility] [place] [facility]   Declined:  [facility] (pt too acute for milieu at this time)   At capacity:  [facility]    [place], [place] [facility] Coordinator [phone]

## 2019-03-01 NOTE — BH Assessment (Signed)
Pt behavior improved, per RN calm and following verbal redirection. Accepted to [facility]. Room 505-1. May arrive at 9-930 pm .Accepting MD- Sharma Covert.  Attending MD - Jeannine Kitten. Phone report to [phone]

## 2019-03-01 NOTE — Consult Note (Addendum)
[facility] Face-to-Face Psychiatry Consult   Reason for Consult: Manic behavior Referring Physician:  EDP Patient Identification: John [name] MRN:  [id number] Principal Diagnosis: Bipolar affective disorder, manic, severe, with psychotic behavior (HCC) Diagnosis:  Principal Problem:   Bipolar affective disorder, manic, severe, with psychotic behavior (HCC) Active Problems:   Mania (HCC)   Total Time spent with patient: 30 minutes  Subjective:   John [name] is a 33 y.o. male patient admitted with manic and bizarre behavior at home.  HPI:  Pt was seen and chart reviewed with treatment team and Dr Sharma Covert. Pt is currently experiencing an episode of manic behavior. He has a history of Bipolar disorder. His thought process is disorganized. He is seen in the milieu with a blanket over his head. He was emergently medicated this morning due to his behavior, walking into other rooms, pacing, and pulling the emergency alarm. He is not easily redirected. He was in the [facility] over the weekend and stabilized on Zyprexa. He was discharged. His UDS is positive for THC and BAL are negative. Will restart Zyprexa and Depakote and continue to monitor. He currently is in his room and sleeping. According to notes in chart his roommate reported he has not been sleeping. Pt would benefit form an inpatient psychiatric admission for further stabilization and medication management.    Past Psychiatric History: As above  Risk to Self: Suicidal Ideation: No Suicidal Intent: No Is patient at risk for suicide?: No Suicidal Plan?: No Access to Means: No What has been your use of drugs/alcohol within the last 12 months?: ETOH How many times?: 1 Other Self Harm Risks: Yes Triggers for Past Attempts: Unknown Intentional Self Injurious Behavior: Cutting Comment - Self Injurious Behavior: Per IVC, pt broke glass and tried to lay down in it. Risk to Others: Homicidal Ideation: No Thoughts of Harm to Others: No Current  Homicidal Intent: No Current Homicidal Plan: No Access to Homicidal Means: No Identified Victim: No one History of harm to others?: No Assessment of Violence: None Noted Violent Behavior Description: (Unknown) Does patient have access to weapons?: No Criminal Charges Pending?: No Does patient have a court date: No Prior Inpatient Therapy: Prior Inpatient Therapy: Yes Prior Therapy Dates: 2018, 2015, 2014 Prior Therapy Facilty/Provider(s): [facility], [facility] Reason for Treatment: MH issues Prior Outpatient Therapy: Prior Outpatient Therapy: No(Has gone to [facility] in the past per roommate.) Does patient have an ACCT team?: No Does patient have Intensive In-House Services?  : No Does patient have [facility] services? : No Does patient have P4CC services?: No  Past Medical History:  Past Medical History:  Diagnosis Date  . Bipolar 1 disorder (HCC)   . Mental disorder     Past Surgical History:  Procedure Laterality Date  . WISDOM TOOTH EXTRACTION     Family History: History reviewed. No pertinent family history. Family Psychiatric  History: None  Social History:  Social History   Substance and Sexual Activity  Alcohol Use Yes     Social History   Substance and Sexual Activity  Drug Use Yes  . Types: Marijuana    Social History   Socioeconomic History  . Marital status: Single    Spouse name: Not on file  . Number of children: Not on file  . Years of education: Not on file  . Highest education level: Not on file  Occupational History  . Not on file  Social Needs  . Financial resource strain: Not on file  . Food insecurity:    Worry:  Not on file    Inability: Not on file  . Transportation needs:    Medical: Not on file    Non-medical: Not on file  Tobacco Use  . Smoking status: Current Every Day Smoker    Types: Cigarettes  . Smokeless tobacco: Never Used  Substance and Sexual Activity  . Alcohol use: Yes  . Drug use: Yes    Types: Marijuana  . Sexual activity: Not  on file  Lifestyle  . Physical activity:    Days per week: Not on file    Minutes per session: Not on file  . Stress: Not on file  Relationships  . Social connections:    Talks on phone: Not on file    Gets together: Not on file    Attends religious service: Not on file    Active member of club or organization: Not on file    Attends meetings of clubs or organizations: Not on file    Relationship status: Not on file  Other Topics Concern  . Not on file  Social History Narrative  . Not on file   Additional Social History: N/A    Allergies:  No Known Allergies  Labs:  Results for orders placed or performed during the hospital encounter of 02/28/19 (from the past 48 hour(s))  Comprehensive metabolic panel     Status: Abnormal   Collection Time: 02/28/19  7:08 PM  Result Value Ref Range   Sodium 139 135 - 145 mmol/L   Potassium 4.3 3.5 - 5.1 mmol/L   Chloride 100 98 - 111 mmol/L   CO2 27 22 - 32 mmol/L   Glucose, Bld 85 70 - 99 mg/dL   BUN 18 6 - 20 mg/dL   Creatinine, Ser 1.30 (H) 0.61 - 1.24 mg/dL   Calcium 9.8 8.9 - 86.5 mg/dL   Total Protein 8.6 (H) 6.5 - 8.1 g/dL   Albumin 5.1 (H) 3.5 - 5.0 g/dL   AST 52 (H) 15 - 41 U/L   ALT 34 0 - 44 U/L   Alkaline Phosphatase 64 38 - 126 U/L   Total Bilirubin 0.9 0.3 - 1.2 mg/dL   GFR calc non Af Amer >60 >60 mL/min   GFR calc Af Amer >60 >60 mL/min   Anion gap 12 5 - 15    Comment: Performed at [facility], [address] [place], [place] [address]  Ethanol     Status: None   Collection Time: 02/28/19  7:08 PM  Result Value Ref Range   Alcohol, Ethyl (B) <10 <10 mg/dL    Comment: (NOTE) Lowest detectable limit for serum alcohol is 10 mg/dL. For medical purposes only. Performed at [facility], [address], [place], [place] [address]   Salicylate level     Status: None   Collection Time: 02/28/19  7:08 PM  Result Value Ref Range   Salicylate Lvl <7.0 2.8 - 30.0 mg/dL     Comment: Performed at [facility], [address], [place], [place] [address]  Acetaminophen level     Status: Abnormal   Collection Time: 02/28/19  7:08 PM  Result Value Ref Range   Acetaminophen (Tylenol), Serum <10 (L) 10 - 30 ug/mL    Comment: (NOTE) Therapeutic concentrations vary significantly. A range of 10-30 ug/mL  may be an effective concentration for many patients. However, some  are best treated at concentrations outside of this range. Acetaminophen concentrations >150 ug/mL at 4 hours after ingestion  and >50 ug/mL at 12 hours after ingestion are often associated with  toxic reactions. Performed at [facility], [address], [place], [place] [address]   cbc     Status: None   Collection Time: 02/28/19  7:08 PM  Result Value Ref Range   WBC 8.7 4.0 - 10.5 K/uL   RBC 5.09 4.22 - 5.81 MIL/uL   Hemoglobin 15.5 13.0 - 17.0 g/dL   HCT 18.8 41.6 - 60.6 %   MCV 91.7 80.0 - 100.0 fL   MCH 30.5 26.0 - 34.0 pg   MCHC 33.2 30.0 - 36.0 g/dL   RDW 30.1 60.1 - 09.3 %   Platelets 263 150 - 400 K/uL   nRBC 0.0 0.0 - 0.2 %    Comment: Performed at [facility], [address] [place], [place] [address]    Current Facility-Administered Medications  Medication Dose Route Frequency Provider Last Rate Last Dose  . acetaminophen (TYLENOL) tablet 650 mg  650 mg Oral Q4H PRN Melene Plan, DO      . LORazepam (ATIVAN) injection 0-4 mg  0-4 mg Intravenous Q6H Melene Plan, DO   4 mg at 02/28/19 2038   Or  . LORazepam (ATIVAN) tablet 0-4 mg  0-4 mg Oral Q6H Melene Plan, DO   2 mg at 03/01/19 3220  . [START ON 03/03/2019] LORazepam (ATIVAN) injection 0-4 mg  0-4 mg Intravenous Q12H Melene Plan, DO       Or  . Melene Muller ON 03/03/2019] LORazepam (ATIVAN) tablet 0-4 mg  0-4 mg Oral Q12H Melene Plan, DO      . ondansetron [facility]) tablet 4 mg  4 mg Oral Q8H PRN Melene Plan, DO      . thiamine (VITAMIN B-1) tablet 100 mg  100 mg Oral Daily Melene Plan, DO       Or  . thiamine (B-1) injection 100 mg  100 mg Intravenous Daily Melene Plan, DO       Current Outpatient Medications  Medication Sig Dispense Refill  . Multiple Vitamin (MULTIVITAMIN WITH MINERALS) TABS Take 1 tablet by mouth daily. 30 tablet 0  . Omega-3 Fatty Acids (FISH OIL) 500 MG CAPS Take 1,000 mg by mouth daily.    . divalproex (DEPAKOTE) 500 MG DR tablet Take 1 tablet (500 mg total) by mouth every 12 (twelve) hours. (Patient not taking: Reported on 02/24/2019) 60 tablet 0    Musculoskeletal: Strength & Muscle Tone: within normal limits Gait & Station: normal Patient leans: N/A  Psychiatric Specialty Exam: Physical Exam  Constitutional: He appears well-developed and well-nourished.  HENT:  Head: Normocephalic.  Respiratory: Effort normal.  Neurological: He is alert.  Psychiatric: His affect is labile. He is agitated.    Review of Systems  Psychiatric/Behavioral: The patient is nervous/anxious (agitated).   All other systems reviewed and are negative.   Blood pressure (!) 173/111, pulse 84, temperature 98.6 F (37 C), temperature source Oral, resp. rate 16, height 6\' 3"  (1.905 m), weight 95.3 kg, SpO2 100 %.Body mass index is 26.25 kg/m.  General Appearance: Casual, dressed in scrubs, neat   Eye Contact:  Fair  Speech:  Pressured & tangential  Volume:  Decreased  Mood:  Anxious, Irritable and agitated  Affect:  Labile  Thought Process:  Disorganized and Descriptions of Associations: Tangential  Orientation:  Full (Time, Place, and Person)  Thought Content:  Illogical and Tangential  Suicidal Thoughts:  No  Homicidal Thoughts:  No  Memory:  Immediate;  Fair Recent;   Fair Remote;   Fair  Judgement:  Impaired  Insight:  Shallow  Psychomotor Activity:  Increased  Concentration:  Concentration: Fair and Attention Span: Fair  Recall:  Fair  Fund of Knowledge:  Good  Language:  Good  Akathisia:  No  Handed:  Right  AIMS (if indicated):   N/A   Assets:  Architect Housing Social Support  ADL's:  Intact  Cognition:  Impaired,  Mild due to disorganized thought process  Sleep:   N/A     Treatment Plan Summary: Daily contact with patient to assess and evaluate symptoms and progress in treatment and Medication management   Crisis stabilization  Medication management: Restart Zyprexa 10 mg at bedtime for mood stabilization Restart Depakote 500 mg twice daily for mood control   Disposition: Recommend psychiatric Inpatient admission when medically cleared. TTS to seek placement  Laveda Abbe, NP 03/01/2019 9:55 AM   Patient seen face-to-face for psychiatric evaluation, chart reviewed and case discussed with the physician extender and developed treatment plan. Reviewed the information documented and agree with the treatment plan.  Juanetta Beets, DO 03/01/19 12:10 PM

## 2019-03-02 DIAGNOSIS — F17210 Nicotine dependence, cigarettes, uncomplicated: Secondary | ICD-10-CM

## 2019-03-02 DIAGNOSIS — F312 Bipolar disorder, current episode manic severe with psychotic features: Principal | ICD-10-CM

## 2019-03-02 DIAGNOSIS — F12129 Cannabis abuse with intoxication, unspecified: Secondary | ICD-10-CM

## 2019-03-02 MED ORDER — BENZTROPINE MESYLATE 0.5 MG PO TABS
0.5000 mg | ORAL_TABLET | Freq: Two times a day (BID) | ORAL | Status: DC
  Administered 2019-03-02 – 2019-03-06 (×9): 0.5 mg via ORAL
  Filled 2019-03-02 (×12): qty 1

## 2019-03-02 MED ORDER — FLUTICASONE PROPIONATE 50 MCG/ACT NA SUSP
2.0000 | Freq: Every day | NASAL | Status: DC
  Administered 2019-03-02 – 2019-03-06 (×5): 2 via NASAL
  Filled 2019-03-02 (×2): qty 16

## 2019-03-02 MED ORDER — DIVALPROEX SODIUM ER 500 MG PO TB24
500.0000 mg | ORAL_TABLET | Freq: Two times a day (BID) | ORAL | Status: DC
  Administered 2019-03-02 – 2019-03-05 (×7): 500 mg via ORAL
  Filled 2019-03-02 (×10): qty 1

## 2019-03-02 MED ORDER — TEMAZEPAM 15 MG PO CAPS
30.0000 mg | ORAL_CAPSULE | Freq: Every day | ORAL | Status: DC
  Administered 2019-03-02 – 2019-03-05 (×4): 30 mg via ORAL
  Filled 2019-03-02 (×2): qty 2
  Filled 2019-03-02: qty 4

## 2019-03-02 MED ORDER — RISPERIDONE 3 MG PO TABS
3.0000 mg | ORAL_TABLET | Freq: Two times a day (BID) | ORAL | Status: DC
  Administered 2019-03-02 – 2019-03-06 (×9): 3 mg via ORAL
  Filled 2019-03-02 (×6): qty 1
  Filled 2019-03-02: qty 3
  Filled 2019-03-02 (×5): qty 1

## 2019-03-02 NOTE — H&P (Signed)
Psychiatric Admission Assessment Adult  Patient Identification: John [name] MRN:  [id number] Date of Evaluation:  03/02/2019 Chief Complaint:  bipolar affective disorder manic with psychotic features Principal Diagnosis: Bipolar manic with psychosis/cannabis dependency Diagnosis:  Active Problems:   Bipolar affective disorder (HCC)  History of Present Illness:   This is the latest of multiple admissions and encounters in this healthcare system for John [name], 33 year old single patient is known to have a bipolar condition complicated by chronic cannabis dependency. He presented last week through the emergency department on 3/14 with disorganized and even dangerous behaviors but was stabilized very well there and attained a baseline status and was released on 3/15, however he did not comply with outpatient recommendations or medications and re-presented on 3/18 once again with manic symptoms.  He was described as tearing things up in the home by his roommate, running into traffic, and was consistently disorganized, requiring near constant redirection the emergency department and even threatening at times. He had received IM Geodon sublingual Zyprexa and IM Ativan in the emergency department.  At the present time he is in bed he makes no eye contact but he is conversant he is oriented to person place and situation and his chief complaint is that he is here to "get his medicines straightened out" he recalls being on Depakote he recalls no other medications.  Depakote has been his most consistent bipolar therapy. He acknowledges cannabis usage probable dependency but will not acknowledge that fully He denies currently wanting to harm himself or others and he answers most questions in a very vague fashion, stating that he is here "to get better" that he is here to "get rest" but will not elaborate on specific symptoms.  He denies ever being diagnosed with schizoaffective disorder, bipolar disorder,  depression, or chemical dependency issues.  He states he is safe here and will contract for safety here seems understand what the question means did not answer when asked if he would contract fully   According to our assessment team note of 3/18 John [name] is an 33 y.o. male.  -Clinician reviewed note by Dr. Adela Lank.  Pt is a 33 yo M with a significant past medical history of bipolar disorder comes in with a chief complaint of mania. This was noted by his friends, felt he was acting bizarrely and so brought him into the ED. Patient states that he has no suicidal homicidal ideation. Admits that he has not been taking his medication for some time, feels that it is more mind over matter. States that he is able to calm his thinking and that he is able to control his actions. He denies cough congestion fevers chills or myalgias. Has been drinking alcohol today. Level 5 status caveat altered mental status.  Patient became agitated once he went back to the psychiatric hold area. Tore down the curtain and required multiple police officers to stay in his room. Will give dose of olanzapine IM.  Patient had been given 4mg  Ativan at 20:38 and is unable to participate in assessment at this time.  Clinician gathered information from Epic and petitioner.  Pt is on IVC.  Patient had been on IVC on 02/24/19 and had been seen by TTS counselor John [name] at that time.  Clinician called Marcha Solders [phone], who is a roommate of patient.  He said patient has not been sleeping well for the last few days.  Today he drank an unknown amount of ETOH and drove.  Patient has also been tearing  up things in the home.  He has been running into traffic.  He said that patient has been agitated for the last 2 weeks or so.  It has escalated over the last few days.    Roommate also said that patient has not voiced SI or HI.  He would talk about having "big plans" and trying to constantly be in motion and cleaning  things also.  He reports that patient does not drink often and usually only socially.  Unsure of how much he may have consumed today.  Patient has been to [facility] in the past and had told roommate he felt he may need to go back there.  Patient has been to [facility] in 06/2013 and at [facility] in the past too.  -Patient discussed with Donell Sievert, PA.  He recommended inpatient care for stabilization.  Patient is on IVC.  Clinician talked with Dr. Adela Lank who is willing to complete the 1st opinion. Associated Signs/Symptoms: Depression Symptoms:  psychomotor agitation, (Hypo) Manic Symptoms:  Flight of Ideas, Anxiety Symptoms:  n/a Psychotic Symptoms:  Disorganization of thought and behavior without form delusions expressed PTSD Symptoms: Negative NA Total Time spent with patient: 45 minutes  Past Psychiatric History: Extensive use of cannabis and chronic poor compliance  Is the patient at risk to self? Yes.    Has the patient been a risk to self in the past 6 months? No.  Has the patient been a risk to self within the distant past? Yes.    Is the patient a risk to others? Yes.    Has the patient been a risk to others in the past 6 months? No.  Has the patient been a risk to others within the distant past? Yes.     Prior Inpatient Therapy:   Prior Outpatient Therapy:    Alcohol Screening: 1. How often do you have a drink containing alcohol?: Monthly or less 2. How many drinks containing alcohol do you have on a typical day when you are drinking?: 3 or 4 3. How often do you have six or more drinks on one occasion?: Monthly AUDIT-C Score: 4 4. How often during the last year have you found that you were not able to stop drinking once you had started?: Never 5. How often during the last year have you failed to do what was normally expected from you becasue of drinking?: Less than monthly 6. How often during the last year have you needed a first drink in the morning to get yourself going after a  heavy drinking session?: Less than monthly 7. How often during the last year have you had a feeling of guilt of remorse after drinking?: Never 8. How often during the last year have you been unable to remember what happened the night before because you had been drinking?: Never 9. Have you or someone else been injured as a result of your drinking?: No 10. Has a relative or friend or a doctor or another health worker been concerned about your drinking or suggested you cut down?: No Alcohol Use Disorder Identification Test Final Score (AUDIT): 6 Substance Abuse History in the last 12 months:  Yes.   Consequences of Substance Abuse: NA Previous Psychotropic Medications: Yes  Psychological Evaluations: No  Past Medical History:  Past Medical History:  Diagnosis Date  . Bipolar 1 disorder (HCC)   . Mental disorder     Past Surgical History:  Procedure Laterality Date  . WISDOM TOOTH EXTRACTION     Family  History: History reviewed. No pertinent family history. Family Psychiatric  History: ukn Tobacco Screening: Have you used any form of tobacco in the last 30 days? (Cigarettes, Smokeless Tobacco, Cigars, and/or Pipes): Yes Tobacco use, Select all that apply: 4 or less cigarettes per day Are you interested in Tobacco Cessation Medications?: Yes, will notify MD for an order Counseled patient on smoking cessation including recognizing danger situations, developing coping skills and basic information about quitting provided: Yes Social History:  Social History   Substance and Sexual Activity  Alcohol Use Yes     Social History   Substance and Sexual Activity  Drug Use Yes  . Types: Marijuana    Additional Social History:                           Allergies:  No Known Allergies Lab Results:  Results for orders placed or performed during the hospital encounter of 02/28/19 (from the past 48 hour(s))  Comprehensive metabolic panel     Status: Abnormal   Collection Time:  02/28/19  7:08 PM  Result Value Ref Range   Sodium 139 135 - 145 mmol/L   Potassium 4.3 3.5 - 5.1 mmol/L   Chloride 100 98 - 111 mmol/L   CO2 27 22 - 32 mmol/L   Glucose, Bld 85 70 - 99 mg/dL   BUN 18 6 - 20 mg/dL   Creatinine, Ser 1.61 (H) 0.61 - 1.24 mg/dL   Calcium 9.8 8.9 - 09.6 mg/dL   Total Protein 8.6 (H) 6.5 - 8.1 g/dL   Albumin 5.1 (H) 3.5 - 5.0 g/dL   AST 52 (H) 15 - 41 U/L   ALT 34 0 - 44 U/L   Alkaline Phosphatase 64 38 - 126 U/L   Total Bilirubin 0.9 0.3 - 1.2 mg/dL   GFR calc non Af Amer >60 >60 mL/min   GFR calc Af Amer >60 >60 mL/min   Anion gap 12 5 - 15    Comment: Performed at [facility], [address] [place], [place] [address]  Ethanol     Status: None   Collection Time: 02/28/19  7:08 PM  Result Value Ref Range   Alcohol, Ethyl (B) <10 <10 mg/dL    Comment: (NOTE) Lowest detectable limit for serum alcohol is 10 mg/dL. For medical purposes only. Performed at [facility], [address] [place], [place] [address]   Salicylate level     Status: None   Collection Time: 02/28/19  7:08 PM  Result Value Ref Range   Salicylate Lvl <7.0 2.8 - 30.0 mg/dL    Comment: Performed at [facility], [address] [place], [place] [address]  Acetaminophen level     Status: Abnormal   Collection Time: 02/28/19  7:08 PM  Result Value Ref Range   Acetaminophen (Tylenol), Serum <10 (L) 10 - 30 ug/mL    Comment: (NOTE) Therapeutic concentrations vary significantly. A range of 10-30 ug/mL  may be an effective concentration for many patients. However, some  are best treated at concentrations outside of this range. Acetaminophen concentrations >150 ug/mL at 4 hours after ingestion  and >50 ug/mL at 12 hours after ingestion are often associated with  toxic reactions. Performed at [facility], [address] [place], [place] [address]   cbc     Status: None   Collection Time: 02/28/19   7:08 PM  Result Value Ref Range   WBC  8.7 4.0 - 10.5 K/uL   RBC 5.09 4.22 - 5.81 MIL/uL   Hemoglobin 15.5 13.0 - 17.0 g/dL   HCT 16.1 09.6 - 04.5 %   MCV 91.7 80.0 - 100.0 fL   MCH 30.5 26.0 - 34.0 pg   MCHC 33.2 30.0 - 36.0 g/dL   RDW 40.9 81.1 - 91.4 %   Platelets 263 150 - 400 K/uL   nRBC 0.0 0.0 - 0.2 %    Comment: Performed at [facility], [address], [place], [place] [address]  Rapid urine drug screen (hospital performed)     Status: Abnormal   Collection Time: 03/01/19  9:18 AM  Result Value Ref Range   Opiates NONE DETECTED NONE DETECTED   Cocaine NONE DETECTED NONE DETECTED   Benzodiazepines POSITIVE (A) NONE DETECTED   Amphetamines NONE DETECTED NONE DETECTED   Tetrahydrocannabinol POSITIVE (A) NONE DETECTED   Barbiturates NONE DETECTED NONE DETECTED    Comment: (NOTE) DRUG SCREEN FOR MEDICAL PURPOSES ONLY.  IF CONFIRMATION IS NEEDED FOR ANY PURPOSE, NOTIFY LAB WITHIN 5 DAYS. LOWEST DETECTABLE LIMITS FOR URINE DRUG SCREEN Drug Class                     Cutoff (ng/mL) Amphetamine and metabolites    1000 Barbiturate and metabolites    200 Benzodiazepine                 200 Tricyclics and metabolites     300 Opiates and metabolites        300 Cocaine and metabolites        300 THC                            50 Performed at [facility], [address] [place], [place] [address]     Blood Alcohol level:  Lab Results  Component Value Date   ETH <10 02/28/2019   ETH <10 02/24/2019    Metabolic Disorder Labs:  No results found for: HGBA1C, MPG No results found for: PROLACTIN Lab Results  Component Value Date   CHOL 134 07/26/2013   TRIG 41 07/26/2013   HDL 51 07/26/2013   CHOLHDL 2.6 07/26/2013   VLDL 8 07/26/2013   LDLCALC 75 07/26/2013    Current Medications: Current Facility-Administered Medications  Medication Dose Route Frequency Provider Last Rate Last Dose  . benztropine (COGENTIN) tablet 0.5 mg   0.5 mg Oral BID Malvin Johns, MD      . divalproex (DEPAKOTE ER) 24 hr tablet 500 mg  500 mg Oral BID Malvin Johns, MD      . Influenza vac split quadrivalent PF (FLUARIX) injection 0.5 mL  0.5 mL Intramuscular Tomorrow-1000 Malvin Johns, MD      . OLANZapine zydis (ZYPREXA) disintegrating tablet 5 mg  5 mg Oral Q8H PRN Kerry Hough, PA-C       And  . LORazepam (ATIVAN) tablet 1 mg  1 mg Oral PRN Donell Sievert E, PA-C       And  . ziprasidone (GEODON) injection 20 mg  20 mg Intramuscular PRN Kerry Hough, PA-C      . nicotine polacrilex (NICORETTE) gum 2 mg  2 mg Oral PRN Malvin Johns, MD      . risperiDONE (RISPERDAL) tablet 3 mg  3 mg Oral BID Malvin Johns, MD      . temazepam (RESTORIL) capsule 30 mg  30 mg  Oral QHS Malvin Johns, MD       PTA Medications: Medications Prior to Admission  Medication Sig Dispense Refill Last Dose  . divalproex (DEPAKOTE) 500 MG DR tablet Take 1 tablet (500 mg total) by mouth every 12 (twelve) hours. (Patient not taking: Reported on 02/24/2019) 60 tablet 0 Not Taking at Unknown time  . Multiple Vitamin (MULTIVITAMIN WITH MINERALS) TABS Take 1 tablet by mouth daily. 30 tablet 0 Past Week at Unknown time  . Omega-3 Fatty Acids (FISH OIL) 500 MG CAPS Take 1,000 mg by mouth daily.   Past Week at Unknown time    Musculoskeletal: Strength & Muscle Tone: within normal limits Gait & Station: normal Patient leans: N/A  Psychiatric Specialty Exam: Physical Exam  ROS  Blood pressure (!) 150/87, pulse 96, temperature 98.6 F (37 C), temperature source Oral, resp. rate 18, height  (1.905 m), weight 95.2 kg.Body mass index is 26.23 kg/m.  General Appearance: Disheveled  Eye Contact:  None  Speech:  Slow and Slurred  Volume:  Decreased  Mood:  Dysphoric  Affect:  Blunt  Thought Process:  Irrelevant  Orientation:  Other:  Person place general situation not day date or exact time other than morning  Thought Content:  Illogical  Suicidal Thoughts:   No  Homicidal Thoughts:  No  Memory:  Immediate;   Poor  Judgement:  Impaired  Insight:  Lacking  Psychomotor Activity:  Decreased  Concentration:  Concentration: Poor  Recall:  Poor  Fund of Knowledge:  Poor  Language:  Poor  Akathisia:  Negative  Handed:  Right  AIMS (if indicated):     Assets:  Physical Health Resilience Social Support  ADL's:  Intact  Cognition:  WNL  Sleep:  Number of Hours: 5.5    Treatment Plan Summary: Daily contact with patient to assess and evaluate symptoms and progress in treatment, Medication management and Plan Meds adjusted cognitive therapy to begin reality therapy to begin current precautions 15 minutes  Observation Level/Precautions:  15 minute checks  Laboratory:  UDS  Psychotherapy: Reality based  Medications: Several adjustments  Consultations: No changes  Discharge Concerns: Long-term compliance and sobriety  Estimated LOS: 5-7  Other: Axis I bipolar manic with psychosis/cannabis dependency   Physician Treatment Plan for Primary Diagnosis: <principal problem not specified> Long Term Goal(s): Improvement in symptoms so as ready for discharge  Short Term Goals: Compliance with prescribed medications will improve  Physician Treatment Plan for Secondary Diagnosis: Active Problems:   Bipolar affective disorder (HCC)  Long Term Goal(s): Improvement in symptoms so as ready for discharge  Short Term Goals: Ability to demonstrate self-control will improve and Ability to identify and develop effective coping behaviors will improve  I certify that inpatient services furnished can reasonably be expected to improve the patient's condition.    Malvin Johns, MD 3/20/20208:23 AM

## 2019-03-02 NOTE — Progress Notes (Signed)
Recreation Therapy Notes  INPATIENT RECREATION THERAPY ASSESSMENT  Patient Details Name: Malyk Austin MRN: [id number] DOB: [birth year] Today's Date: 03/02/2019       Information Obtained From: Patient  Able to Participate in Assessment/Interview: Yes  Patient Presentation: Hyperverbal(Drowsy)  Reason for Admission (Per Patient): Other (Comments)(Pt stated he felt someone was threatening someone close to him.  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; International aid/development worker needed space.)  Coping Skills:   Isolation, TV, Music, Exercise, Meditate, Deep Breathing, Substance Abuse, Impulsivity, Talk, Art, Prayer, Read, Hot Bath/Shower  Leisure Interests (2+):  Exercise - Running, Exercise - Lifting Weights, Individual - Reading, Games - Video games, Social - Friends, Sports - Exercise (Comment)(Push ups)  Frequency of Recreation/Participation: Weekly  Awareness of Community Resources:  Yes  Community Resources:  [employer]  Current Use: No  If no, Barriers?:    Expressed Interest in [employer] Information: No  [employer] of Residence:  [place]  Patient Main Form of Transportation: Car  Patient Strengths:  Think things through; Feel people's presence; Observant  Patient Identified Areas of Improvement:  Reading skills; Time management  Patient Goal for Hospitalization:  "to understand medication, better self but don't want to be dependent on anything"  Current SI (including self-harm):  No  Current HI:  No  Current AVH: No  Staff Intervention Plan: Group Attendance, Collaborate with Interdisciplinary Treatment Team  Consent to Intern Participation: N/A    Caroll Rancher, LRT/CTRS  Lillia Abed, Marvene Strohm A 03/02/2019, 1:07 PM

## 2019-03-02 NOTE — BHH Group Notes (Signed)
Date: 03/02/19, 1315  Type of Therapy and Topic: Chaplain group, "Hope is.." Chaplain engaged group in discussion about hope and what it looks like in each patients life and current situation.  Participation level:minimal  Modes of Intervention: Discussion, Education and Socialization  Summary of Progress/Problems:Pt came to group but lay back with his eyes closed for much of it.  Pt did respond when chaplain called on him and engaged briefly on his definition of hope, but then had his eyes closed again soon after.  Pt continued to participate throughout group, however, and was clearly engaged despite often having his eyes closed.  He made several more comments during the discussion.     Lorri Frederick, LCSW   [facility] LCSW Group Therapy Note

## 2019-03-02 NOTE — Progress Notes (Signed)
Recreation Therapy Notes  Date: 3.20.20 Time: 0955 Location: [address] Dayroom  Group Topic: Wellness  Goal Area(s) Addresses:  Patient will define components of whole wellness. Patient will verbalize benefit of whole wellness.  Behavioral Response: Engaged  Intervention:  Music  Activity: Exercise.  LRT led patients in a series of stretches.  Each patient was then given the opportunity to lead the group in an exercise of their choice.  Patients could take water breaks as needed   Education: Wellness, Building control surveyor.   Education Outcome: Acknowledges education/In group clarification offered/Needs additional education.   Clinical Observations/Feedback: Pt arrived late but was engaged in activity.  Pt led group in freestyle dance moves.  Pt seemed to be doing some weird hand and arm movements but appropriate.    Caroll Rancher, LRT/CTRS     Caroll Rancher A 03/02/2019 12:38 PM

## 2019-03-02 NOTE — B[facility] Suicide Risk Assessment (Signed)
[facility] Admission Suicide Risk Assessment   Nursing information obtained from:    Demographic factors:  Male, Low socioeconomic status, Living alone Current Mental Status:  NA Loss Factors:  Financial problems / change in socioeconomic status Historical Factors:  NA Risk Reduction Factors:  Positive social support  Total Time spent with patient: 45 minutes Principal Problem: Exacerbation and underlying bipolar condition complicated by cannabis dependency resulting in volatile behaviors Diagnosis:  Active Problems:   Bipolar affective disorder (HCC)  Subjective Data: This is the latest of multiple admissions/encounters with Mr. Aujla a 33 year old patient who is presenting in the context of a manic episode complicated by substance abuse he is more organized than previously described but will not fully contract describes racing thoughts but no thoughts of harming self contracting here only  Continued Clinical Symptoms:  Alcohol Use Disorder Identification Test Final Score (AUDIT): 6 The "Alcohol Use Disorders Identification Test", Guidelines for Use in Primary Care, Second Edition.  World Science writer [facility]). Score between 0-7:  no or low risk or alcohol related problems. Score between 8-15:  moderate risk of alcohol related problems. Score between 16-19:  high risk of alcohol related problems. Score 20 or above:  warrants further diagnostic evaluation for alcohol dependence and treatment.   CLINICAL FACTORS:   Bipolar Disorder:   Mixed State   COGNITIVE FEATURES THAT CONTRIBUTE TO RISK:  Loss of executive function    SUICIDE RISK:   Minimal: No identifiable suicidal ideation.  Patients presenting with no risk factors but with morbid ruminations; may be classified as minimal risk based on the severity of the depressive symptoms  PLAN OF CARE: Admit for stabilization clearly needs long-acting injectable  I certify that inpatient services furnished can reasonably be expected to  improve the patient's condition.   Malvin Johns, MD 03/02/2019, 8:21 AM

## 2019-03-02 NOTE — Progress Notes (Addendum)
Pt visible in his room on initial approach. A & O X3. Speech is slow but logical. Pt's goal this shift is to "get my medicines right". Denies SI, HI, AVH and pain at this time. Presents suspicious and apprehensive on interactions. Cooperative with unit routines and care. Attended scheduled groups and was engaged. Pt showered and changed scrubs.  All medications administered as ordered with verbal education and effects monitored. Encouraged pt to voice concerns, comply with current treatment regimen including groups and to attend to ADLs. Q 15 minutes safety checks maintained without self harm gestures or outburst thus far. Pt remains safe on unit. Denies concerns at this time. POC continues for safety and mood stability.

## 2019-03-02 NOTE — Progress Notes (Signed)
Pt is asleep
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
## 2019-03-02 NOTE — Tx Team (Signed)
Interdisciplinary Treatment and Diagnostic Plan Update  03/02/2019 Time of Session: 0928 John [name] MRN: [id number]  Principal Diagnosis: <principal problem not specified>  Secondary Diagnoses: Active Problems:   Bipolar affective disorder ([place])   Current Medications:  Current Facility-Administered Medications  Medication Dose Route Frequency Provider Last Rate Last Dose  . benztropine (COGENTIN) tablet 0.5 mg  0.5 mg Oral BID Johnn Hai, MD   0.5 mg at 03/02/19 0956  . divalproex (DEPAKOTE ER) 24 hr tablet 500 mg  500 mg Oral BID Johnn Hai, MD   500 mg at 03/02/19 0956  . fluticasone (FLONASE) 50 MCG/ACT nasal spray 2 spray  2 spray Each Nare Daily Johnn Hai, MD      . OLANZapine zydis (ZYPREXA) disintegrating tablet 5 mg  5 mg Oral Q8H PRN Laverle Hobby, PA-C       And  . LORazepam (ATIVAN) tablet 1 mg  1 mg Oral PRN Patriciaann Clan E, PA-C       And  . ziprasidone (GEODON) injection 20 mg  20 mg Intramuscular PRN Laverle Hobby, PA-C      . nicotine polacrilex (NICORETTE) gum 2 mg  2 mg Oral PRN Johnn Hai, MD      . risperiDONE (RISPERDAL) tablet 3 mg  3 mg Oral BID Johnn Hai, MD   3 mg at 03/02/19 0956  . temazepam (RESTORIL) capsule 30 mg  30 mg Oral QHS Johnn Hai, MD       PTA Medications: Medications Prior to Admission  Medication Sig Dispense Refill Last Dose  . divalproex (DEPAKOTE) 500 MG DR tablet Take 1 tablet (500 mg total) by mouth every 12 (twelve) hours. (Patient not taking: Reported on 02/24/2019) 60 tablet 0 Not Taking at Unknown time  . Multiple Vitamin (MULTIVITAMIN WITH MINERALS) TABS Take 1 tablet by mouth daily. 30 tablet 0 Past Week at Unknown time  . Omega-3 Fatty Acids (FISH OIL) 500 MG CAPS Take 1,000 mg by mouth daily.   Past Week at Unknown time    Patient Stressors: Financial difficulties Health problems Medication change or noncompliance Substance abuse  Patient Strengths: Armed forces logistics/support/administrative officer Supportive  family/friends  Treatment Modalities: Medication Management, Group therapy, Case management,  1 to 1 session with clinician, Psychoeducation, Recreational therapy.   Physician Treatment Plan for Primary Diagnosis: <principal problem not specified> Long Term Goal(s): Improvement in symptoms so as ready for discharge Improvement in symptoms so as ready for discharge   Short Term Goals: Compliance with prescribed medications will improve Ability to demonstrate self-control will improve Ability to identify and develop effective coping behaviors will improve  Medication Management: Evaluate patient's response, side effects, and tolerance of medication regimen.  Therapeutic Interventions: 1 to 1 sessions, Unit Group sessions and Medication administration.  Evaluation of Outcomes: Not Met  Physician Treatment Plan for Secondary Diagnosis: Active Problems:   Bipolar affective disorder ([place])  Long Term Goal(s): Improvement in symptoms so as ready for discharge Improvement in symptoms so as ready for discharge   Short Term Goals: Compliance with prescribed medications will improve Ability to demonstrate self-control will improve Ability to identify and develop effective coping behaviors will improve     Medication Management: Evaluate patient's response, side effects, and tolerance of medication regimen.  Therapeutic Interventions: 1 to 1 sessions, Unit Group sessions and Medication administration.  Evaluation of Outcomes: Not Met   RN Treatment Plan for Primary Diagnosis: <principal problem not specified> Long Term Goal(s): Knowledge of disease and therapeutic regimen to maintain health will  improve  Short Term Goals: Ability to identify and develop effective coping behaviors will improve and Compliance with prescribed medications will improve  Medication Management: RN will administer medications as ordered by provider, will assess and evaluate patient's response and provide  education to patient for prescribed medication. RN will report any adverse and/or side effects to prescribing provider.  Therapeutic Interventions: 1 on 1 counseling sessions, Psychoeducation, Medication administration, Evaluate responses to treatment, Monitor vital signs and CBGs as ordered, Perform/monitor CIWA, COWS, AIMS and Fall Risk screenings as ordered, Perform wound care treatments as ordered.  Evaluation of Outcomes: Not Met   LCSW Treatment Plan for Primary Diagnosis: <principal problem not specified> Long Term Goal(s): Safe transition to appropriate next level of care at discharge, Engage patient in therapeutic group addressing interpersonal concerns.  Short Term Goals: Engage patient in aftercare planning with referrals and resources, Increase social support and Increase skills for wellness and recovery  Therapeutic Interventions: Assess for all discharge needs, 1 to 1 time with Social worker, Explore available resources and support systems, Assess for adequacy in community support network, Educate family and significant other(s) on suicide prevention, Complete Psychosocial Assessment, Interpersonal group therapy.  Evaluation of Outcomes: Not Met   Progress in Treatment: Attending groups: Yes. Participating in groups: Yes. Taking medication as prescribed: Yes. Toleration medication: Yes. Family/Significant other contact made: No, will contact:  brother Patient understands diagnosis: No. Discussing patient identified problems/goals with staff: Yes. Medical problems stabilized or resolved: Yes. Denies suicidal/homicidal ideation: Yes. Issues/concerns per patient self-inventory: No. Other: none  New problem(s) identified: No, Describe:  none  New Short Term/Long Term Goal(s):  Patient Goals:  "get meds straight"  Discharge Plan or Barriers:   Reason for Continuation of Hospitalization: Mania Medication stabilization  Estimated Length of Stay:3-5  days.  Attendees: Patient:John [name] 03/02/2019   Physician: Dr. Jake Samples, MD 03/02/2019   Nursing: Keane Police, RN 03/02/2019   RN Care Manager: 03/02/2019   Social Worker: Lurline Idol, LCSW 03/02/2019   Recreational Therapist:  03/02/2019   Other:  03/02/2019   Other:  03/02/2019   Other: 03/02/2019        Scribe for Treatment Team: Joanne Chars, [place] 03/02/2019 2:00 PM

## 2019-03-02 NOTE — Progress Notes (Signed)
[name] attended wrap-up group tonight. Pt appears flat/preocuupied/manic in affect and mood. Pt denies SI/HI/AVH/Pain at this time. Pt was apprehensive about taking medications. With much encouragement; Pt took scheduled meds. Pt states to Clinical research associate he thinks Restoril is too much for him; Pt appears to be fighting the effects of mediations. Pt constantly comes up to NS asking about side effects. Pt remains a HFR due to unsteady gait. Support provided. Will continue with POC.

## 2019-03-03 MED ORDER — ACETAMINOPHEN 325 MG PO TABS
650.0000 mg | ORAL_TABLET | Freq: Four times a day (QID) | ORAL | Status: DC | PRN
  Administered 2019-03-04 – 2019-03-05 (×3): 650 mg via ORAL
  Filled 2019-03-03 (×4): qty 2

## 2019-03-03 MED ORDER — METHOCARBAMOL 750 MG PO TABS
750.0000 mg | ORAL_TABLET | Freq: Four times a day (QID) | ORAL | Status: DC | PRN
  Administered 2019-03-03: 750 mg via ORAL
  Filled 2019-03-03: qty 1

## 2019-03-03 MED ORDER — BACITRACIN-NEOMYCIN-POLYMYXIN 400-5-5000 EX OINT
TOPICAL_OINTMENT | Freq: Two times a day (BID) | CUTANEOUS | Status: DC | PRN
  Administered 2019-03-03: 14:00:00 via TOPICAL

## 2019-03-03 MED ORDER — LORAZEPAM 1 MG PO TABS
ORAL_TABLET | ORAL | Status: AC
  Filled 2019-03-03: qty 1

## 2019-03-03 NOTE — Progress Notes (Signed)
Nursing Progress Note: 7p-7a D: Pt currently presents with a anxious/disorganized affect and behavior. Pt states "I had a better day today." Interacting appropriately with the milieu. Pt reports good sleep during the previous night with current medication regimen. Pt did attend wrap-up group.  A: Pt provided with medications per providers orders. Pt's labs and vitals were monitored throughout the night. Pt supported emotionally and encouraged to express concerns and questions. Pt educated on medications.  R: Pt's safety ensured with 15 minute and environmental checks. Pt currently denies SI, HI, and AVH. Pt verbally contracts to seek staff if SI,HI, or AVH occurs and to consult with staff before acting on any harmful thoughts. Will continue to monitor.

## 2019-03-03 NOTE — Progress Notes (Signed)
John [name] remains manic/bizarre this a.m. Pt frequently comes up to NS asking for random things. Pt observe in room; wiping the floor with a towel. Pt states he needs to mop his bathroom floor.

## 2019-03-03 NOTE — Progress Notes (Signed)
Patient ID: John [name], male   DOB: 02/20/1986, 33 y.o.   MRN: [id number]    D: Pt started off the morning very agitated and irritable. Pt reported that it was just too much going on and that he needed something. Pt was given Geodon and Ativan, per orders from [place] NP. Pt responded well to the medication, it helped with his agitation. Pt attended all groups and engaged in treatment. Tanika NP agreed to allow the patient to go to the dining area for dinner and that if he maintained his behavior he could come off the unit restriction. Pt did well in the dining area per MHT, unit restriction will be discontinued.  Pt reported being negative SI/HI, no AH/VH noted. A: 15 min checks continued for patient safety. R: Pt safety maintained.

## 2019-03-03 NOTE — Progress Notes (Addendum)
[facility] MD Progress Note  03/03/2019 2:57 PM John [name]  MRN:  [id number]   Subjective:  John [name] observed standing at the nurses station awaiting medication administration.  He is awake alert and oriented x3.  As reported patient became irritable with peer due to intrusiveness.  Patient reports feeling better overall however states he was admitted "because I needed to work on myself".  Denied that he was followed by psychiatry or therapist prior to his admission.  Denies that he was taking medication consistently.  Currently denying suicidal or homicidal ideations.  Denies auditory visual hallucinations.  Patient appears to be guarded, flat and depressed.  Reports he did not sleep well last night however states he normally sleeps well when taking Depakote 250 at night.  Reports a good appetite.  We will continue to monitor for safety.  Patient was administered agitation protocol see chart for orders. Will attempt to lift unit restriction at supper. Support encouragement reassurance was provided  History: Per admission assessment note:this is the latest of multiple admissions and encounters in this healthcare system for John [name], 33 year old single patient is known to have a bipolar condition complicated by chronic cannabis dependency. He presented last week through the emergency department on 3/14 with disorganized and even dangerous behaviors but was stabilized very well there and attained a baseline status and was released on 3/15, however he did not comply with outpatient recommendations or medications and re-presented on 3/18 once again with manic symptoms.  He was described as tearing things up in the home by his roommate, running into traffic, and was consistently disorganized, requiring near constant redirection the emergency department and even threatening at times. He had received IM Geodon sublingual Zyprexa and IM Ativan in the emergency department.   Principal Problem: Bipolar affective  disorder (HCC) Diagnosis: Principal Problem:   Bipolar affective disorder (H[facility])  Total Time spent with patient: 15 minutes  Past Psychiatric History:   Past Medical History:  Past Medical History:  Diagnosis Date  . Bipolar 1 disorder (HCC)   . Mental disorder     Past Surgical History:  Procedure Laterality Date  . WISDOM TOOTH EXTRACTION     Family History: History reviewed. No pertinent family history. Family Psychiatric  History:  Social History:  Social History   Substance and Sexual Activity  Alcohol Use Yes     Social History   Substance and Sexual Activity  Drug Use Yes  . Types: Marijuana    Social History   Socioeconomic History  . Marital status: Single    Spouse name: Not on file  . Number of children: Not on file  . Years of education: Not on file  . Highest education level: Not on file  Occupational History  . Not on file  Social Needs  . Financial resource strain: Not on file  . Food insecurity:    Worry: Not on file    Inability: Not on file  . Transportation needs:    Medical: Not on file    Non-medical: Not on file  Tobacco Use  . Smoking status: Current Every Day Smoker    Types: Cigarettes  . Smokeless tobacco: Never Used  Substance and Sexual Activity  . Alcohol use: Yes  . Drug use: Yes    Types: Marijuana  . Sexual activity: Not on file  Lifestyle  . Physical activity:    Days per week: Not on file    Minutes per session: Not on file  . Stress: Not on file  Relationships  . Social connections:    Talks on phone: Not on file    Gets together: Not on file    Attends religious service: Not on file    Active member of club or organization: Not on file    Attends meetings of clubs or organizations: Not on file    Relationship status: Not on file  Other Topics Concern  . Not on file  Social History Narrative  . Not on file   Additional Social History:                         Sleep: Fair  Appetite:   Fair  Current Medications: Current Facility-Administered Medications  Medication Dose Route Frequency Provider Last Rate Last Dose  . benztropine (COGENTIN) tablet 0.5 mg  0.5 mg Oral BID Malvin Johns, MD   0.5 mg at 03/03/19 7858  . divalproex (DEPAKOTE ER) 24 hr tablet 500 mg  500 mg Oral BID Malvin Johns, MD   500 mg at 03/03/19 8502  . fluticasone (FLONASE) 50 MCG/ACT nasal spray 2 spray  2 spray Each Nare Daily Malvin Johns, MD   2 spray at 03/03/19 [phone]  . neomycin-bacitracin-polymyxin (NEOSPORIN) ointment packet   Topical BID PRN Oneta Rack, NP      . nicotine polacrilex (NICORETTE) gum 2 mg  2 mg Oral PRN Malvin Johns, MD      . OLANZapine zydis (ZYPREXA) disintegrating tablet 5 mg  5 mg Oral Q8H PRN Kerry Hough, PA-C      . risperiDONE (RISPERDAL) tablet 3 mg  3 mg Oral BID Malvin Johns, MD   3 mg at 03/03/19 2878  . temazepam (RESTORIL) capsule 30 mg  30 mg Oral QHS Malvin Johns, MD   30 mg at 03/02/19 2042    Lab Results: No results found for this or any previous visit (from the past 48 hour(s)).  Blood Alcohol level:  Lab Results  Component Value Date   ETH <10 02/28/2019   ETH <10 02/24/2019    Metabolic Disorder Labs: No results found for: HGBA1C, MPG No results found for: PROLACTIN Lab Results  Component Value Date   CHOL 134 07/26/2013   TRIG 41 07/26/2013   HDL 51 07/26/2013   CHOLHDL 2.6 07/26/2013   VLDL 8 07/26/2013   LDLCALC 75 07/26/2013    Physical Findings: AIMS: Facial and Oral Movements Muscles of Facial Expression: None, normal Lips and Perioral Area: None, normal Jaw: None, normal Tongue: None, normal,Extremity Movements Upper (arms, wrists, hands, fingers): None, normal Lower (legs, knees, ankles, toes): None, normal, Trunk Movements Neck, shoulders, hips: None, normal, Overall Severity Severity of abnormal movements (highest score from questions above): None, normal Incapacitation due to abnormal movements: None, normal Patient's  awareness of abnormal movements (rate only patient's report): No Awareness, Dental Status Current problems with teeth and/or dentures?: No Does patient usually wear dentures?: No  CIWA:    COWS:     Musculoskeletal: Strength & Muscle Tone: within normal limits Gait & Station: normal Patient leans: N/A  Psychiatric Specialty Exam: Physical Exam  Review of Systems  Musculoskeletal:       Abrasion to left hand after anger outburst on yesterday-Neosporin ordered  Psychiatric/Behavioral: Positive for depression. Negative for hallucinations. The patient is nervous/anxious.   All other systems reviewed and are negative.   Blood pressure (!) 147/79, pulse (!) 105, temperature (!) 97.4 F (36.3 C), temperature source Oral, resp. rate 18, height 6\' 3"  (  1.905 m), weight 95.2 kg.Body mass index is 26.23 kg/m.  General Appearance: Casual and Guarded  Eye Contact:  Fair  Speech:  Clear and Coherent  Volume:  Decreased  Mood:  Anxious  Affect:  Congruent  Thought Process:  Coherent  Orientation:  Full (Time, Place, and Person)  Thought Content:  Rumination  Suicidal Thoughts:  No  Homicidal Thoughts:  No  Memory:  Immediate;   Fair Recent;   Fair Remote;   Fair  Judgement:  Fair  Insight:  Fair  Psychomotor Activity:  Normal  Concentration:  Concentration: Poor  Recall:  [employer] of Knowledge:  Fair  Language:  Fair  Akathisia:  No  Handed:  Right  AIMS (if indicated):     Assets:  Communication Skills Desire for Improvement Resilience Social Support  ADL's:  Intact  Cognition:  WNL  Sleep:  Number of Hours: 5     Treatment Plan Summary: Daily contact with patient to assess and evaluate symptoms and progress in treatment and Medication management   Continue with current treatment plan on 03/03/2019 as listed below except were noted.  Bipolar affective disorder:  Continue Depakote 500 mg p.o. twice daily Continue Cogentin 0.5 mg p.o. twice daily Continue Resporal 3  mg p.o. nightly Continue Restoril 30 mg p.o. nightly  See chart for agitation protocol  CSW to continue working on discharge this with patient Patient encouraged to participate within the therapeutic milieu    Oneta Rack, NP 03/03/2019, 2:57 PM  Agree with NP Note

## 2019-03-04 LAB — COMPREHENSIVE METABOLIC PANEL
ALK PHOS: 74 U/L (ref 38–126)
ALT: 34 U/L (ref 0–44)
AST: 32 U/L (ref 15–41)
Albumin: 4.4 g/dL (ref 3.5–5.0)
Anion gap: 9 (ref 5–15)
BUN: 14 mg/dL (ref 6–20)
CO2: 29 mmol/L (ref 22–32)
Calcium: 9.5 mg/dL (ref 8.9–10.3)
Chloride: 100 mmol/L (ref 98–111)
Creatinine, Ser: 0.99 mg/dL (ref 0.61–1.24)
GFR calc Af Amer: >60 mL/min (ref >60)
GFR calc non Af Amer: >60 mL/min (ref >60)
GLUCOSE: 90 mg/dL (ref 70–99)
Potassium: 4.5 mmol/L (ref 3.5–5.1)
Sodium: 138 mmol/L (ref 135–145)
Total Bilirubin: 0.2 mg/dL — ABNORMAL LOW (ref 0.3–1.2)
Total Protein: 7.4 g/dL (ref 6.5–8.1)

## 2019-03-04 MED ORDER — AMLODIPINE BESYLATE 5 MG PO TABS
5.0000 mg | ORAL_TABLET | Freq: Every day | ORAL | Status: DC
  Administered 2019-03-04 – 2019-03-05 (×2): 5 mg via ORAL
  Filled 2019-03-04 (×4): qty 1

## 2019-03-04 MED ORDER — LORATADINE 10 MG PO TABS
10.0000 mg | ORAL_TABLET | Freq: Every day | ORAL | Status: DC
  Administered 2019-03-04 – 2019-03-06 (×3): 10 mg via ORAL
  Filled 2019-03-04 (×5): qty 1

## 2019-03-04 NOTE — BHH Group Notes (Signed)
[facility] LCSW Group Therapy Note  Date/Time:  03/04/2019  11:00AM-12:00PM  Type of Therapy and Topic:  Group Therapy:  Music and Mood  Participation Level:  Active   Description of Group: In this process group, members listened to a variety of genres of music and identified that different types of music evoke different responses.  Patients were encouraged to identify music that was soothing for them and music that was energizing for them.  Patients discussed how this knowledge can help with wellness and recovery in various ways including managing depression and anxiety as well as encouraging healthy sleep habits.    Therapeutic Goals: 1. Patients will explore the impact of different varieties of music on mood 2. Patients will verbalize the thoughts they have when listening to different types of music 3. Patients will identify music that is soothing to them as well as music that is energizing to them 4. Patients will discuss how to use this knowledge to assist in maintaining wellness and recovery 5. Patients will explore the use of music as a coping skill  Summary of Patient Progress:  Patient was an active participant in group. He commented on how the music influenced his mood and emotional state. He expressed that he recognizes that music is an effective coping strategy that he will continue use it to maintain wellness and recovery.  Therapeutic Modalities: Solution Focused Brief Therapy Activity   Henrene Dodge, LCSW

## 2019-03-04 NOTE — Progress Notes (Signed)
Patient ID: John [name], male   DOB: 1986-01-23, 33 y.o.   MRN: [id number]    D: Pt has been anxious today on the unit, he reported be anxious because his blood pressure was high. Pt reported that he was working through all the things he was told since being admitted. Pt attended all groups and engaged in treatment. Pt reported that his depression was a 1, his hopelessness was a 0, and his anxiety was a 2. Pt reported that his goal for today was to relax, chill, and help himself. Pt reported being negative SI/HI, no AH/VH noted. A: 15 min checks continued for patient safety. R: Pt safety maintained.

## 2019-03-04 NOTE — Progress Notes (Signed)
Nursing Progress Note: 7p-7a D: Pt currently presents with a anxious/circumstantial/animated affect and behavior. Pt states Interacting appropriately with the milieu. Pt reports good sleep during the previous night with current medication regimen. Pt did attend wrap-up group.  A: Pt provided with medications per providers orders. Pt's labs and vitals were monitored throughout the night. Pt supported emotionally and encouraged to express concerns and questions. Pt educated on medications.  R: Pt's safety ensured with 15 minute and environmental checks. Pt currently denies SI, HI, and AVH. Pt verbally contracts to seek staff if SI,HI, or AVH occurs and to consult with staff before acting on any harmful thoughts. Will continue to monitor.

## 2019-03-04 NOTE — Progress Notes (Addendum)
[facility] MD Progress Note  03/04/2019 10:25 AM John [name]  MRN:  [id number]   Subjective:  John [name] observed standing at the nurses station.  He is awake, alert and oriented x3.  Reports feeling better overall.  Discontinue unit restriction as patient's behavior has improved.  He denies suicidal or homicidal ideations.  Denies auditory or visual hallucinations.  Patient was initiated on amlodipine for elevated blood pressure. Repeat CMP- slight elevation with creatinine 1.31 on 3/18.  Staff to continue to monitor.  He denies headache nausea,vomiting,shortness of breath or dizziness.  Denies a history of hypertension.  Patient reports taking medications as directed.  Denies medication side effects.  Support encouragement reassurance was provided.   History: Per admission assessment note:this is the latest of multiple admissions and encounters in this healthcare system for John [name], 33 year old single patient is known to have a bipolar condition complicated by chronic cannabis dependency. He presented last week through the emergency department on 3/14 with disorganized and even dangerous behaviors but was stabilized very well there and attained a baseline status and was released on 3/15, however he did not comply with outpatient recommendations or medications and re-presented on 3/18 once again with manic symptoms.  He was described as tearing things up in the home by his roommate, running into traffic, and was consistently disorganized, requiring near constant redirection the emergency department and even threatening at times. He had received IM Geodon sublingual Zyprexa and IM Ativan in the emergency department.   Principal Problem: Bipolar affective disorder (HCC) Diagnosis: Principal Problem:   Bipolar affective disorder (HCC)  Total Time spent with patient: 15 minutes  Past Psychiatric History:   Past Medical History:  Past Medical History:  Diagnosis Date  . Bipolar 1 disorder (HCC)   .  Mental disorder     Past Surgical History:  Procedure Laterality Date  . WISDOM TOOTH EXTRACTION     Family History: History reviewed. No pertinent family history. Family Psychiatric  History:  Social History:  Social History   Substance and Sexual Activity  Alcohol Use Yes     Social History   Substance and Sexual Activity  Drug Use Yes  . Types: Marijuana    Social History   Socioeconomic History  . Marital status: Single    Spouse name: Not on file  . Number of children: Not on file  . Years of education: Not on file  . Highest education level: Not on file  Occupational History  . Not on file  Social Needs  . Financial resource strain: Not on file  . Food insecurity:    Worry: Not on file    Inability: Not on file  . Transportation needs:    Medical: Not on file    Non-medical: Not on file  Tobacco Use  . Smoking status: Current Every Day Smoker    Types: Cigarettes  . Smokeless tobacco: Never Used  Substance and Sexual Activity  . Alcohol use: Yes  . Drug use: Yes    Types: Marijuana  . Sexual activity: Not on file  Lifestyle  . Physical activity:    Days per week: Not on file    Minutes per session: Not on file  . Stress: Not on file  Relationships  . Social connections:    Talks on phone: Not on file    Gets together: Not on file    Attends religious service: Not on file    Active member of club or organization: Not on file  Attends meetings of clubs or organizations: Not on file    Relationship status: Not on file  Other Topics Concern  . Not on file  Social History Narrative  . Not on file   Additional Social History:                         Sleep: Fair  Appetite:  Fair  Current Medications: Current Facility-Administered Medications  Medication Dose Route Frequency Provider Last Rate Last Dose  . acetaminophen (TYLENOL) tablet 650 mg  650 mg Oral Q6H PRN Nira Conn A, NP      . amLODipine (NORVASC) tablet 5 mg  5 mg  Oral Daily Oneta Rack, NP   5 mg at 03/04/19 0943  . benztropine (COGENTIN) tablet 0.5 mg  0.5 mg Oral BID Malvin Johns, MD   0.5 mg at 03/04/19 7846  . divalproex (DEPAKOTE ER) 24 hr tablet 500 mg  500 mg Oral BID Malvin Johns, MD   500 mg at 03/04/19 9629  . fluticasone (FLONASE) 50 MCG/ACT nasal spray 2 spray  2 spray Each Nare Daily Malvin Johns, MD   2 spray at 03/04/19 [phone]  . methocarbamol (ROBAXIN) tablet 750 mg  750 mg Oral Q6H PRN Nira Conn A, NP   750 mg at 03/03/19 2028  . neomycin-bacitracin-polymyxin (NEOSPORIN) ointment packet   Topical BID PRN Oneta Rack, NP      . nicotine polacrilex (NICORETTE) gum 2 mg  2 mg Oral PRN Malvin Johns, MD      . OLANZapine zydis (ZYPREXA) disintegrating tablet 5 mg  5 mg Oral Q8H PRN Kerry Hough, PA-C      . risperiDONE (RISPERDAL) tablet 3 mg  3 mg Oral BID Malvin Johns, MD   3 mg at 03/04/19 1324  . temazepam (RESTORIL) capsule 30 mg  30 mg Oral QHS Malvin Johns, MD   30 mg at 03/03/19 2028    Lab Results: No results found for this or any previous visit (from the past 48 hour(s)).  Blood Alcohol level:  Lab Results  Component Value Date   ETH <10 02/28/2019   ETH <10 02/24/2019    Metabolic Disorder Labs: No results found for: HGBA1C, MPG No results found for: PROLACTIN Lab Results  Component Value Date   CHOL 134 07/26/2013   TRIG 41 07/26/2013   HDL 51 07/26/2013   CHOLHDL 2.6 07/26/2013   VLDL 8 07/26/2013   LDLCALC 75 07/26/2013    Physical Findings: AIMS: Facial and Oral Movements Muscles of Facial Expression: None, normal Lips and Perioral Area: None, normal Jaw: None, normal Tongue: None, normal,Extremity Movements Upper (arms, wrists, hands, fingers): None, normal Lower (legs, knees, ankles, toes): None, normal, Trunk Movements Neck, shoulders, hips: None, normal, Overall Severity Severity of abnormal movements (highest score from questions above): None, normal Incapacitation due to abnormal  movements: None, normal Patient's awareness of abnormal movements (rate only patient's report): No Awareness, Dental Status Current problems with teeth and/or dentures?: No Does patient usually wear dentures?: No  CIWA:    COWS:     Musculoskeletal: Strength & Muscle Tone: within normal limits Gait & Station: normal Patient leans: N/A  Psychiatric Specialty Exam: Physical Exam  Review of Systems  Musculoskeletal:       Abrasion to left hand after anger outburst on yesterday-Neosporin ordered  Psychiatric/Behavioral: Positive for depression. Negative for hallucinations. The patient is nervous/anxious.   All other systems reviewed and are negative.  Blood pressure (!) 147/89, pulse 100, temperature 98 F (36.7 C), temperature source Oral, resp. rate 18, height 6\' 3"  (1.905 m), weight 95.2 kg.Body mass index is 26.23 kg/m.  General Appearance: Casual and Guarded  Eye Contact:  Fair  Speech:  Clear and Coherent  Volume:  Decreased  Mood:  Anxious  Affect:  Congruent  Thought Process:  Coherent  Orientation:  Full (Time, Place, and Person)  Thought Content:  Rumination  Suicidal Thoughts:  No  Homicidal Thoughts:  No  Memory:  Immediate;   Fair Recent;   Fair Remote;   Fair  Judgement:  Fair  Insight:  Fair  Psychomotor Activity:  Normal  Concentration:  Concentration: Poor  Recall:  [employer] of Knowledge:  Fair  Language:  Fair  Akathisia:  No  Handed:  Right  AIMS (if indicated):     Assets:  Communication Skills Desire for Improvement Resilience Social Support  ADL's:  Intact  Cognition:  WNL  Sleep:  Number of Hours: 4.75     Treatment Plan Summary: Daily contact with patient to assess and evaluate symptoms and progress in treatment and Medication management   Continue with current treatment plan on 03/04/2019 as listed below except were noted.  Bipolar affective disorder:  Continue Depakote 500 mg p.o. twice daily Continue Cogentin 0.5 mg p.o. twice  daily Continue Resporal 3 mg p.o. nightly Continue Restoril 30 mg p.o. nightly   Orders placed for CMP- initiated Amlodipine 5 mg daily for HTN  See chart for agitation protocol  CSW to continue working on discharge this with patient Patient encouraged to participate within the therapeutic milieu    Oneta Rack, NP 03/04/2019, 10:25 AM Agree with NP note

## 2019-03-05 MED ORDER — AMLODIPINE BESYLATE 10 MG PO TABS
10.0000 mg | ORAL_TABLET | Freq: Every day | ORAL | Status: DC
  Administered 2019-03-06: 10 mg via ORAL
  Filled 2019-03-05 (×2): qty 1

## 2019-03-05 MED ORDER — DIVALPROEX SODIUM ER 500 MG PO TB24
1000.0000 mg | ORAL_TABLET | Freq: Every day | ORAL | Status: DC
  Filled 2019-03-05: qty 2

## 2019-03-05 NOTE — Plan of Care (Signed)
Progress note  D: pt found in bed; compliant with medication administration. Pt denies any physical symptoms, but does complain of neck pain that he rates at a 3/10. Pt states he slept well. Pt rates his depression/hopelessness/anxiety a 0/0/0 out of 10 respectively. Pt states his goal for today is to make a routine for today and he will achieve this by cleaning up his routine. Pt denies si/hi/ah/vh and verbally agrees to approach staff if these become apparent or before harming himself/others while at [facility].  A: pt provided support and encouragement. Pt given medication per protocol and standing orders. Q66m safety checks implemented and continued.  R: pt safe on the unit. Will continue to monitor.   Pt progressing in the following metrics  Problem: Activity: Goal: Will verbalize the importance of balancing activity with adequate rest periods Outcome: Progressing   Problem: Education: Goal: Will be free of psychotic symptoms Outcome: Progressing Goal: Knowledge of the prescribed therapeutic regimen will improve Outcome: Progressing   Problem: Coping: Goal: Coping ability will improve Outcome: Progressing

## 2019-03-05 NOTE — [facility] Suicide Risk Assessment (Signed)
[facility] INPATIENT:  Family/Significant Other Suicide Prevention Education  Suicide Prevention Education:  Contact Attempts: Charleston Ropes, girfriend, [phone], has been identified by the patient as the family member/significant other with whom the patient will be residing, and identified as the person(s) who will aid the patient in the event of a mental health crisis.  With written consent from the patient, two attempts were made to provide suicide prevention education, prior to and/or following the patient's discharge.  We were unsuccessful in providing suicide prevention education.  A suicide education pamphlet was given to the patient to share with family/significant other.  Date and time of first attempt:03/05/19, 53. Number not valid. Date and time of second attempt:  Lorri Frederick, LCSW 03/05/2019, 1:33 PM

## 2019-03-05 NOTE — [facility] Suicide Risk Assessment (Addendum)
[facility] INPATIENT:  Family/Significant Other Suicide Prevention Education  Suicide Prevention Education:  Contact Attempts: Marcha Solders, brother, [phone], has been identified by the patient as the family member/significant other with whom the patient will be residing, and identified as the person(s) who will aid the patient in the event of a mental health crisis.  With written consent from the patient, two attempts were made to provide suicide prevention education, prior to and/or following the patient's discharge.  We were unsuccessful in providing suicide prevention education.  A suicide education pamphlet was given to the patient to share with family/significant other.  Date and time of first attempt: 03/05/19, 1332 Date and time of second attempt: 03/06/19, 0946 at [phone]  Lorri Frederick, LCSW 03/05/2019, 1:32 PM

## 2019-03-05 NOTE — Progress Notes (Signed)
Recreation Therapy Notes  Date: 3.23.20 Time: 1000 Location: 500 Hall Dayroom  Group Topic: Coping Skills  Goal Area(s) Addresses:  Patient will identify positive and negative coping skills. Patient will identify benefits of positive coping skills. Patient will identify benefit of using coping skills post d/c.  Behavioral Response: Engaged  Intervention: Worksheet, pencils  Activity: Mind Map.  Patients and LRT filled out the first 8 (anger, love, loneliness, forgiveness, frustration, anxiety, not being validated and racism/discrimination) boxes with triggers/situations that would require coping skills.  Patients were to then identify negative coping skills for each trigger and contrast that to the positive coping skills they identify.  Education: Pharmacologist, Building control surveyor.   Education Outcome: Acknowledges understanding/In group clarification offered/Needs additional education.   Clinical Observations/Feedback: Pt expressed coping skills are used to help deal with situations.  Pt also expressed the situation depends on the coping skills used by the individual but conceded negative coping skills were used most often.  Pt identified negative coping skills as rushing and anger.  Pt identified his positive coping skills as take a time out, self love, family/friends, self worth, self respect, self evaluation, acceptance, open minded and culture.    Caroll Rancher, LRT/CTRS     Caroll Rancher A 03/05/2019 11:41 AM

## 2019-03-05 NOTE — Progress Notes (Signed)
[facility] MD Progress Note  03/05/2019 11:38 AM John [name]  MRN:  [id number] Subjective:   Patient is certainly more coherent and organized he denies wanting to harm self or others he is requesting specifically the sedating medications be given predominantly at bedtime we will accommodate that and adjust his Depakote to all at bedtime dosing.  Denies hallucinations may be close to baseline no thoughts of harming self no EPS or TD  Principal Problem: Bipolar affective disorder (HCC) Diagnosis: Principal Problem:   Bipolar affective disorder (HCC)  Total Time spent with patient: 20 minutes Past Medical History:  Past Medical History:  Diagnosis Date  . Bipolar 1 disorder (HCC)   . Mental disorder     Past Surgical History:  Procedure Laterality Date  . WISDOM TOOTH EXTRACTION     Family History: History reviewed. No pertinent family history. Family Psychiatric  History: neg Social History:  Social History   Substance and Sexual Activity  Alcohol Use Yes     Social History   Substance and Sexual Activity  Drug Use Yes  . Types: Marijuana    Social History   Socioeconomic History  . Marital status: Single    Spouse name: Not on file  . Number of children: Not on file  . Years of education: Not on file  . Highest education level: Not on file  Occupational History  . Not on file  Social Needs  . Financial resource strain: Not on file  . Food insecurity:    Worry: Not on file    Inability: Not on file  . Transportation needs:    Medical: Not on file    Non-medical: Not on file  Tobacco Use  . Smoking status: Current Every Day Smoker    Types: Cigarettes  . Smokeless tobacco: Never Used  Substance and Sexual Activity  . Alcohol use: Yes  . Drug use: Yes    Types: Marijuana  . Sexual activity: Not on file  Lifestyle  . Physical activity:    Days per week: Not on file    Minutes per session: Not on file  . Stress: Not on file  Relationships  . Social  connections:    Talks on phone: Not on file    Gets together: Not on file    Attends religious service: Not on file    Active member of club or organization: Not on file    Attends meetings of clubs or organizations: Not on file    Relationship status: Not on file  Other Topics Concern  . Not on file  Social History Narrative  . Not on file   Additional Social History:                         Sleep: Good  Appetite:  Good  Current Medications: Current Facility-Administered Medications  Medication Dose Route Frequency Provider Last Rate Last Dose  . acetaminophen (TYLENOL) tablet 650 mg  650 mg Oral Q6H PRN Nira Conn A, NP   650 mg at 03/05/19 0900  . [START ON 03/06/2019] amLODipine (NORVASC) tablet 10 mg  10 mg Oral Daily Malvin Johns, MD      . benztropine (COGENTIN) tablet 0.5 mg  0.5 mg Oral BID Malvin Johns, MD   0.5 mg at 03/05/19 0743  . [START ON 03/06/2019] divalproex (DEPAKOTE ER) 24 hr tablet 1,000 mg  1,000 mg Oral QHS Malvin Johns, MD      . fluticasone [facility]) 50 MCG/ACT  nasal spray 2 spray  2 spray Each Nare Daily Malvin Johns, MD   2 spray at 03/05/19 [phone]  . loratadine (CLARITIN) tablet 10 mg  10 mg Oral Daily Oneta Rack, NP   10 mg at 03/05/19 0743  . methocarbamol (ROBAXIN) tablet 750 mg  750 mg Oral Q6H PRN Nira Conn A, NP   750 mg at 03/03/19 2028  . neomycin-bacitracin-polymyxin (NEOSPORIN) ointment packet   Topical BID PRN Oneta Rack, NP      . nicotine polacrilex (NICORETTE) gum 2 mg  2 mg Oral PRN Malvin Johns, MD      . OLANZapine zydis (ZYPREXA) disintegrating tablet 5 mg  5 mg Oral Q8H PRN Kerry Hough, PA-C      . risperiDONE (RISPERDAL) tablet 3 mg  3 mg Oral BID Malvin Johns, MD   3 mg at 03/05/19 0744  . temazepam (RESTORIL) capsule 30 mg  30 mg Oral QHS Malvin Johns, MD   30 mg at 03/04/19 2109    Lab Results:  Results for orders placed or performed during the hospital encounter of 03/01/19 (from the past 48 hour(s))   Comprehensive metabolic panel     Status: Abnormal   Collection Time: 03/04/19  7:00 PM  Result Value Ref Range   Sodium 138 135 - 145 mmol/L   Potassium 4.5 3.5 - 5.1 mmol/L   Chloride 100 98 - 111 mmol/L   CO2 29 22 - 32 mmol/L   Glucose, Bld 90 70 - 99 mg/dL   BUN 14 6 - 20 mg/dL   Creatinine, Ser 6.86 0.61 - 1.24 mg/dL   Calcium 9.5 8.9 - 16.8 mg/dL   Total Protein 7.4 6.5 - 8.1 g/dL   Albumin 4.4 3.5 - 5.0 g/dL   AST 32 15 - 41 U/L   ALT 34 0 - 44 U/L   Alkaline Phosphatase 74 38 - 126 U/L   Total Bilirubin 0.2 (L) 0.3 - 1.2 mg/dL   GFR calc non Af Amer >60 >60 mL/min   GFR calc Af Amer >60 >60 mL/min   Anion gap 9 5 - 15    Comment: Performed at [facility], [address], [place], [place] [address]    Blood Alcohol level:  Lab Results  Component Value Date   ETH <10 02/28/2019   ETH <10 02/24/2019    Metabolic Disorder Labs: No results found for: HGBA1C, MPG No results found for: PROLACTIN Lab Results  Component Value Date   CHOL 134 07/26/2013   TRIG 41 07/26/2013   HDL 51 07/26/2013   CHOLHDL 2.6 07/26/2013   VLDL 8 07/26/2013   LDLCALC 75 07/26/2013    Physical Findings: AIMS: Facial and Oral Movements Muscles of Facial Expression: None, normal Lips and Perioral Area: None, normal Jaw: None, normal Tongue: None, normal,Extremity Movements Upper (arms, wrists, hands, fingers): None, normal Lower (legs, knees, ankles, toes): None, normal, Trunk Movements Neck, shoulders, hips: None, normal, Overall Severity Severity of abnormal movements (highest score from questions above): None, normal Incapacitation due to abnormal movements: None, normal Patient's awareness of abnormal movements (rate only patient's report): No Awareness, Dental Status Current problems with teeth and/or dentures?: No Does patient usually wear dentures?: No  CIWA:    COWS:     Musculoskeletal: Strength & Muscle Tone: within normal limits Gait & Station:  normal Patient leans: N/A  Psychiatric Specialty Exam: Physical Exam  ROS  Blood pressure (!) 138/91, pulse 84, temperature 97.7 F (36.5 C),  temperature source Oral, resp. rate 18, height 6\' 3"  (1.905 m), weight 95.2 kg.Body mass index is 26.23 kg/m.  General Appearance: Casual  Eye Contact:  Good  Speech:  Clear and Coherent  Volume:  Normal  Mood:  Euthymic  Affect:  Blunt  Thought Process:  Linear  Orientation:  Full (Time, Place, and Person)  Thought Content:  Tangential  Suicidal Thoughts:  No  Homicidal Thoughts:  No  Memory:  Immediate;   Fair  Judgement:  Good  Insight:  Good  Psychomotor Activity:  Normal  Concentration:  Concentration: Good  Recall:  Good  Fund of Knowledge:  Good  Language:  Good  Akathisia:  Negative  Handed:  Right  AIMS (if indicated):     Assets:  Desire for Improvement  ADL's:  Intact  Cognition:  WNL  Sleep:  Number of Hours: 4     Treatment Plan Summary: Daily contact with patient to assess and evaluate symptoms and progress in treatment, Medication management and Plan No change in meds today continue cognitive and rehab based therapies may go in the morning  Janette Harvie, MD 03/05/2019, 11:38 AM

## 2019-03-06 MED ORDER — RISPERIDONE 3 MG PO TABS
6.0000 mg | ORAL_TABLET | Freq: Every day | ORAL | Status: DC
  Filled 2019-03-06: qty 28

## 2019-03-06 MED ORDER — FLUTICASONE PROPIONATE 50 MCG/ACT NA SUSP: 2.0000 | Freq: Every day | NASAL | 2 refills | Status: DC

## 2019-03-06 MED ORDER — DIVALPROEX SODIUM ER 500 MG PO TB24: 1000.0000 mg | ORAL_TABLET | Freq: Every day | ORAL | 2 refills | Status: DC

## 2019-03-06 MED ORDER — AMLODIPINE BESYLATE 10 MG PO TABS: 10.0000 mg | ORAL_TABLET | Freq: Every day | ORAL | 1 refills | Status: DC

## 2019-03-06 MED ORDER — TEMAZEPAM 30 MG PO CAPS: 30.0000 mg | ORAL_CAPSULE | Freq: Every day | ORAL | 0 refills | Status: DC

## 2019-03-06 MED ORDER — BENZTROPINE MESYLATE 0.5 MG PO TABS: 0.5000 mg | ORAL_TABLET | Freq: Two times a day (BID) | ORAL | 2 refills | Status: DC

## 2019-03-06 MED ORDER — RISPERIDONE 3 MG PO TABS: 6.0000 mg | ORAL_TABLET | Freq: Every day | ORAL | 2 refills | Status: DC

## 2019-03-06 NOTE — Progress Notes (Addendum)
D:  John [name] was up and visible on the unit.  Minimal to no interaction with peers.  He attended evening wrap up group.  He reported having a good day today.  He was noted carrying around a copy of his medication list and complained of allergies requesting his nasal spray.  It was explained to him that he can only take that once per day, he was confused thinking that 2 sprays meant that he could take it twice per day.  He was given Tylenol with good relief of neck pain and ice pack was given at bedtime.  He denied SI/HI or A/V hallucinations.  He was intrusive and demanding at times.  He continues to be bizarre with his interactions.  He took his hs sleeping medication without difficulty.  He is currently resting with his eyes closed and appears to be asleep. A:  1:1 with RN for support and encouragement.  Medications as ordered.  Q 15 minute checks maintained for safety.  Encouraged participation in group and unit activities.   R:  John [name] remains safe on the unit.  We will continue to monitor the progress towards his goals.

## 2019-03-06 NOTE — Progress Notes (Signed)
Recreation Therapy Notes  Date: 3.24.20 Time: 1000 Location: 500 Hall Dayroom  Group Topic: Time Management, Team Building, Problem Solving  Goal Area(s) Addresses:  Patient will effectively work with peer towards shared goal.  Patient will identify skill used to make activity successful.  Patient will identify how skills used during activity can be used to reach post d/c goals.   Behavioral Response: Engaged  Intervention: STEM Activity   Activity: In team's, using 20 plastic straws and masking tape, patients were to build a free standing bridge that would hold a small paperback book.  Education: Pharmacist, community, Building control surveyor.   Education Outcome: Acknowledges education/In group clarification offered/Needs additional education.   Clinical Observations/Feedback:  Pt active and attentive to the activity during group session.  Pt gave and received suggestions from peers.  Pt expressed the group made good use of their time.  Pt explained that time management was important because "it helps you get the important things done and you can line up what you have to do and keeps things from overlapping".  Pt also expressed you can set aside certain amounts of time to complete the tasks.  Pt stated the communication with the group was good as well because they were able to hear each other and go from there.     Caroll Rancher, LRT/CTRS    Caroll Rancher A 03/06/2019 11:16 AM

## 2019-03-06 NOTE — Plan of Care (Signed)
Pt was able to identify the benefits of using better time management at completion of recreation therapy group sessions.   Caroll Rancher, LRT/CTRS

## 2019-03-06 NOTE — BHH Group Notes (Signed)
[facility] LCSW Group Therapy Note  Date/Time: 03/06/19, 1115  Type of Therapy/Topic:  Group Therapy:  Feelings about Diagnosis  Participation Level:  Active   Mood:pleasant   Description of Group:    This group will allow patients to explore their thoughts and feelings about diagnoses they have received. Patients will be guided to explore their level of understanding and acceptance of these diagnoses. Facilitator will encourage patients to process their thoughts and feelings about the reactions of others to their diagnosis, and will guide patients in identifying ways to discuss their diagnosis with significant others in their lives. This group will be process-oriented, with patients participating in exploration of their own experiences as well as giving and receiving support and challenge from other group members.   Therapeutic Goals: 1. Patient will demonstrate understanding of diagnosis as evidence by identifying two or more symptoms of the disorder:  2. Patient will be able to express two feelings regarding the diagnosis 3. Patient will demonstrate ability to communicate their needs through discussion and/or role plays  Summary of Patient Progress:Pt was active during group discussion and made a number of comments.  Pt also asked several questions.  Good participation.          Therapeutic Modalities:   Cognitive Behavioral Therapy Brief Therapy Feelings Identification   Daleen Squibb, LCSW

## 2019-03-06 NOTE — Discharge Summary (Signed)
Physician Discharge Summary Note  Patient:  John [name] is an 33 y.o., male MRN:  [id number] DOB:  [birth year] Patient phone:  [phone] (home)  Patient address:   [address] [place] [address],  Total Time spent with patient: 15 minutes  Date of Admission:  03/01/2019 Date of Discharge: 03/06/19  Reason for Admission:  Manic, disorganized, aggressive behaviors  Principal Problem: Bipolar affective disorder [facility]) Discharge Diagnoses: Principal Problem:   Bipolar affective disorder [facility])   Past Psychiatric History: Bipolar disorder. Extensive use of cannabis and chronic poor compliance  Past Medical History:  Past Medical History:  Diagnosis Date  . Bipolar 1 disorder (HCC)   . Mental disorder     Past Surgical History:  Procedure Laterality Date  . WISDOM TOOTH EXTRACTION     Family History: History reviewed. No pertinent family history. Family Psychiatric  History: unknown Social History:  Social History   Substance and Sexual Activity  Alcohol Use Yes     Social History   Substance and Sexual Activity  Drug Use Yes  . Types: Marijuana    Social History   Socioeconomic History  . Marital status: Single    Spouse name: Not on file  . Number of children: Not on file  . Years of education: Not on file  . Highest education level: Not on file  Occupational History  . Not on file  Social Needs  . Financial resource strain: Not on file  . Food insecurity:    Worry: Not on file    Inability: Not on file  . Transportation needs:    Medical: Not on file    Non-medical: Not on file  Tobacco Use  . Smoking status: Current Every Day Smoker    Types: Cigarettes  . Smokeless tobacco: Never Used  Substance and Sexual Activity  . Alcohol use: Yes  . Drug use: Yes    Types: Marijuana  . Sexual activity: Not on file  Lifestyle  . Physical activity:    Days per week: Not on file    Minutes per session: Not on file  . Stress: Not on file   Relationships  . Social connections:    Talks on phone: Not on file    Gets together: Not on file    Attends religious service: Not on file    Active member of club or organization: Not on file    Attends meetings of clubs or organizations: Not on file    Relationship status: Not on file  Other Topics Concern  . Not on file  Social History Narrative  . Not on file    Hospital Course: From MD's admission H&P 03/02/2019: This is the latest of multiple admissions and encounters in this healthcare system for John [name], 33 year old single patient is known to have a bipolar condition complicated by chronic cannabis dependency. He presented last week through the emergency department on 3/14 with disorganized and even dangerous behaviors but was stabilized very well there and attained a baseline status and was released on 3/15, however he did not comply with outpatient recommendations or medications and re-presented on 3/18 once again with manic symptoms.  He was described as tearing things up in the home by his roommate, running into traffic, and was consistently disorganized, requiring near constant redirection the emergency department and even threatening at times. He had received IM Geodon sublingual Zyprexa and IM Ativan in the emergency department. He denies currently wanting to harm himself or others and he answers most  questions in a very vague fashion, stating that he is here "to get better" that he is here to "get rest" but will not elaborate on specific symptoms.  John [name] was admitted for manic, disorganized, and aggressive behaviors. He was started on Risperdal 3 mg BID, Depakote 1000 mg QHS, Cogentin 0.5 mg BID, and Restoril 30 mg QHS. He attended group therapy on the unit. He responded well to treatment with no adverse effects reported. He remained on the [facility] unit for 5 days. He stabilized with medication and therapy. He was discharged on the medications listed below. He has shown  improvement with improved mood, affect, sleep, appetite, and interaction. He denies any SI/HI/AVH and contracts for safety. He agrees to follow up at [facility] (see below). Patient is provided with prescriptions and medication samples upon discharge. His brother is picking her up for discharge home.  Physical Findings: AIMS: Facial and Oral Movements Muscles of Facial Expression: None, normal Lips and Perioral Area: None, normal Jaw: None, normal Tongue: None, normal,Extremity Movements Upper (arms, wrists, hands, fingers): None, normal Lower (legs, knees, ankles, toes): None, normal, Trunk Movements Neck, shoulders, hips: None, normal, Overall Severity Severity of abnormal movements (highest score from questions above): None, normal Incapacitation due to abnormal movements: None, normal Patient's awareness of abnormal movements (rate only patient's report): No Awareness, Dental Status Current problems with teeth and/or dentures?: No Does patient usually wear dentures?: No  CIWA:    COWS:     Musculoskeletal: Strength & Muscle Tone: within normal limits Gait & Station: normal Patient leans: N/A  Psychiatric Specialty Exam: Physical Exam  Nursing note and vitals reviewed. Constitutional: He is oriented to person, place, and time. He appears well-developed and well-nourished.  Cardiovascular: Normal rate.  Respiratory: Effort normal.  Neurological: He is alert and oriented to person, place, and time.    Review of Systems  Constitutional: Negative.   Respiratory: Negative.   Cardiovascular: Negative.   Psychiatric/Behavioral: Positive for substance abuse (ETOH, THC). Negative for depression, hallucinations, memory loss and suicidal ideas. The patient is not nervous/anxious and does not have insomnia.     Blood pressure 134/90, pulse 83, temperature 97.6 F (36.4 C), temperature source Oral, resp. rate 18, height 6\' 3"  (1.905 m), weight 95.2 kg.Body mass index is 26.23 kg/m.   General Appearance: Well Groomed  Eye Contact:  Good  Speech:  Clear and Coherent and Normal Rate  Volume:  Normal  Mood:  Euthymic  Affect:  Appropriate and Congruent  Thought Process:  Coherent  Orientation:  Full (Time, Place, and Person)  Thought Content:  Logical  Suicidal Thoughts:  No  Homicidal Thoughts:  No  Memory:  Immediate;   Fair  Judgement:  Intact  Insight:  Fair  Psychomotor Activity:  Normal  Concentration:  Concentration: Good  Recall:  Fair  Fund of Knowledge:  Fair  Language:  Good  Akathisia:  No  Handed:  Right  AIMS (if indicated):     Assets:  Communication Skills Housing Resilience Social Support  ADL's:  Intact  Cognition:  WNL  Sleep:  Number of Hours: 4.75     Have you used any form of tobacco in the last 30 days? (Cigarettes, Smokeless Tobacco, Cigars, and/or Pipes): Yes  Has this patient used any form of tobacco in the last 30 days? (Cigarettes, Smokeless Tobacco, Cigars, and/or Pipes)  No  Blood Alcohol level:  Lab Results  Component Value Date   ETH <10 02/28/2019   ETH <10 02/24/2019  Metabolic Disorder Labs:  No results found for: HGBA1C, MPG No results found for: PROLACTIN Lab Results  Component Value Date   CHOL 134 07/26/2013   TRIG 41 07/26/2013   HDL 51 07/26/2013   CHOLHDL 2.6 07/26/2013   VLDL 8 07/26/2013   LDLCALC 75 07/26/2013    See Psychiatric Specialty Exam and Suicide Risk Assessment completed by Attending Physician prior to discharge.  Discharge destination:  Home  Is patient on multiple antipsychotic therapies at discharge:  No   Has Patient had three or more failed trials of antipsychotic monotherapy by history:  No  Recommended Plan for Multiple Antipsychotic Therapies: NA   Allergies as of 03/06/2019   No Known Allergies     Medication List    STOP taking these medications   divalproex 500 MG DR tablet Commonly known as:  DEPAKOTE Replaced by:  divalproex 500 MG 24 hr tablet     TAKE  these medications     Indication  amLODipine 10 MG tablet Commonly known as:  NORVASC Take 1 tablet (10 mg total) by mouth daily. Start taking on:  March 07, 2019  Indication:  High Blood Pressure Disorder   benztropine 0.5 MG tablet Commonly known as:  COGENTIN Take 1 tablet (0.5 mg total) by mouth 2 (two) times daily.  Indication:  Extrapyramidal Reaction caused by Medications   divalproex 500 MG 24 hr tablet Commonly known as:  DEPAKOTE ER Take 2 tablets (1,000 mg total) by mouth at bedtime. Replaces:  divalproex 500 MG DR tablet  Indication:  Manic Phase of Manic-Depression   Fish Oil 500 MG Caps Take 1,000 mg by mouth daily.  Indication:  Common Acne   fluticasone 50 MCG/ACT nasal spray Commonly known as:  FLONASE Place 2 sprays into both nostrils daily. Start taking on:  March 07, 2019  Indication:  Allergic Rhinitis   multivitamin with minerals Tabs tablet Take 1 tablet by mouth daily.  Indication:  Vitamin Deficiency   risperiDONE 3 MG tablet Commonly known as:  RISPERDAL Take 2 tablets (6 mg total) by mouth at bedtime.  Indication:  Manic Phase of Manic-Depression   temazepam 30 MG capsule Commonly known as:  RESTORIL Take 1 capsule (30 mg total) by mouth at bedtime.  Indication:  Trouble Sleeping      Follow-up Information    [facility] Follow up on 03/09/2019.   Why:  Hospital follow up appointment is Friday 3/27 at 8:30a.  At this time your appointment will be conducted over the telephone.  Contact information: [address] [place] [address] [phone]           Follow-up recommendations: Activity as tolerated. Diet as recommended by primary care physician. Keep all scheduled follow-up appointments as recommended.   Comments:   Patient is instructed to take all prescribed medications as recommended. Report any side effects or adverse reactions to your outpatient psychiatrist. Patient is instructed to abstain from alcohol and illegal  drugs while on prescription medications. In the event of worsening symptoms, patient is instructed to call the crisis hotline, 911, or go to the nearest emergency department for evaluation and treatment.  Signed: Aldean Baker, NP 03/06/2019, 9:30 AM

## 2019-03-06 NOTE — Progress Notes (Signed)
Recreation Therapy Notes  INPATIENT RECREATION TR PLAN  Patient Details Name: Kendale Rembold MRN: [id number] DOB: 08-05-1986 Today's Date: 03/06/2019  Rec Therapy Plan Is patient appropriate for Therapeutic Recreation?: Yes Treatment times per week: about 3 days Estimated Length of Stay: 5-7 days TR Treatment/Interventions: Group participation (Comment)  Discharge Criteria Pt will be discharged from therapy if:: Discharged Treatment plan/goals/alternatives discussed and agreed upon by:: Patient/family  Discharge Summary Short term goals set: See patient care plan Short term goals met: Complete Progress toward goals comments: Groups attended Which groups?: Wellness, Coping skills, Other (Comment)(Team building) Reason goals not met: None Therapeutic equipment acquired: N/A Reason patient discharged from therapy: Discharge from hospital Pt/family agrees with progress & goals achieved: Yes Date patient discharged from therapy: 03/06/19    Victorino Sparrow, LRT/CTRS  Ria Comment, Hazelgrace Bonham A 03/06/2019, 11:30 AM

## 2019-03-06 NOTE — Progress Notes (Signed)
  [facility] Adult Case Management Discharge Plan :  Will you be returning to the same living situation after discharge:  Yes,  own home At discharge, do you have transportation home?: Yes,  brother Do you have the ability to pay for your medications: No. Will work with [employer].   Release of information consent forms completed and in the chart;  Patient's signature needed at discharge.  Patient to Follow up at: Follow-up Information    [facility] Follow up on 03/09/2019.   Why:  Hospital follow up appointment is Friday 3/27 at 8:30a.  At this time your appointment will be conducted over the telephone.  Contact information: [address] [place] [address] [phone]           Next level of care provider has access to [facility] Link:no  Safety Planning and Suicide Prevention discussed: No. Attempts made.   Have you used any form of tobacco in the last 30 days? (Cigarettes, Smokeless Tobacco, Cigars, and/or Pipes): Yes  Has patient been referred to the Quitline?: Patient refused referral  Patient has been referred for addiction treatment: Yes, [facility]  Lorri Frederick, LCSW 03/06/2019, 11:09 AM

## 2019-03-06 NOTE — BHH Suicide Risk Assessment (Signed)
[facility] Discharge Suicide Risk Assessment   Principal Problem: Bipolar affective disorder [facility]) Discharge Diagnoses: Principal Problem:   Bipolar affective disorder (HCC)   Total Time spent with patient: 45 minutes  Musculoskeletal: Strength & Muscle Tone: within normal limits Gait & Station: normal Patient leans: N/A  Psychiatric Specialty Exam: ROS  Blood pressure 134/90, pulse 83, temperature 97.6 F (36.4 C), temperature source Oral, resp. rate 18, height 6\' 3"  (1.905 m), weight 95.2 kg.Body mass index is 26.23 kg/m.  General Appearance: Casual  Eye Contact::  Good  Speech:  Clear and Coherent409  Volume:  Normal  Mood:  Euthymic  Affect:  Congruent  Thought Process:  Coherent  Orientation:  Full (Time, Place, and Person)  Thought Content:  Tangential  Suicidal Thoughts:  No  Homicidal Thoughts:  No  Memory:  Immediate;   Good  Judgement:  Good  Insight:  Good  Psychomotor Activity:  Normal  Concentration:  Good  Recall:  Good  Fund of Knowledge:Good  Language: Good  Akathisia:  Negative  Handed:  Right  AIMS (if indicated):     Assets:  Communication Skills Desire for Improvement Housing Physical Health  Sleep:  Number of Hours: 4.75  Cognition: WNL  ADL's:  Intact   Mental Status Per Nursing Assessment::   On Admission:  NA  Demographic Factors:  Male  Loss Factors: NA  Historical Factors: NA  Risk Reduction Factors:   Sense of responsibility to family and Religious beliefs about death  Continued Clinical Symptoms:  Bipolar Disorder:   Mixed State  Cognitive Features That Contribute To Risk:  neg  Suicide Risk:  Minimal: No identifiable suicidal ideation.  Patients presenting with no risk factors but with morbid ruminations; may be classified as minimal risk based on the severity of the depressive symptoms  Follow-up Information    [facility] Follow up on 03/09/2019.   Why:  Hospital follow up appointment is Friday 3/27 at 8:30a.  At this  time your appointment will be conducted over the telephone.  Contact information: [address] [place] [address] [phone]           Plan Of Care/Follow-up recommendations:  Activity:  full  Lezli Danek, MD 03/06/2019, 8:45 AM

## 2019-10-02 ENCOUNTER — Encounter: Payer: Self-pay | Admitting: Internal Medicine

## 2019-10-02 ENCOUNTER — Ambulatory Visit: Payer: Self-pay | Admitting: Internal Medicine

## 2019-10-02 ENCOUNTER — Other Ambulatory Visit: Payer: Self-pay

## 2019-10-02 VITALS — BP 142/84 | HR 72 | Resp 14 | Ht 74.25 in | Wt 228.0 lb

## 2019-10-02 DIAGNOSIS — F319 Bipolar disorder, unspecified: Secondary | ICD-10-CM

## 2019-10-02 DIAGNOSIS — Z130 Encounter for screening for diseases of the blood and blood-forming organs and certain disorders involving the immune mechanism: Secondary | ICD-10-CM

## 2019-10-02 DIAGNOSIS — I10 Essential (primary) hypertension: Secondary | ICD-10-CM

## 2019-10-02 DIAGNOSIS — J302 Other seasonal allergic rhinitis: Secondary | ICD-10-CM | POA: Insufficient documentation

## 2019-10-02 DIAGNOSIS — R7303 Prediabetes: Secondary | ICD-10-CM

## 2019-10-02 DIAGNOSIS — Z13220 Encounter for screening for lipoid disorders: Secondary | ICD-10-CM

## 2019-10-02 DIAGNOSIS — Z23 Encounter for immunization: Secondary | ICD-10-CM

## 2019-10-02 DIAGNOSIS — E079 Disorder of thyroid, unspecified: Secondary | ICD-10-CM

## 2019-10-02 MED ORDER — MOMETASONE FUROATE 50 MCG/ACT NA SUSP: NASAL | 11 refills | Status: AC

## 2019-10-02 MED ORDER — AMLODIPINE BESYLATE 5 MG PO TABS: ORAL_TABLET | ORAL | 11 refills | Status: DC

## 2019-10-02 NOTE — Progress Notes (Signed)
    Subjective:    Patient ID: John [name], male   DOB: 07/26/1986, 33 y.o.   MRN: [id number]   HPI   Here to establish  1.  Prediabetes:  States was diagnosed about 3 years ago.  Cannot recall if A1C ever drawn.  Note one blood glucose in 2018 at 137, not clear if fasting, in this record.  States he has had weight loss.    2.  History of unknown type thyroid disease:  States this was 3 years ago.  Do not find this in this record.  States this was when he was very stressed with uncontrolled bipolar disorder, particularly manic phases and hospitalized at [facility].  3.  Hypertension:  Running and other exercises in the morning as well as meditation.  Diagnosed, he states 4-5 months ago, but always seems like when very anxious and in a manic state.    4.  Bipolar Disorder:  Currently with [facility].  Therapist is ?Floreen Comber.  Psychiatrist prescribes meds.  Currently taking Lamictal 50 mg daily.  Follow up appt is in 2 weeks.  Describes more manic symptoms as being his concern. He currently feels some self doubt and lower energy.   Has never attempted suicide when down in the past, but has thought about it in years past.  Never had any plans.    5.  Allergies:  Generally seasonal with spring and fall.  Nasal fluticasone every other day works for him.    Current Meds  Medication Sig  . Ascorbic Acid (VITAMIN C PO) Take by mouth. 1 daily  . fluticasone (FLONASE) 50 MCG/ACT nasal spray Place 2 sprays into both nostrils daily.  Marland Kitchen lamoTRIgine (LAMICTAL) 25 MG tablet TAKE 1 TABLET DAILY FOR 2 WEEKS, THEN TAKE TWO TABLETS DAILY  . Multiple Vitamin (MULTIVITAMIN WITH MINERALS) TABS Take 1 tablet by mouth daily.  . Omega-3 Fatty Acids (FISH OIL) 500 MG CAPS Take 1,000 mg by mouth daily.   No Known Allergies   Review of Systems    Objective:   BP (!) 142/84 (BP Location: Left Arm, Patient Position: Sitting, Cuff Size: Normal)   Pulse 72   Resp 14   Ht 6' 2.25" (1.886 m)   Wt 228 lb (103.4  kg)   BMI 29.08 kg/m   Physical Exam  HEENT:  PERRL, EOMI, no conjunctival injection.  TMs a bit dull, but without erythema.  Nasal mucosa normal.  Throat without cobbling. Neck:  Supple, No adenopathy, no thyromegaly Chest:  CTA CV:  RRR with normal S1 and S2, No S3, S4 or murmur.  Radial and DP pulses normal and equal Abd:  S, NT, No HSM or mass, + BS LE:  No edema.   Assessment & Plan  1.  Prediabetes:  CMP, A1C.  Has already changed lifestyle.  2.  Hypertension:  Restart Amlodipine but at 5 mg daily.  3.  History of unknown thyroid disease:  No findings on exam.  TSH today.  4.  Bipolar I disorder:  [facility].  Encouraged him to call his therapist or psychiatrist if he does not feel he is doing well on medication rather than just stopping.    5.  HM:  Influenza and TdP

## 2019-10-02 NOTE — Patient Instructions (Signed)

## 2019-10-03 LAB — COMPREHENSIVE METABOLIC PANEL
ALT: 22 IU/L (ref 0–44)
AST: 20 IU/L (ref 0–40)
Albumin/Globulin Ratio: 1.5 (ref 1.2–2.2)
Albumin: 4.6 g/dL (ref 4.0–5.0)
Alkaline Phosphatase: 72 IU/L (ref 39–117)
BUN/Creatinine Ratio: 12 (ref 9–20)
BUN: 12 mg/dL (ref 6–20)
Bilirubin Total: 0.3 mg/dL (ref 0.0–1.2)
CO2: 24 mmol/L (ref 20–29)
Calcium: 9.9 mg/dL (ref 8.7–10.2)
Chloride: 103 mmol/L (ref 96–106)
Creatinine, Ser: 1.02 mg/dL (ref 0.76–1.27)
GFR calc Af Amer: 113 mL/min/{1.73_m2} (ref >59)
GFR calc non Af Amer: 97 mL/min/{1.73_m2} (ref >59)
Globulin, Total: 3 g/dL (ref 1.5–4.5)
Glucose: 82 mg/dL (ref 65–99)
Potassium: 4.4 mmol/L (ref 3.5–5.2)
Sodium: 140 mmol/L (ref 134–144)
Total Protein: 7.6 g/dL (ref 6.0–8.5)

## 2019-10-03 LAB — CBC WITH DIFFERENTIAL/PLATELET
Basophils Absolute: 0 10*3/uL (ref 0.0–0.2)
Basos: 1 %
EOS (ABSOLUTE): 0.1 10*3/uL (ref 0.0–0.4)
Eos: 2 %
Hematocrit: 41.5 % (ref 37.5–51.0)
Hemoglobin: 14.1 g/dL (ref 13.0–17.7)
Immature Grans (Abs): 0 10*3/uL (ref 0.0–0.1)
Immature Granulocytes: 0 %
Lymphocytes Absolute: 1.7 10*3/uL (ref 0.7–3.1)
Lymphs: 30 %
MCH: 30.2 pg (ref 26.6–33.0)
MCHC: 34 g/dL (ref 31.5–35.7)
MCV: 89 fL (ref 79–97)
Monocytes Absolute: 0.4 10*3/uL (ref 0.1–0.9)
Monocytes: 7 %
Neutrophils Absolute: 3.6 10*3/uL (ref 1.4–7.0)
Neutrophils: 60 %
Platelets: 259 10*3/uL (ref 150–450)
RBC: 4.67 x10E6/uL (ref 4.14–5.80)
RDW: 13.1 % (ref 11.6–15.4)
WBC: 5.9 10*3/uL (ref 3.4–10.8)

## 2019-10-03 LAB — HGB A1C W/O EAG: Hgb A1c MFr Bld: 5.4 % (ref 4.8–5.6)

## 2019-10-03 LAB — LIPID PANEL W/O CHOL/HDL RATIO
Cholesterol, Total: 161 mg/dL (ref 100–199)
HDL: 48 mg/dL (ref >39)
LDL Chol Calc (NIH): 104 mg/dL — ABNORMAL HIGH (ref 0–99)
Triglycerides: 44 mg/dL (ref 0–149)
VLDL Cholesterol Cal: 9 mg/dL (ref 5–40)

## 2019-10-03 LAB — TSH: TSH: 4.37 u[IU]/mL (ref 0.450–4.500)

## 2019-10-10 ENCOUNTER — Ambulatory Visit: Payer: Self-pay | Admitting: Internal Medicine

## 2019-12-04 ENCOUNTER — Encounter: Payer: Self-pay | Admitting: Internal Medicine

## 2019-12-04 ENCOUNTER — Other Ambulatory Visit: Payer: Self-pay

## 2019-12-04 ENCOUNTER — Ambulatory Visit (INDEPENDENT_AMBULATORY_CARE_PROVIDER_SITE_OTHER): Payer: Self-pay | Admitting: Internal Medicine

## 2019-12-04 VITALS — BP 130/88 | HR 68 | Resp 12 | Ht 76.25 in | Wt 236.0 lb

## 2019-12-04 DIAGNOSIS — Z87898 Personal history of other specified conditions: Secondary | ICD-10-CM

## 2019-12-04 DIAGNOSIS — G4700 Insomnia, unspecified: Secondary | ICD-10-CM

## 2019-12-04 DIAGNOSIS — I10 Essential (primary) hypertension: Secondary | ICD-10-CM

## 2019-12-04 DIAGNOSIS — F319 Bipolar disorder, unspecified: Secondary | ICD-10-CM

## 2019-12-04 NOTE — Patient Instructions (Signed)

## 2019-12-04 NOTE — Progress Notes (Signed)
    Subjective:    Patient ID: John [name], male   DOB: [birth year], 33 y.o.   MRN: [id number]   HPI   1.  Hypertension:  Taking Amlodipine 5 mg daily and tolerating well.  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   2.  Bipolar Disorder:  Continues to follow with [facility].  States his psychiatrist is prescribing something to help with focus.  He is not sure what the medication is.  Continues with counselor, Cherre Blanc?  3.  History of Prediabetes:  States he is working out more and feels some of this is increased muscle mass.   A1C was in normal range (5.4%) in October.  4.  Difficulties with sleep with start of job this week --3rd shift.  He has already made good changes to improve the situation one week in.  Planning to get black out shades. 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  Had hearing, vision testing and blood/urine testing with work recently.  Current Meds  Medication Sig  . amLODipine (NORVASC) 5 MG tablet 1 tab by mouth daily.  Marland Kitchen lamoTRIgine (LAMICTAL) 25 MG tablet TAKE 1 TABLET DAILY FOR 2 WEEKS, THEN TAKE TWO TABLETS DAILY  . mometasone (NASONEX) 50 MCG/ACT nasal spray 2 sprays each nostril daily during your allergy seasons.  . Multiple Vitamin (MULTIVITAMIN WITH MINERALS) TABS Take 1 tablet by mouth daily.  . Omega-3 Fatty Acids (FISH OIL) 500 MG CAPS Take 1,000 mg by mouth daily.  Escitalopram 5 mg once daily for 1.5 months for depression. No Known Allergies   Review of Systems    Objective:   BP 130/88 (BP Location: Left Arm, Patient Position: Sitting, Cuff Size: Normal)   Pulse 68   Resp 12   Ht 6' 2.25" (1.886 m)   Wt 236 lb (107 kg)   BMI 30.10 kg/m   Physical Exam  NAD Lungs:  CTA CV:  RRR with normal S1 and S2, No S3, S4 or murmur.  Radial and DP pulses normal and equal LE:  No edema.   Assessment & Plan   1.  Hypertension:   Adequate control, but discussed watching weight with lifestyle changes to get bp into 120/70 range. May be where it is today due to changes with sleep wake cycle  2.  Bipolar Disorder:  As per [facility].    3.  Sleep issues:  Discussed sleep hygiene at length--he is already making good changes to improve sleep.  4.  Hx of prediabetes and recent increased weight gain--to watch this.  Went over diet and physical activity  CPE next visit.

## 2019-12-16 DIAGNOSIS — R7303 Prediabetes: Secondary | ICD-10-CM | POA: Insufficient documentation

## 2019-12-16 DIAGNOSIS — I10 Essential (primary) hypertension: Secondary | ICD-10-CM | POA: Insufficient documentation

## 2020-06-03 ENCOUNTER — Encounter: Payer: Self-pay | Admitting: Internal Medicine

## 2020-07-20 ENCOUNTER — Emergency Department (HOSPITAL_COMMUNITY): Payer: No Typology Code available for payment source

## 2020-07-20 ENCOUNTER — Encounter (HOSPITAL_COMMUNITY): Payer: Self-pay | Admitting: Radiology

## 2020-07-20 ENCOUNTER — Inpatient Hospital Stay (HOSPITAL_COMMUNITY)
Admission: EM | Admit: 2020-07-20 | Discharge: 2020-07-22 | DRG: 200 | Disposition: A | Discharge status: Home / Self Care | Payer: No Typology Code available for payment source | Attending: Surgery | Admitting: Surgery

## 2020-07-20 DIAGNOSIS — R7303 Prediabetes: Secondary | ICD-10-CM | POA: Diagnosis present

## 2020-07-20 DIAGNOSIS — I10 Essential (primary) hypertension: Secondary | ICD-10-CM | POA: Diagnosis present

## 2020-07-20 DIAGNOSIS — S270XXA Traumatic pneumothorax, initial encounter: Secondary | ICD-10-CM

## 2020-07-20 DIAGNOSIS — F312 Bipolar disorder, current episode manic severe with psychotic features: Secondary | ICD-10-CM | POA: Diagnosis present

## 2020-07-20 DIAGNOSIS — M25522 Pain in left elbow: Secondary | ICD-10-CM

## 2020-07-20 DIAGNOSIS — F3110 Bipolar disorder, current episode manic without psychotic features, unspecified: Secondary | ICD-10-CM

## 2020-07-20 DIAGNOSIS — Z23 Encounter for immunization: Secondary | ICD-10-CM

## 2020-07-20 DIAGNOSIS — S0181XA Laceration without foreign body of other part of head, initial encounter: Secondary | ICD-10-CM | POA: Diagnosis present

## 2020-07-20 DIAGNOSIS — R462 Strange and inexplicable behavior: Secondary | ICD-10-CM

## 2020-07-20 DIAGNOSIS — F319 Bipolar disorder, unspecified: Secondary | ICD-10-CM

## 2020-07-20 DIAGNOSIS — M25552 Pain in left hip: Secondary | ICD-10-CM | POA: Diagnosis present

## 2020-07-20 DIAGNOSIS — S060X9A Concussion with loss of consciousness of unspecified duration, initial encounter: Secondary | ICD-10-CM | POA: Diagnosis present

## 2020-07-20 DIAGNOSIS — S060X0A Concussion without loss of consciousness, initial encounter: Secondary | ICD-10-CM

## 2020-07-20 DIAGNOSIS — R109 Unspecified abdominal pain: Secondary | ICD-10-CM | POA: Diagnosis present

## 2020-07-20 DIAGNOSIS — Y92410 Unspecified street and highway as the place of occurrence of the external cause: Secondary | ICD-10-CM

## 2020-07-20 DIAGNOSIS — J302 Other seasonal allergic rhinitis: Secondary | ICD-10-CM | POA: Diagnosis present

## 2020-07-20 DIAGNOSIS — Z20822 Contact with and (suspected) exposure to covid-19: Secondary | ICD-10-CM | POA: Diagnosis present

## 2020-07-20 DIAGNOSIS — J939 Pneumothorax, unspecified: Secondary | ICD-10-CM

## 2020-07-20 DIAGNOSIS — S50312A Abrasion of left elbow, initial encounter: Secondary | ICD-10-CM | POA: Diagnosis present

## 2020-07-20 LAB — TYPE AND SCREEN
ABO/RH(D): B POS
Antibody Screen: NEGATIVE

## 2020-07-20 LAB — CBC WITH DIFFERENTIAL/PLATELET
Abs Immature Granulocytes: 0.04 10*3/uL (ref 0.00–0.07)
Basophils Absolute: 0 10*3/uL (ref 0.0–0.1)
Basophils Relative: 0 %
Eosinophils Absolute: 0 10*3/uL (ref 0.0–0.5)
Eosinophils Relative: 0 %
HCT: 45.6 % (ref 39.0–52.0)
Hemoglobin: 15.5 g/dL (ref 13.0–17.0)
Immature Granulocytes: 0 %
Lymphocytes Relative: 18 %
Lymphs Abs: 1.7 10*3/uL (ref 0.7–4.0)
MCH: 29 pg (ref 26.0–34.0)
MCHC: 34 g/dL (ref 30.0–36.0)
MCV: 85.2 fL (ref 80.0–100.0)
Monocytes Absolute: 0.7 10*3/uL (ref 0.1–1.0)
Monocytes Relative: 7 %
Neutro Abs: 7 10*3/uL (ref 1.7–7.7)
Neutrophils Relative %: 75 %
Platelets: 348 10*3/uL (ref 150–400)
RBC: 5.35 MIL/uL (ref 4.22–5.81)
RDW: 14.9 % (ref 11.5–15.5)
WBC: 9.4 10*3/uL (ref 4.0–10.5)
nRBC: 0 % (ref 0.0–0.2)

## 2020-07-20 LAB — COMPREHENSIVE METABOLIC PANEL
ALT: 34 U/L (ref 0–44)
AST: 40 U/L (ref 15–41)
Albumin: 4.6 g/dL (ref 3.5–5.0)
Alkaline Phosphatase: 62 U/L (ref 38–126)
Anion gap: 12 (ref 5–15)
BUN: 10 mg/dL (ref 6–20)
CO2: 23 mmol/L (ref 22–32)
Calcium: 9.7 mg/dL (ref 8.9–10.3)
Chloride: 100 mmol/L (ref 98–111)
Creatinine, Ser: 1.36 mg/dL — ABNORMAL HIGH (ref 0.61–1.24)
GFR calc Af Amer: >60 mL/min (ref >60)
GFR calc non Af Amer: >60 mL/min (ref >60)
Glucose, Bld: 108 mg/dL — ABNORMAL HIGH (ref 70–99)
Potassium: 4.1 mmol/L (ref 3.5–5.1)
Sodium: 135 mmol/L (ref 135–145)
Total Bilirubin: 0.7 mg/dL (ref 0.3–1.2)
Total Protein: 7.8 g/dL (ref 6.5–8.1)

## 2020-07-20 LAB — ABO/RH: ABO/RH(D): B POS

## 2020-07-20 LAB — LACTIC ACID, PLASMA
Lactic Acid, Venous: 1.5 mmol/L (ref 0.5–1.9)
Lactic Acid, Venous: 0.9 mmol/L (ref 0.5–1.9)

## 2020-07-20 LAB — RAPID URINE DRUG SCREEN, HOSP PERFORMED
Amphetamines: NOT DETECTED
Barbiturates: NOT DETECTED
Benzodiazepines: NOT DETECTED
Cocaine: NOT DETECTED
Opiates: NOT DETECTED
Tetrahydrocannabinol: POSITIVE — AB

## 2020-07-20 LAB — SARS CORONAVIRUS 2 BY RT PCR (HOSPITAL ORDER, PERFORMED IN ~~LOC~~ HOSPITAL LAB): SARS Coronavirus 2: NEGATIVE

## 2020-07-20 LAB — CBG MONITORING, ED: Glucose-Capillary: 101 mg/dL — ABNORMAL HIGH (ref 70–99)

## 2020-07-20 LAB — ETHANOL: Alcohol, Ethyl (B): <10 mg/dL (ref <10)

## 2020-07-20 MED ORDER — SODIUM CHLORIDE 0.9 % IV SOLN: 250.0000 mL | INTRAVENOUS | Status: DC | PRN

## 2020-07-20 MED ORDER — ACETAMINOPHEN 325 MG PO TABS: 650.0000 mg | ORAL_TABLET | ORAL | Status: DC | PRN

## 2020-07-20 MED ORDER — TRAMADOL HCL 50 MG PO TABS: 50.0000 mg | ORAL_TABLET | Freq: Four times a day (QID) | ORAL | Status: DC | PRN

## 2020-07-20 MED ORDER — TETANUS-DIPHTH-ACELL PERTUSSIS 5-2.5-18.5 LF-MCG/0.5 IM SUSP
0.5000 mL | Freq: Once | INTRAMUSCULAR | Status: AC
  Administered 2020-07-20: 0.5 mL via INTRAMUSCULAR
  Filled 2020-07-20: qty 0.5

## 2020-07-20 MED ORDER — SODIUM CHLORIDE 0.9% FLUSH: 3.0000 mL | INTRAVENOUS | Status: DC | PRN

## 2020-07-20 MED ORDER — SODIUM CHLORIDE 0.9% FLUSH: 3.0000 mL | Freq: Two times a day (BID) | INTRAVENOUS | Status: DC

## 2020-07-20 MED ORDER — OXYCODONE HCL 5 MG PO TABS
5.0000 mg | ORAL_TABLET | Freq: Four times a day (QID) | ORAL | Status: DC | PRN
  Administered 2020-07-21: 5 mg via ORAL
  Filled 2020-07-20: qty 1

## 2020-07-20 MED ORDER — DOCUSATE SODIUM 100 MG PO CAPS
100.0000 mg | ORAL_CAPSULE | Freq: Two times a day (BID) | ORAL | Status: DC
  Administered 2020-07-20 – 2020-07-22 (×3): 100 mg via ORAL
  Filled 2020-07-20 (×4): qty 1

## 2020-07-20 MED ORDER — KETOROLAC TROMETHAMINE 15 MG/ML IJ SOLN
15.0000 mg | Freq: Once | INTRAMUSCULAR | Status: AC
  Administered 2020-07-20: 15 mg via INTRAVENOUS
  Filled 2020-07-20: qty 1

## 2020-07-20 MED ORDER — IOHEXOL 300 MG/ML  SOLN
100.0000 mL | Freq: Once | INTRAMUSCULAR | Status: AC | PRN
  Administered 2020-07-20: 100 mL via INTRAVENOUS

## 2020-07-20 MED ORDER — ENOXAPARIN SODIUM 40 MG/0.4ML ~~LOC~~ SOLN
40.0000 mg | SUBCUTANEOUS | Status: DC
  Administered 2020-07-20: 40 mg via SUBCUTANEOUS
  Filled 2020-07-20 (×2): qty 0.4

## 2020-07-20 MED ORDER — ONDANSETRON 4 MG PO TBDP
4.0000 mg | ORAL_TABLET | Freq: Four times a day (QID) | ORAL | Status: DC | PRN
  Administered 2020-07-21: 4 mg via ORAL
  Filled 2020-07-20: qty 1

## 2020-07-20 MED ORDER — AMLODIPINE BESYLATE 5 MG PO TABS
5.0000 mg | ORAL_TABLET | Freq: Every day | ORAL | Status: DC
  Administered 2020-07-21 – 2020-07-22 (×2): 5 mg via ORAL
  Filled 2020-07-20 (×2): qty 1

## 2020-07-20 MED ORDER — MORPHINE SULFATE (PF) 4 MG/ML IV SOLN
4.0000 mg | Freq: Once | INTRAVENOUS | Status: AC
  Administered 2020-07-20: 4 mg via INTRAVENOUS
  Filled 2020-07-20: qty 1

## 2020-07-20 MED ORDER — METHOCARBAMOL 500 MG PO TABS
500.0000 mg | ORAL_TABLET | Freq: Four times a day (QID) | ORAL | Status: DC | PRN
  Administered 2020-07-20 – 2020-07-21 (×2): 500 mg via ORAL
  Filled 2020-07-20 (×3): qty 1

## 2020-07-20 MED ORDER — BENZTROPINE MESYLATE 0.5 MG PO TABS
0.5000 mg | ORAL_TABLET | Freq: Two times a day (BID) | ORAL | Status: DC
  Administered 2020-07-20 – 2020-07-22 (×4): 0.5 mg via ORAL
  Filled 2020-07-20 (×5): qty 1

## 2020-07-20 MED ORDER — ONDANSETRON HCL 4 MG/2ML IJ SOLN: 4.0000 mg | Freq: Four times a day (QID) | INTRAMUSCULAR | Status: DC | PRN

## 2020-07-20 MED ORDER — LACTATED RINGERS IV BOLUS
1000.0000 mL | Freq: Once | INTRAVENOUS | Status: AC
  Administered 2020-07-20: 1000 mL via INTRAVENOUS

## 2020-07-20 MED ORDER — LAMOTRIGINE 25 MG PO TABS
25.0000 mg | ORAL_TABLET | Freq: Two times a day (BID) | ORAL | Status: DC
  Administered 2020-07-20 – 2020-07-22 (×4): 25 mg via ORAL
  Filled 2020-07-20 (×4): qty 1
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
## 2020-07-20 NOTE — ED Notes (Signed)
Dermabond at bedside.  
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
## 2020-07-20 NOTE — Progress Notes (Signed)
Patient arrived to unit from ED.
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
## 2020-07-20 NOTE — ED Notes (Signed)
Patient transported to CT 

## 2020-07-21 ENCOUNTER — Observation Stay (HOSPITAL_COMMUNITY): Payer: No Typology Code available for payment source

## 2020-07-21 ENCOUNTER — Other Ambulatory Visit: Payer: Self-pay

## 2020-07-21 LAB — CBC
HCT: 46.3 % (ref 39.0–52.0)
Hemoglobin: 15.5 g/dL (ref 13.0–17.0)
MCH: 28.9 pg (ref 26.0–34.0)
MCHC: 33.5 g/dL (ref 30.0–36.0)
MCV: 86.4 fL (ref 80.0–100.0)
Platelets: 294 10*3/uL (ref 150–400)
RBC: 5.36 MIL/uL (ref 4.22–5.81)
RDW: 15 % (ref 11.5–15.5)
WBC: 13.3 10*3/uL — ABNORMAL HIGH (ref 4.0–10.5)
nRBC: 0 % (ref 0.0–0.2)

## 2020-07-21 LAB — BASIC METABOLIC PANEL
Anion gap: 8 (ref 5–15)
BUN: 11 mg/dL (ref 6–20)
CO2: 29 mmol/L (ref 22–32)
Calcium: 9.9 mg/dL (ref 8.9–10.3)
Chloride: 99 mmol/L (ref 98–111)
Creatinine, Ser: 1.31 mg/dL — ABNORMAL HIGH (ref 0.61–1.24)
GFR calc Af Amer: >60 mL/min (ref >60)
GFR calc non Af Amer: >60 mL/min (ref >60)
Glucose, Bld: 93 mg/dL (ref 70–99)
Potassium: 4.3 mmol/L (ref 3.5–5.1)
Sodium: 136 mmol/L (ref 135–145)

## 2020-07-21 LAB — HIV ANTIBODY (ROUTINE TESTING W REFLEX): HIV Screen 4th Generation wRfx: NONREACTIVE

## 2020-07-21 MED ORDER — ACETAMINOPHEN 325 MG PO TABS
650.0000 mg | ORAL_TABLET | Freq: Four times a day (QID) | ORAL | Status: DC
  Administered 2020-07-21 – 2020-07-22 (×4): 650 mg via ORAL
  Filled 2020-07-21 (×5): qty 2

## 2020-07-21 MED ORDER — IOHEXOL 9 MG/ML PO SOLN
500.0000 mL | ORAL | Status: AC
  Administered 2020-07-21 (×2): 500 mL via ORAL

## 2020-07-21 MED ORDER — SODIUM CHLORIDE 0.45 % IV SOLN
INTRAVENOUS | Status: DC
  Filled 2020-07-21 (×3): qty 1000

## 2020-07-21 MED ORDER — SODIUM CHLORIDE 0.45 % IV SOLN: INTRAVENOUS | Status: DC

## 2020-07-21 MED ORDER — OXYCODONE HCL 5 MG PO TABS
5.0000 mg | ORAL_TABLET | ORAL | Status: DC | PRN
  Filled 2020-07-21: qty 2

## 2020-07-21 MED ORDER — OXYCODONE HCL 5 MG PO TABS: 5.0000 mg | ORAL_TABLET | Freq: Four times a day (QID) | ORAL | 0 refills | Status: DC | PRN

## 2020-07-21 MED ORDER — METOPROLOL TARTRATE 5 MG/5ML IV SOLN
5.0000 mg | Freq: Once | INTRAVENOUS | Status: AC
  Administered 2020-07-21: 5 mg via INTRAVENOUS
  Filled 2020-07-21: qty 5

## 2020-07-21 MED ORDER — ACETAMINOPHEN 325 MG PO TABS: 650.0000 mg | ORAL_TABLET | Freq: Four times a day (QID) | ORAL | Status: DC | PRN

## 2020-07-21 MED ORDER — AMLODIPINE BESYLATE 5 MG PO TABS: 5.0000 mg | ORAL_TABLET | Freq: Every day | ORAL | 0 refills | Status: DC

## 2020-07-21 NOTE — Progress Notes (Addendum)
5:15pm  Patient started to exhibit bizarre behavior.He walked up to nursing station picked up the flower that were in a vase and begin to rearrange the flowers and tossing the ones that he didn't like onto the table. Then begin walking fast around the unit for 15 minutes while holding an ice pack to the left side of his head. I had him sit down to check his vitals. BP: 153/110 P: 68 also stated " I dont feel good." Paged Dr. Corliss Skains and received order to discontinue previous d/c order. Will continue to observe patient.
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
## 2020-07-21 NOTE — TOC CAGE-AID Note (Signed)
Transition of Care [facility]) - CAGE-AID Screening   Patient Details  Name: Khian Remo MRN: [id number] Date of Birth: May 06, 1986  Transition of Care [facility]) CM/SW Contact:    Emeterio Reeve, [place] Phone Number: 07/21/2020, 3:20 PM   Clinical Narrative:  CSW met with pt at bedside. CSW introduced self and explained her role at the hospital.  Pt reports that he drinks alcohol a couple times of week, but states there are times where he may have a small amount and other times where he drinks in large amounts. Pt denied substance use.   Patient was receptive to community resources to help stop or cut down on alcohol use. Pt was receptive to counseling and educational resources.  CAGE-AID Screening:    Have You Ever Felt You Ought to Cut Down on Your Drinking or Drug Use?: Yes Have People Annoyed You By Critizing Your Drinking Or Drug Use?: No Have You Felt Bad Or Guilty About Your Drinking Or Drug Use?: Yes Have You Ever Had a Drink or Used Drugs First Thing In The Morning to Steady Your Nerves or to Get Rid of a Hangover?: No CAGE-AID Score: 2  Substance Abuse Education Offered: Yes  Substance abuse interventions: Patient Counseling  Emeterio Reeve, Latanya Presser, [place] Social Worker [phone]

## 2020-07-21 NOTE — Discharge Instructions (Signed)
Concussion, Adult  A concussion is a brain injury from a hard, direct hit (trauma) to your head or body. This direct hit causes the brain to quickly shake back and forth inside the skull. A concussion may also be called a mild traumatic brain injury (TBI). Healing from this injury can take time. What are the causes? This condition is caused by:  A direct hit to your head, such as: ? Running into a player during a game. ? Being hit in a fight. ? Hitting your head on a hard surface.  A quick and sudden movement (jolt) of the head or neck, such as in a car crash. What are the signs or symptoms? The signs of a concussion can be hard to notice. They may be missed by you, family members, and doctors. You may look fine on the outside but may not act or feel normal. Physical symptoms  Headaches.  Being tired (fatigued).  Being dizzy.  Problems with body balance.  Problems seeing or hearing.  Being sensitive to light or noise.  Feeling sick to your stomach (nausea) or throwing up (vomiting).  Not sleeping or eating as you used to.  Loss of feeling (numbness) or tingling in the body.  Seizure. Mental and emotional symptoms  Problems remembering things.  Trouble focusing your mind (concentrating), organizing, or making decisions.  Being slow to think, act, react, speak, or read.  Feeling grouchy (irritable).  Having mood changes.  Feeling worried or nervous (anxious).  Feeling sad (depressed). How is this treated? This condition may be treated by:  Stopping sports or activity if you are injured. If you hit your head or have signs of concussion: ? Do not return to sports or activities the same day. ? Get checked by a doctor before you return to your activities.  Resting your body and your mind.  Being watched carefully, often at home.  Medicines to help with symptoms such as: ? Feeling sick to your stomach. ? Headaches. ? Problems with sleep.  Avoid taking strong  pain medicines (opioids) for a concussion.  Avoiding alcohol and drugs.  Being asked to go to a concussion clinic or a place to help you recover (rehabilitation center). Recovery from a concussion can take time. Return to activities only:  When you are fully healed.  When your doctor says it is safe. Follow these instructions at home: Activity  Limit activities that need a lot of thought or focus, such as: ? Homework or work for your job. ? Watching TV. ? Using the computer or phone. ? Playing memory games and puzzles.  Rest. Rest helps your brain heal. Make sure you: ? Get plenty of sleep. Most adults should get 7-9 hours of sleep each night. ? Rest during the day. Take naps or breaks when you feel tired.  Avoid activity like exercise until your doctor says its safe. Stop any activity that makes symptoms worse.  Do not do activities that could cause a second concussion, such as riding a bike or playing sports.  Ask your doctor when you can return to your normal activities, such as school, work, sports, and driving. Your ability to react may be slower. Do not do these activities if you are dizzy. General instructions   Take over-the-counter and prescription medicines only as told by your doctor.  Do not drink alcohol until your doctor says you can.  Watch your symptoms and tell other people to do the same. Other problems can occur after a concussion. Older   adults have a higher risk of serious problems.  Tell your work Production designer, theatre/television/film, teachers, Tax adviser, school counselor, coach, or Event organiser about your injury and symptoms. Tell them about what you can or cannot do.  Keep all follow-up visits as told by your doctor. This is important. How is this prevented?  It is very important that you do not get another brain injury. In rare cases, another injury can cause brain damage that will not go away, brain swelling, or death. The risk of this is greatest in the first 7-10 days  after a head injury. To avoid injuries: ? Stop activities that could lead to a second concussion, such as contact sports, until your doctor says it is okay. ? When you return to sports or activities:  Do not crash into other players. This is how most concussions happen.  Follow the rules.  Respect other players. ? Get regular exercise. Do strength and balance training. ? Wear a helmet that fits you well during sports, biking, or other activities.  Helmets can help protect you from serious skull and brain injuries, but they do not protect you from a concussion. Even when wearing a helmet, you should avoid being hit in the head. Contact a doctor if:  Your symptoms get worse or they do not get better.  You have new symptoms.  You have another injury. Get help right away if:  You have bad headaches or your headaches get worse.  You feel weak or numb in any part of your body.  You are mixed up (confused).  Your balance gets worse.  You keep throwing up.  You feel more sleepy than normal.  Your speech is not clear (is slurred).  You cannot recognize people or places.  You have a seizure.  Others have trouble waking you up.  You have behavior changes.  You have changes in how you see (vision).  You pass out (lose consciousness). Summary  A concussion is a brain injury from a hard, direct hit (trauma) to your head or body.  This condition is treated with rest and careful watching of symptoms.  If you keep having symptoms, call your doctor. This information is not intended to replace advice given to you by your health care provider. Make sure you discuss any questions you have with your health care provider. Document Revised: 07/20/2018 Document Reviewed: 07/20/2018 Elsevier Patient Education  2020 [employer]    Pneumothorax A pneumothorax is commonly called a collapsed lung. It is a condition in which air leaks from a lung and builds up between the thin layer  of tissue that covers the lungs (visceral pleura) and the interior wall of the chest cavity (parietal pleura). The air gets trapped outside the lung, between the lung and the chest wall (pleural space). The air takes up space and prevents the lung from fully expanding. This condition sometimes occurs suddenly with no apparent cause. The buildup of air may be small or large. A small pneumothorax may go away on its own. A large pneumothorax will require treatment and hospitalization. What are the causes? This condition may be caused by:  Trauma and injury to the chest wall.  Surgery and other medical procedures.  A complication of an underlying lung problem, especially chronic obstructive pulmonary disease (COPD) or emphysema. Sometimes the cause of this condition is not known. What increases the risk? You are more likely to develop this condition if:  You have an underlying lung problem.  You smoke.  You  are 38-61 years old, male, tall, and underweight.  You have a personal or family history of pneumothorax.  You have an eating disorder (anorexia nervosa). This condition can also happen quickly, even in people with no history of lung problems. What are the signs or symptoms? Sometimes a pneumothorax will have no symptoms. When symptoms are present, they can include:  Chest pain.  Shortness of breath.  Increased rate of breathing.  Bluish color to your lips or skin (cyanosis). How is this diagnosed? This condition may be diagnosed by:  A medical history and physical exam.  A chest X-ray, chest CT scan, or ultrasound. How is this treated? Treatment depends on how severe your condition is. The goal of treatment is to remove the extra air and allow your lung to expand back to its normal size.  For a small pneumothorax: ? No treatment may be needed. ? Extra oxygen is sometimes used to make it go away more quickly.  For a large pneumothorax or a pneumothorax that is causing  symptoms, a procedure is done to drain the air from your lungs. To do this, a health care provider may use: ? A needle with a syringe. This is used to suck air from a pleural space where no additional leakage is taking place. ? A chest tube. This is used to suck air where there is ongoing leakage into the pleural space. The chest tube may need to remain in place for several days until the air leak has healed.  In more severe cases, surgery may be needed to repair the damage that is causing the leak.  If you have multiple pneumothorax episodes or have an air leak that will not heal, a procedure called a pleurodesis may be done. A medicine is placed in the pleural space to irritate the tissues around the lung so that the lung will stick to the chest wall, seal any leaks, and stop any buildup of air in that space. If you have an underlying lung problem, severe symptoms, or a large pneumothorax you will usually need to stay in the hospital. Follow these instructions at home: Lifestyle  Do not use any products that contain nicotine or tobacco, such as cigarettes and e-cigarettes. These are major risk factors in pneumothorax. If you need help quitting, ask your health care provider.  Do not lift anything that is heavier than 10 lb (4.5 kg), or the limit that your health care provider tells you, until he or she says that it is safe.  Avoid activities that take a lot of effort (strenuous) for as long as told by your health care provider.  Return to your normal activities as told by your health care provider. Ask your health care provider what activities are safe for you.  Do not fly in an airplane or scuba dive until your health care provider says it is okay. General instructions  Take over-the-counter and prescription medicines only as told by your health care provider.  If a cough or pain makes it difficult for you to sleep at night, try sleeping in a semi-upright position in a recliner or by using  2 or 3 pillows.  If you had a chest tube and it was removed, ask your health care provider when you can remove the bandage (dressing). While the dressing is in place, do not allow it to get wet.  Keep all follow-up visits as told by your health care provider. This is important. Contact a health care provider if:  You  cough up thick mucus (sputum) that is yellow or green in color.  You were treated with a chest tube, and you have redness, increasing pain, or discharge at the site where it was placed. Get help right away if:  You have increasing chest pain or shortness of breath.  You have a cough that will not go away.  You begin coughing up blood.  You have pain that is getting worse or is not controlled with medicines.  The site where your chest tube was located opens up.  You feel air coming out of the site where the chest tube was placed.  You have a fever or persistent symptoms for more than 2-3 days.  You have a fever and your symptoms suddenly get worse. These symptoms may represent a serious problem that is an emergency. Do not wait to see if the symptoms will go away. Get medical help right away. Call your local emergency services (911 in the [place] Do not drive yourself to the hospital. Summary  A pneumothorax, commonly called a collapsed lung, is a condition in which air leaks from a lung and gets trapped between the lung and the chest wall (pleural space).  The buildup of air may be small or large. A small pneumothorax may go away on its own. A large pneumothorax will require treatment and hospitalization.  Treatment for this condition depends on how severe the pneumothorax is. The goal of treatment is to remove the extra air and allow the lung to expand back to its normal size. This information is not intended to replace advice given to you by your health care provider. Make sure you discuss any questions you have with your health care provider. Document Revised:  11/11/2017 Document Reviewed: 11/07/2017 Elsevier Patient Education  2020 [employer].

## 2020-07-21 NOTE — Patient Instructions (Signed)
Head Injury, Adult There are many types of head injuries. They can be as minor as a bump. Some head injuries can be worse. Worse injuries include:  A strong hit to the head that shakes the brain back and forth causing damage (concussion).  A bruise (contusion) of the brain. This means there is bleeding in the brain that can cause swelling.  A cracked skull (skull fracture).  Bleeding in the brain that gathers, gets thick (makes a clot), and forms a bump (hematoma). Most problems from a head injury come in the first 24 hours. However, you may still have side effects up to 7-10 days after your injury. It is important to watch your condition for any changes. You may need to be watched in the emergency department or urgent care, or you may need to stay in the hospital. What are the causes? There are many possible causes of a head injury. A serious head injury may be caused by:  A car accident.  Bicycle or motorcycle accidents.  Sports injuries.  Falls. What are the signs or symptoms? Symptoms of a head injury include a bruise, bump, or bleeding where the injury happened. Other physical symptoms may include:  Headache.  Feeling sick to your stomach (nauseous) or vomiting.  Dizziness.  Feeling tired.  Being uncomfortable around bright lights or loud noises.  Shaking movements that you cannot control (seizures).  Trouble being woken up.  Passing out (fainting). Mental or emotional symptoms may include:  Feeling grumpy or cranky.  Confusion and memory problems.  Having trouble paying attention or concentrating.  Changes in eating or sleeping habits.  Feeling worried or nervous (anxious).  Feeling sad (depressed). How is this treated? Treatment for this condition depends on how severe the injury is and the type of injury you have. The main goal is to prevent complications and to allow the brain time to heal. Mild head injury If you have a mild head injury, you may be  sent home and treatment may include:  Being watched. A responsible adult should stay with you for 24 hours after your injury and check on you often.  Physical rest.  Brain rest.  Pain medicines. Severe head injury If you have a severe head injury, treatment may include:  Being watched closely. This includes hospitalization with frequent physical exams.  Medicines to: ? Help with pain. ? Prevent shaking movements that you cannot control. ? Help with brain swelling.  Using a machine that helps you breathe (ventilator).  Treatments to manage the swelling inside the brain.  Brain surgery. This may be needed to: ? Remove a blood clot. ? Stop the bleeding. ? Remove a part of the skull. This allows room for the brain to swell. Follow these instructions at home: Activity  Rest.  Avoid activities that are hard or tiring.  Make sure you get enough sleep.  Limit activities that need a lot of thought or attention, such as: ? Watching TV. ? Playing memory games and puzzles. ? Job-related work or homework. ? Working on the computer, social media, and texting.  Avoid activities that could cause another head injury until your doctor says it is okay. This includes playing sports. Having another head injury, especially before the first one has healed, can be dangerous.  Ask your doctor when it is safe for you to go back to your normal activities, such as work or school. Ask your doctor for a step-by-step plan for slowly going back to your normal activities.  Ask   your doctor when you can drive, ride a bicycle, or use heavy machinery. Do not do these activities if you are dizzy. Lifestyle   Do not drink alcohol until your doctor says it is okay.  Do not use drugs.  If it is harder than usual to remember things, write them down.  If you are easily distracted, try to do one thing at a time.  Talk with family members or close friends when making important decisions.  Tell your  friends, family, a trusted coworker, and work manager about your injury, symptoms, and limits (restrictions). Have them watch for any problems that are new or getting worse. General instructions  Take over-the-counter and prescription medicines only as told by your doctor.  Have someone stay with you for 24 hours after your head injury. This person should watch you for any changes in your symptoms and be ready to get help.  Keep all follow-up visits as told by your doctor. This is important. How is this prevented?  Work on your balance and strength. This can help you avoid falls.  Wear a seatbelt when you are in a moving vehicle.  Wear a helmet when you: ? Ride a bicycle. ? Ski. ? Do any other sport or activity that has a risk of injury.  If you drink alcohol: ? Limit how much you use to:  0-1 drink a day for women.  0-2 drinks a day for men. ? Be aware of how much alcohol is in your drink. In the [place] one drink equals one 12 oz bottle of beer (355 mL), one 5 oz glass of wine (148 mL), or one 1 oz glass of hard liquor (44 mL).  Make your home safer by: ? Getting rid of clutter from the floors and stairs. This includes things that can make you trip. ? Using grab bars in bathrooms and handrails by stairs. ? Placing non-slip mats on floors and in bathtubs. ? Putting more light in dim areas. Get help right away if:  You have: ? A very bad headache that is not helped by medicine. ? Trouble walking or weakness in your arms and legs. ? Clear or bloody fluid coming from your nose or ears. ? Changes in how you see (vision). ? Shaking movements that you cannot control.  You lose your balance.  You vomit.  The black centers of your eyes (pupils) change in size.  Your speech is slurred.  Your dizziness gets worse.  You pass out.  You are sleepier than normal and have trouble staying awake.  Your symptoms get worse. These symptoms may be an emergency. Do not wait to see  if the symptoms will go away. Get medical help right away. Call your local emergency services (911 in the [place] Do not drive yourself to the hospital. Summary  There are many types of head injuries. They can be as minor as a bump. Some head injuries can be worse  Treatment for this condition depends on how severe the injury is and the type of injury you have.  Ask your doctor when it is safe for you to go back to your normal activities, such as work or school.  To prevent a head injury, wear a seat belt in a car, wear a helmet when you use a a bicycle, limit your alcohol use, and make your home safer. This information is not intended to replace advice given to you by your health care provider. Make sure you discuss any questions you   have with your health care provider. Document Revised: 03/22/2019 Document Reviewed: 12/22/2018 Elsevier Patient Education  2020 [employer] Inc.  
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
## 2020-07-22 ENCOUNTER — Encounter (HOSPITAL_COMMUNITY): Payer: Self-pay

## 2020-07-22 DIAGNOSIS — Z20822 Contact with and (suspected) exposure to covid-19: Secondary | ICD-10-CM | POA: Diagnosis present

## 2020-07-22 DIAGNOSIS — F3110 Bipolar disorder, current episode manic without psychotic features, unspecified: Secondary | ICD-10-CM

## 2020-07-22 DIAGNOSIS — S0181XA Laceration without foreign body of other part of head, initial encounter: Secondary | ICD-10-CM | POA: Diagnosis present

## 2020-07-22 DIAGNOSIS — M25552 Pain in left hip: Secondary | ICD-10-CM | POA: Diagnosis present

## 2020-07-22 DIAGNOSIS — I10 Essential (primary) hypertension: Secondary | ICD-10-CM | POA: Diagnosis present

## 2020-07-22 DIAGNOSIS — S270XXA Traumatic pneumothorax, initial encounter: Secondary | ICD-10-CM | POA: Diagnosis present

## 2020-07-22 DIAGNOSIS — Z23 Encounter for immunization: Secondary | ICD-10-CM | POA: Diagnosis not present

## 2020-07-22 DIAGNOSIS — F319 Bipolar disorder, unspecified: Secondary | ICD-10-CM

## 2020-07-22 DIAGNOSIS — S060X9A Concussion with loss of consciousness of unspecified duration, initial encounter: Secondary | ICD-10-CM | POA: Diagnosis present

## 2020-07-22 DIAGNOSIS — F312 Bipolar disorder, current episode manic severe with psychotic features: Secondary | ICD-10-CM | POA: Diagnosis present

## 2020-07-22 DIAGNOSIS — Y92410 Unspecified street and highway as the place of occurrence of the external cause: Secondary | ICD-10-CM | POA: Diagnosis not present

## 2020-07-22 DIAGNOSIS — J302 Other seasonal allergic rhinitis: Secondary | ICD-10-CM | POA: Diagnosis present

## 2020-07-22 DIAGNOSIS — R7303 Prediabetes: Secondary | ICD-10-CM | POA: Diagnosis present

## 2020-07-22 DIAGNOSIS — R462 Strange and inexplicable behavior: Secondary | ICD-10-CM

## 2020-07-22 DIAGNOSIS — S50312A Abrasion of left elbow, initial encounter: Secondary | ICD-10-CM | POA: Diagnosis present

## 2020-07-22 DIAGNOSIS — R109 Unspecified abdominal pain: Secondary | ICD-10-CM | POA: Diagnosis present

## 2020-07-22 MED ORDER — ESCITALOPRAM OXALATE 10 MG PO TABS
5.0000 mg | ORAL_TABLET | Freq: Every day | ORAL | Status: DC
  Administered 2020-07-22: 5 mg via ORAL
  Filled 2020-07-22: qty 1

## 2020-07-22 MED ORDER — HALOPERIDOL LACTATE 5 MG/ML IJ SOLN
10.0000 mg | Freq: Four times a day (QID) | INTRAMUSCULAR | Status: DC | PRN
  Administered 2020-07-22: 10 mg via INTRAMUSCULAR
  Filled 2020-07-22: qty 2
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
## 2020-07-22 NOTE — Plan of Care (Signed)
  Problem: Education: Goal: Knowledge of General Education information will improve Description: Including pain rating scale, medication(s)/side effects and non-pharmacologic comfort measures Outcome: Not Progressing   

## 2020-07-22 NOTE — Progress Notes (Signed)
Pt discharged home with his brother this evening. Discharge teaching given before leaving unit
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
## 2020-07-22 NOTE — Social Work (Signed)
CSW provided pt on bedside table the resources which he had discussed with CSW Sims on 8/9.   John [name], MSW, LCSW [facility] Clinical Social Work
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
## 2020-07-23 ENCOUNTER — Ambulatory Visit (HOSPITAL_COMMUNITY): Payer: No Payment, Other | Admitting: Licensed Clinical Social Worker

## 2020-07-23 ENCOUNTER — Other Ambulatory Visit: Payer: Self-pay

## 2020-07-25 ENCOUNTER — Encounter: Payer: Self-pay | Admitting: Internal Medicine

## 2020-07-29 ENCOUNTER — Other Ambulatory Visit: Payer: Self-pay

## 2020-07-29 ENCOUNTER — Ambulatory Visit (HOSPITAL_COMMUNITY)
Admission: EM | Admit: 2020-07-29 | Discharge: 2020-07-29 | Disposition: A | Discharge status: Home / Self Care | Payer: No Payment, Other | Attending: Registered Nurse | Admitting: Registered Nurse

## 2020-07-29 DIAGNOSIS — R462 Strange and inexplicable behavior: Secondary | ICD-10-CM

## 2020-07-29 DIAGNOSIS — R410 Disorientation, unspecified: Secondary | ICD-10-CM | POA: Diagnosis not present

## 2020-07-29 DIAGNOSIS — H538 Other visual disturbances: Secondary | ICD-10-CM | POA: Insufficient documentation

## 2020-07-29 DIAGNOSIS — Z1330 Encounter for screening examination for mental health and behavioral disorders, unspecified: Secondary | ICD-10-CM | POA: Diagnosis not present

## 2020-07-29 DIAGNOSIS — Z87820 Personal history of traumatic brain injury: Secondary | ICD-10-CM | POA: Insufficient documentation

## 2020-07-29 DIAGNOSIS — R519 Headache, unspecified: Secondary | ICD-10-CM | POA: Diagnosis not present

## 2020-07-29 DIAGNOSIS — G44309 Post-traumatic headache, unspecified, not intractable: Secondary | ICD-10-CM

## 2020-07-29 MED ORDER — LORAZEPAM 1 MG PO TABS
ORAL_TABLET | ORAL | Status: AC
  Administered 2020-07-29: 1 mg
  Filled 2020-07-29: qty 1

## 2020-07-29 MED ORDER — DIVALPROEX SODIUM ER 250 MG PO TB24: 500.0000 mg | ORAL_TABLET | Freq: Every day | ORAL | 2 refills | Status: DC

## 2020-07-29 MED ORDER — DIVALPROEX SODIUM ER 500 MG PO TB24: 500.0000 mg | ORAL_TABLET | Freq: Once | ORAL | 0 refills | Status: DC

## 2020-07-29 MED ORDER — DIVALPROEX SODIUM ER 500 MG PO TB24
500.0000 mg | ORAL_TABLET | Freq: Once | ORAL | Status: AC
  Administered 2020-07-29: 500 mg via ORAL
  Filled 2020-07-29: qty 1

## 2020-07-29 MED ORDER — LORAZEPAM 1 MG PO TABS: 1.0000 mg | ORAL_TABLET | Freq: Once | ORAL | Status: DC

## 2020-07-29 NOTE — Discharge Instructions (Addendum)
[facility] Neurologist in [place], [place] Address: [address], [place], [place] [address] Phone: [phone] Follow up:  For Traumatic Brain Injury If you don't hear anything from [facility] withing 2-3 days please call to set up an appointment  Keep scheduled appointment with  Julieanne Manson, MD. Call.   Specialty: Internal Medicine Why: Call and schedule a follow up appointment with PCP regarding high blood pressure Contact information: [address] [place] [address] [phone]   Follow up given after medical hospital discharge [facility] Specialty: Rehabilitation Outpatient physical and occupational therapy; rehab center will call you for an appointment, or you may call to schedule [address]  [id number] [facility]  [place] [address] [phone]   Sep8 Office Visit 60 with Julieanne Manson, MD Wednesday Aug 20, 2020 4:00 PM Please arrive 15 minutes prior to your appointment. This will allow [place] to verify and update your medical record and ensure a full appointment for you within the time allotted. [facility] [address] [place] [place] [address] [phone]         Go to Julieanne Manson, MD Wednesday Aug 20, 2020 Specialty: Internal Medicine 4:00 PM  Follow up for high blood pressure   [facility] [address] [place] [address] [phone]   Referrals after medical hospital discharge Ambulatory referral to Occupational Therapy  Complete by: As directed  [facility]  [phone]   [address]  [id number] [facility]  [place] [address]    Occupational Therapy  Specialty Services Required  Ambulatory referral to Physical Therapy  Complete by: As directed  [facility]  [phone]    [address]  [id number] [facility]  [place] [address]    Physical Medicine  Specialty Services Required  Ambulatory referral to Speech Therapy  Complete by: As directed  [facility]  [phone]   [address]  [id number] [facility]  [place] [address]

## 2020-07-29 NOTE — ED Notes (Signed)
Locker number [phone]
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
## 2020-07-29 NOTE — ED Notes (Addendum)
Patient got up and pulled cord down and wrapped arms up it. Patient attempted to put full body weight on the cord.  Staff was  redirecting him to get down and trying to keep patient calm. Nurse had to give medication to the patient.

## 2020-07-29 NOTE — ED Notes (Signed)
Patient pacing back and fourth  
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
## 2020-08-02 ENCOUNTER — Other Ambulatory Visit: Payer: Self-pay

## 2020-08-02 ENCOUNTER — Emergency Department (HOSPITAL_COMMUNITY)
Admission: EM | Admit: 2020-08-02 | Discharge: 2020-08-04 | Disposition: A | Discharge status: Home / Self Care | Payer: Self-pay | Attending: Emergency Medicine | Admitting: Emergency Medicine

## 2020-08-02 ENCOUNTER — Encounter (HOSPITAL_COMMUNITY): Payer: Self-pay | Admitting: Emergency Medicine

## 2020-08-02 DIAGNOSIS — Z20822 Contact with and (suspected) exposure to covid-19: Secondary | ICD-10-CM | POA: Insufficient documentation

## 2020-08-02 DIAGNOSIS — F3110 Bipolar disorder, current episode manic without psychotic features, unspecified: Secondary | ICD-10-CM | POA: Insufficient documentation

## 2020-08-02 DIAGNOSIS — F209 Schizophrenia, unspecified: Secondary | ICD-10-CM | POA: Insufficient documentation

## 2020-08-02 DIAGNOSIS — F312 Bipolar disorder, current episode manic severe with psychotic features: Secondary | ICD-10-CM | POA: Diagnosis present

## 2020-08-02 DIAGNOSIS — Z79899 Other long term (current) drug therapy: Secondary | ICD-10-CM | POA: Insufficient documentation

## 2020-08-02 DIAGNOSIS — I10 Essential (primary) hypertension: Secondary | ICD-10-CM | POA: Insufficient documentation

## 2020-08-02 DIAGNOSIS — Z87891 Personal history of nicotine dependence: Secondary | ICD-10-CM | POA: Insufficient documentation

## 2020-08-02 LAB — COMPREHENSIVE METABOLIC PANEL
ALT: 29 U/L (ref 0–44)
AST: 32 U/L (ref 15–41)
Albumin: 4.2 g/dL (ref 3.5–5.0)
Alkaline Phosphatase: 57 U/L (ref 38–126)
Anion gap: 14 (ref 5–15)
BUN: 19 mg/dL (ref 6–20)
CO2: 25 mmol/L (ref 22–32)
Calcium: 9.9 mg/dL (ref 8.9–10.3)
Chloride: 103 mmol/L (ref 98–111)
Creatinine, Ser: 1.12 mg/dL (ref 0.61–1.24)
GFR calc Af Amer: >60 mL/min (ref >60)
GFR calc non Af Amer: >60 mL/min (ref >60)
Glucose, Bld: 85 mg/dL (ref 70–99)
Potassium: 3.8 mmol/L (ref 3.5–5.1)
Sodium: 142 mmol/L (ref 135–145)
Total Bilirubin: 0.9 mg/dL (ref 0.3–1.2)
Total Protein: 7.3 g/dL (ref 6.5–8.1)

## 2020-08-02 LAB — SARS CORONAVIRUS 2 BY RT PCR (HOSPITAL ORDER, PERFORMED IN ~~LOC~~ HOSPITAL LAB): SARS Coronavirus 2: NEGATIVE

## 2020-08-02 LAB — CBC
HCT: 39.5 % (ref 39.0–52.0)
Hemoglobin: 13.4 g/dL (ref 13.0–17.0)
MCH: 29.8 pg (ref 26.0–34.0)
MCHC: 33.9 g/dL (ref 30.0–36.0)
MCV: 88 fL (ref 80.0–100.0)
Platelets: 362 10*3/uL (ref 150–400)
RBC: 4.49 MIL/uL (ref 4.22–5.81)
RDW: 14.5 % (ref 11.5–15.5)
WBC: 7.1 10*3/uL (ref 4.0–10.5)
nRBC: 0 % (ref 0.0–0.2)

## 2020-08-02 LAB — ETHANOL: Alcohol, Ethyl (B): <10 mg/dL (ref <10)

## 2020-08-02 MED ORDER — AMLODIPINE BESYLATE 5 MG PO TABS
5.0000 mg | ORAL_TABLET | Freq: Every day | ORAL | Status: DC
  Administered 2020-08-02 – 2020-08-04 (×3): 5 mg via ORAL
  Filled 2020-08-02 (×3): qty 1

## 2020-08-02 MED ORDER — DIVALPROEX SODIUM ER 500 MG PO TB24
500.0000 mg | ORAL_TABLET | Freq: Every day | ORAL | Status: DC
  Administered 2020-08-02 – 2020-08-03 (×2): 500 mg via ORAL
  Filled 2020-08-02 (×2): qty 1

## 2020-08-02 MED ORDER — ZIPRASIDONE MESYLATE 20 MG IM SOLR
10.0000 mg | Freq: Once | INTRAMUSCULAR | Status: AC
  Administered 2020-08-02: 10 mg via INTRAMUSCULAR
  Filled 2020-08-02: qty 20

## 2020-08-02 MED ORDER — STERILE WATER FOR INJECTION IJ SOLN
INTRAMUSCULAR | Status: AC
  Filled 2020-08-02: qty 10
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
## 2020-08-02 NOTE — BHH Counselor (Addendum)
TTS contacted [facility] ED to assess patient. Per [facility] ED patient's RN in room with security administering IM medication.

## 2020-08-02 NOTE — ED Notes (Signed)
Patient is resting quietly.  Sitter at bedside.
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
## 2020-08-03 LAB — VALPROIC ACID LEVEL: Valproic Acid Lvl: 19 ug/mL — ABNORMAL LOW (ref 50.0–100.0)

## 2020-08-03 MED ORDER — BACITRACIN-NEOMYCIN-POLYMYXIN 400-5-5000 EX OINT
TOPICAL_OINTMENT | Freq: Two times a day (BID) | CUTANEOUS | Status: DC
  Administered 2020-08-03 – 2020-08-04 (×3): 1 via TOPICAL
  Filled 2020-08-03: qty 1
  Filled 2020-08-03: qty 2

## 2020-08-03 MED ORDER — ZIPRASIDONE MESYLATE 20 MG IM SOLR
20.0000 mg | Freq: Once | INTRAMUSCULAR | Status: AC
  Administered 2020-08-03: 20 mg via INTRAMUSCULAR
  Filled 2020-08-03: qty 20

## 2020-08-03 MED ORDER — ZIPRASIDONE MESYLATE 20 MG IM SOLR
20.0000 mg | Freq: Two times a day (BID) | INTRAMUSCULAR | Status: DC | PRN
Start: 2020-08-03 — End: 2020-08-03

## 2020-08-03 MED ORDER — RISPERIDONE 1 MG PO TABS
1.0000 mg | ORAL_TABLET | Freq: Two times a day (BID) | ORAL | Status: DC
  Administered 2020-08-03 – 2020-08-04 (×3): 1 mg via ORAL
  Filled 2020-08-03 (×3): qty 1

## 2020-08-03 MED ORDER — ZIPRASIDONE MESYLATE 20 MG IM SOLR: 20.0000 mg | Freq: Once | INTRAMUSCULAR | Status: DC

## 2020-08-03 MED ORDER — ACETAMINOPHEN 325 MG PO TABS: 650.0000 mg | ORAL_TABLET | Freq: Once | ORAL | Status: DC

## 2020-08-03 MED ORDER — STERILE WATER FOR INJECTION IJ SOLN
INTRAMUSCULAR | Status: AC
  Filled 2020-08-03: qty 10

## 2020-08-03 MED ORDER — SM DOUBLE ANTIBIOTIC 500-10000 UNIT/GM EX OINT
TOPICAL_OINTMENT | Freq: Two times a day (BID) | CUTANEOUS | Status: DC
  Filled 2020-08-03: qty 28.4

## 2020-08-03 MED ORDER — STERILE WATER FOR INJECTION IJ SOLN
INTRAMUSCULAR | Status: AC
  Administered 2020-08-03: 10 mL
  Filled 2020-08-03: qty 10

## 2020-08-03 NOTE — ED Notes (Signed)
Pt did not want the injection of Geodon and argued against it but did not require being held down. Hands were put on for safety of staff do to previous unpredictability but pt did not actively resist injection. Immediately afterwards he contracted to go to his room.
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
## 2020-08-03 NOTE — ED Notes (Signed)
Called lab to add on Valproic Acid which they said they could do.

## 2020-08-03 NOTE — ED Notes (Addendum)
Pair of black pants with belt removed from over pt head denies attempting to strangle self but couldn't answer why he had the over his head. Item removed from pt room and placed in belongings locker. Room search including under mattress completed and no other items found. John [name] currently require redirection for coming out of room with karate like gestures and being resistant to staff redirectioned.  John [name] appeared to responding to internal stimuli as he was see mouthing word as if talking to self and responding someone speaking to him. When as if he need something to help him relax he agreed to a PRN geodon injection or was obtained and the medication was given.

## 2020-08-03 NOTE — ED Notes (Signed)
Pt tried to elope, was disorganized in thinking. Walking rapidly through unit towards the doors. Not violent but pushing through staff to get out of room and off of unit. Ophelia Shoulder NP informed.

## 2020-08-03 NOTE — BH Assessment (Signed)
Behavioral Health Disposition Note:  Per Ophelia Shoulder, NP, patient is recommended for inpatient treatment. [facility] is reviewing for possible placement.

## 2020-08-03 NOTE — ED Notes (Addendum)
Pt having somatic c/o discomfort to R eye, stated his contact was stuck in his R eye. On examination with light no contact len was found and none was recorded in his belongings that pt had contacts on arrival. John [name] is currently wearing his glasses and denies any change in his visual acuity denies pain or sensation of foreign body at this time.

## 2020-08-03 NOTE — Progress Notes (Signed)
Patient meets criteria for inpatient treatment. No appropriate or available beds at [facility]. CSW faxed referrals to the following facilities for review:  [facility]   [facility]   [facility]   [facility]   [facility]   [facility]   [facility]   [facility]   [facility]    TTS will continue to seek bed placement.  Alicianna Litchford R. Whitleigh Garramone, MSW, LCSW Clinical Social Work/Disposition Phone: [phone] Fax: [phone]    

## 2020-08-04 DIAGNOSIS — F312 Bipolar disorder, current episode manic severe with psychotic features: Secondary | ICD-10-CM

## 2020-08-04 LAB — RAPID URINE DRUG SCREEN, HOSP PERFORMED
Amphetamines: NOT DETECTED
Barbiturates: NOT DETECTED
Benzodiazepines: NOT DETECTED
Cocaine: NOT DETECTED
Opiates: NOT DETECTED
Tetrahydrocannabinol: POSITIVE — AB

## 2020-08-04 MED ORDER — DIVALPROEX SODIUM ER 500 MG PO TB24
500.0000 mg | ORAL_TABLET | Freq: Every evening | ORAL | 0 refills | Status: DC | PRN
Start: 2020-08-04 — End: 2020-09-09

## 2020-08-04 MED ORDER — RISPERIDONE 2 MG PO TABS: 2.0000 mg | ORAL_TABLET | Freq: Two times a day (BID) | ORAL | Status: DC

## 2020-08-04 MED ORDER — RISPERIDONE 2 MG PO TABS
2.0000 mg | ORAL_TABLET | Freq: Two times a day (BID) | ORAL | 0 refills | Status: DC
Start: 2020-08-04 — End: 2020-09-09

## 2020-08-04 MED ORDER — DIVALPROEX SODIUM ER 500 MG PO TB24
500.0000 mg | ORAL_TABLET | Freq: Every evening | ORAL | 0 refills | Status: DC | PRN
Start: 2020-08-04 — End: 2020-08-04
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
## 2020-08-04 NOTE — ED Provider Notes (Signed)
Patient with hx/o schizophrenia under IVC.  He has been seen and cleared by Psychiatry and IVC was rescinded by Psychiatrist.  On bedside eval patient is calm and appropriate, agreeable with discharge plan.  Will prescribe his recommended meds and d/c home with outpatient resources.     Tilden Fossa, MD 08/04/20 1354

## 2020-08-04 NOTE — Discharge Instructions (Addendum)
For your behavioral health needs, you are advised to follow up with [facility] during their next scheduled walk-in hours:       [facility]      [address]      [place], [place] [address]      [phone] Walk-in hours this Wednesday, August 06, 2020 are 1:00 pm - 5:00 pm.  Walk-in patients are seen on a first come, first served basis, so try to arrive as early as possible for the best chance of being seen that day.

## 2020-08-04 NOTE — BH Assessment (Signed)
[facility] Assessment Progress Note  Per Landry Mellow, MD, this pt does not require psychiatric hospitalization at this time.  Pt presents under IVC initiated by EDP Cathren Laine, MD which has been rescinded by Dr Jola Babinski.  Pt is to be discharged from [facility] with recommendation to follow up with [facility] during their next walk-in session, Wednesday, 08/06/2020 from 13:00 - 17:00.  This has been included in pt's discharge instructions.  Pt's nurse, Waynetta Sandy, has been notified.  Doylene Canning, MA Triage Specialist [phone]

## 2020-08-04 NOTE — Progress Notes (Signed)
TOC CM provided pt with 2 bus passes. States he plans to go this brothers home. Isidoro Donning RN CCM, [facility] ED TOC CM [phone]

## 2020-08-04 NOTE — ED Notes (Signed)
Pt DC d off unit to home per provider. DC information and resources given to and reviewed with pt, pt acknowledged understanding. Belongings given to pt. Pt ambulatory off unit, escorted by RN. Pt using bus for transportation, pt given a bus pass.

## 2020-08-07 ENCOUNTER — Other Ambulatory Visit: Payer: Self-pay

## 2020-08-07 ENCOUNTER — Encounter: Payer: Self-pay | Admitting: Neurology

## 2020-08-07 ENCOUNTER — Ambulatory Visit: Payer: Self-pay | Admitting: Neurology

## 2020-08-07 VITALS — BP 116/70 | HR 63 | Ht 76.5 in | Wt 198.0 lb

## 2020-08-07 DIAGNOSIS — R519 Headache, unspecified: Secondary | ICD-10-CM

## 2020-08-07 DIAGNOSIS — F39 Unspecified mood [affective] disorder: Secondary | ICD-10-CM

## 2020-08-07 DIAGNOSIS — H538 Other visual disturbances: Secondary | ICD-10-CM

## 2020-08-07 DIAGNOSIS — R4689 Other symptoms and signs involving appearance and behavior: Secondary | ICD-10-CM
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
## 2020-08-07 NOTE — Patient Instructions (Addendum)
Your headaches are not classic for migraine headaches.  Nevertheless, I think some of your medications that you are already prescribed or taking may help with headache reduction.  Please get more clarification regarding your Depakote.  You are taking this as needed, about 3 times a week.  I will use wondering if you took it daily and your headaches may improve as well.  In addition, Lamictal can also help with recurrent headaches as a preventative but you are not currently on it.  I am wondering if you were supposed to be on it.  Please clarify with Ms. Shuvon Rankin, nurse practitioner who referred you from behavioral health.  Please keep your follow-up appointment with your behavioral health provider.  I would like to see you back in 3 months, in the meantime, for now, we will proceed with work-up through our office in the form of blood work to look for autoimmune and inflammatory markers and we will proceed with a brain MRI.  We will call you with the results. We will do an EEG (brainwave test), which we will schedule. We will call you with the results. Please also schedule an eye examination as it has been a year and a half since your last eye exam, and you have experienced some blurry vision and sensitivity to light.

## 2020-08-08 LAB — COMPREHENSIVE METABOLIC PANEL
ALT: 27 IU/L (ref 0–44)
AST: 22 IU/L (ref 0–40)
Albumin/Globulin Ratio: 1.9 (ref 1.2–2.2)
Albumin: 4.5 g/dL (ref 4.0–5.0)
Alkaline Phosphatase: 71 IU/L (ref 48–121)
BUN/Creatinine Ratio: 13 (ref 9–20)
BUN: 13 mg/dL (ref 6–20)
Bilirubin Total: <0.2 mg/dL (ref 0.0–1.2)
CO2: 28 mmol/L (ref 20–29)
Calcium: 9.5 mg/dL (ref 8.7–10.2)
Chloride: 100 mmol/L (ref 96–106)
Creatinine, Ser: 1 mg/dL (ref 0.76–1.27)
GFR calc Af Amer: 115 mL/min/{1.73_m2} (ref >59)
GFR calc non Af Amer: 99 mL/min/{1.73_m2} (ref >59)
Globulin, Total: 2.4 g/dL (ref 1.5–4.5)
Glucose: 83 mg/dL (ref 65–99)
Potassium: 4.4 mmol/L (ref 3.5–5.2)
Sodium: 139 mmol/L (ref 134–144)
Total Protein: 6.9 g/dL (ref 6.0–8.5)

## 2020-08-08 LAB — CBC WITH DIFFERENTIAL/PLATELET
Basophils Absolute: 0 10*3/uL (ref 0.0–0.2)
Basos: 1 %
EOS (ABSOLUTE): 0.3 10*3/uL (ref 0.0–0.4)
Eos: 4 %
Hematocrit: 40.2 % (ref 37.5–51.0)
Hemoglobin: 13.5 g/dL (ref 13.0–17.7)
Immature Grans (Abs): 0 10*3/uL (ref 0.0–0.1)
Immature Granulocytes: 0 %
Lymphocytes Absolute: 2.4 10*3/uL (ref 0.7–3.1)
Lymphs: 36 %
MCH: 30.1 pg (ref 26.6–33.0)
MCHC: 33.6 g/dL (ref 31.5–35.7)
MCV: 90 fL (ref 79–97)
Monocytes Absolute: 0.4 10*3/uL (ref 0.1–0.9)
Monocytes: 6 %
Neutrophils Absolute: 3.5 10*3/uL (ref 1.4–7.0)
Neutrophils: 53 %
Platelets: 340 10*3/uL (ref 150–450)
RBC: 4.48 x10E6/uL (ref 4.14–5.80)
RDW: 15.1 % (ref 11.6–15.4)
WBC: 6.6 10*3/uL (ref 3.4–10.8)

## 2020-08-08 LAB — ANA W/REFLEX: Anti Nuclear Antibody (ANA): NEGATIVE

## 2020-08-08 LAB — RHEUMATOID FACTOR: Rheumatoid fact SerPl-aCnc: <10 IU/mL (ref 0.0–13.9)

## 2020-08-08 LAB — SEDIMENTATION RATE: Sed Rate: 2 mm/hr (ref 0–15)

## 2020-08-08 LAB — C-REACTIVE PROTEIN: CRP: <1 mg/L (ref 0–10)

## 2020-08-11 ENCOUNTER — Telehealth: Payer: Self-pay

## 2020-08-11 ENCOUNTER — Telehealth: Payer: Self-pay | Admitting: Neurology

## 2020-08-11 MED FILL — Amlodipine Besylate Tab 5 MG (Base Equivalent): ORAL | Qty: 5 | Status: AC

## 2020-08-11 MED FILL — Neomycin-Bacitracin-Polymyxin Oint: CUTANEOUS | Qty: 1 | Status: AC

## 2020-08-11 NOTE — Telephone Encounter (Signed)
self pay order sent to GI. They will reach out to the patient to schedule.  °

## 2020-08-11 NOTE — Progress Notes (Signed)
Labs were unremarkable, please update patient.

## 2020-08-11 NOTE — Telephone Encounter (Signed)
I called pt. No answer, left a message asking pt to call me back.   

## 2020-08-11 NOTE — Telephone Encounter (Signed)
-----   Message from Huston Foley, MD sent at 08/11/2020  7:30 AM EDT ----- Labs were unremarkable, please update patient.

## 2020-08-12 NOTE — Telephone Encounter (Signed)
I called pt. No answer, left a message asking pt to call me back.   

## 2020-08-13 NOTE — Telephone Encounter (Signed)
Left vm advising of labs ( this was my third attempt).  Pt advised to call back if he had questions.

## 2020-08-14 ENCOUNTER — Telehealth (HOSPITAL_COMMUNITY): Payer: No Payment, Other | Admitting: Psychiatry

## 2020-08-19 ENCOUNTER — Telehealth (HOSPITAL_COMMUNITY): Payer: No Payment, Other | Admitting: Psychiatry

## 2020-08-20 ENCOUNTER — Ambulatory Visit: Payer: Self-pay | Admitting: Internal Medicine

## 2020-08-20 ENCOUNTER — Encounter: Payer: Self-pay | Admitting: Internal Medicine

## 2020-08-20 VITALS — BP 130/94 | HR 60 | Resp 12 | Ht 76.25 in | Wt 200.0 lb

## 2020-08-20 DIAGNOSIS — F319 Bipolar disorder, unspecified: Secondary | ICD-10-CM

## 2020-08-20 DIAGNOSIS — I10 Essential (primary) hypertension: Secondary | ICD-10-CM

## 2020-08-20 MED ORDER — AMLODIPINE BESYLATE 5 MG PO TABS
5.0000 mg | ORAL_TABLET | Freq: Every day | ORAL | 11 refills | Status: DC
Start: 2020-08-20 — End: 2021-10-28
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
## 2020-08-27 ENCOUNTER — Ambulatory Visit (INDEPENDENT_AMBULATORY_CARE_PROVIDER_SITE_OTHER): Payer: Self-pay | Admitting: Neurology

## 2020-08-27 DIAGNOSIS — F39 Unspecified mood [affective] disorder: Secondary | ICD-10-CM

## 2020-08-27 DIAGNOSIS — H538 Other visual disturbances: Secondary | ICD-10-CM

## 2020-08-27 DIAGNOSIS — R519 Headache, unspecified: Secondary | ICD-10-CM

## 2020-08-27 DIAGNOSIS — R4182 Altered mental status, unspecified: Secondary | ICD-10-CM

## 2020-08-27 DIAGNOSIS — R4689 Other symptoms and signs involving appearance and behavior: Secondary | ICD-10-CM

## 2020-08-27 NOTE — Progress Notes (Signed)
Please call patient and advise him that his recent EEG was reported as normal in the awake state.

## 2020-08-27 NOTE — Procedures (Signed)
    History:  John [name] is a 34 year old gentleman with a history of recurrent headaches.  The patient has a history of bipolar disorder and hypertension as well.  He has had recent episodes of behavioral changes and is currently being evaluated for this.  This is a routine EEG.  No skull defects are noted.  Medications include Norvasc, Cogentin, Depakote, Lexapro, Nasonex, multivitamins, and Risperdal.  EEG classification: Normal awake  Description of the recording: The background rhythms of this recording consists of a fairly well modulated medium amplitude alpha rhythm of 10 Hz that is reactive to eye opening and closure. As the record progresses, the patient appears to remain in the waking state throughout the recording. Photic stimulation was performed, resulting in a bilateral and symmetric photic driving response. Hyperventilation was also performed, resulting in a minimal buildup of the background rhythm activities without significant slowing seen. At no time during the recording does there appear to be evidence of spike or spike wave discharges or evidence of focal slowing. EKG monitor shows no evidence of cardiac rhythm abnormalities with a heart rate of 66.  Impression: This is a normal EEG recording in the waking state. No evidence of ictal or interictal discharges are seen.

## 2020-08-28 ENCOUNTER — Telehealth: Payer: Self-pay

## 2020-08-28 NOTE — Telephone Encounter (Signed)
-----   Message from Huston Foley, MD sent at 08/27/2020  5:15 PM EDT ----- Please call patient and advise him that his recent EEG was reported as normal in the awake state.

## 2020-08-28 NOTE — Telephone Encounter (Addendum)
Pt notified of results via vm ( ok per dpr). I advised the pt call back if he had any questions/concerns.

## 2020-09-08 ENCOUNTER — Ambulatory Visit: Payer: Self-pay | Admitting: Internal Medicine

## 2020-09-08 ENCOUNTER — Telehealth: Payer: Self-pay

## 2020-09-08 DIAGNOSIS — F319 Bipolar disorder, unspecified: Secondary | ICD-10-CM

## 2020-09-08 NOTE — Telephone Encounter (Signed)
Pt left a VM asking for a call to discuss results and what the next steps are.

## 2020-09-09 ENCOUNTER — Encounter (HOSPITAL_COMMUNITY): Payer: Self-pay | Admitting: Psychiatry

## 2020-09-09 ENCOUNTER — Other Ambulatory Visit: Payer: Self-pay

## 2020-09-09 ENCOUNTER — Telehealth (INDEPENDENT_AMBULATORY_CARE_PROVIDER_SITE_OTHER): Payer: No Payment, Other | Admitting: Psychiatry

## 2020-09-09 DIAGNOSIS — F1220 Cannabis dependence, uncomplicated: Secondary | ICD-10-CM | POA: Diagnosis not present

## 2020-09-09 DIAGNOSIS — F3110 Bipolar disorder, current episode manic without psychotic features, unspecified: Secondary | ICD-10-CM | POA: Diagnosis not present

## 2020-09-09 MED ORDER — TRAZODONE HCL 50 MG PO TABS: 50.0000 mg | ORAL_TABLET | Freq: Every evening | ORAL | 1 refills | Status: DC | PRN

## 2020-09-09 MED ORDER — ZIPRASIDONE HCL 40 MG PO CAPS: 40.0000 mg | ORAL_CAPSULE | Freq: Two times a day (BID) | ORAL | 1 refills | Status: DC

## 2020-09-09 MED ORDER — ESCITALOPRAM OXALATE 10 MG PO TABS: 10.0000 mg | ORAL_TABLET | Freq: Every day | ORAL | 1 refills | Status: DC

## 2020-09-09 MED FILL — ESCITALOPRAM 10 MG TABLET: 10 | 30 days supply | Qty: 30 | Fill #0

## 2020-09-09 MED FILL — ZIPRASIDONE HCL 40 MG CAPS: 40 | 30 days supply | Qty: 60 | Fill #0

## 2020-09-09 MED FILL — traZODone HCL 50 MG TABS: 50 | 30 days supply | Qty: 30 | Fill #0

## 2020-09-09 NOTE — Telephone Encounter (Signed)
I called the pt and advised the labs and EEG we checked were normal. Next step is to f/u with [place] in November.  Pt scheduled for 10/16/2020 at 1030 am.

## 2020-09-09 NOTE — Progress Notes (Signed)
Called to remind patient of his appt tomorrow with Dr. Evelene Croon
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
## 2020-10-16 ENCOUNTER — Telehealth (HOSPITAL_COMMUNITY): Payer: Self-pay | Admitting: *Deleted

## 2020-10-16 ENCOUNTER — Ambulatory Visit: Payer: Self-pay | Admitting: Neurology

## 2020-10-16 NOTE — Telephone Encounter (Signed)
Call from patient stating he was breaking out with a rash and itching since starting medicine. Because of it he stopped it but now wants to know how to proceed. He has an appt soon with Dr but not sure he can wait. He is trying to find a job and feels he needs help with his depression sooner than later. Offered a walk in appt for next week and explained the hours and it being a first come first serve.

## 2020-10-21 ENCOUNTER — Encounter (HOSPITAL_COMMUNITY): Payer: Self-pay | Admitting: Psychiatry

## 2020-10-21 ENCOUNTER — Telehealth (INDEPENDENT_AMBULATORY_CARE_PROVIDER_SITE_OTHER): Payer: No Payment, Other | Admitting: Psychiatry

## 2020-10-21 ENCOUNTER — Other Ambulatory Visit: Payer: Self-pay

## 2020-10-21 DIAGNOSIS — F900 Attention-deficit hyperactivity disorder, predominantly inattentive type: Secondary | ICD-10-CM | POA: Diagnosis not present

## 2020-10-21 DIAGNOSIS — F3177 Bipolar disorder, in partial remission, most recent episode mixed: Secondary | ICD-10-CM

## 2020-10-21 MED ORDER — METHYLPHENIDATE HCL 10 MG PO TABS: ORAL_TABLET | ORAL | 0 refills | Status: DC
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
## 2020-10-31 ENCOUNTER — Other Ambulatory Visit: Payer: Self-pay

## 2020-10-31 ENCOUNTER — Ambulatory Visit (INDEPENDENT_AMBULATORY_CARE_PROVIDER_SITE_OTHER): Payer: No Payment, Other | Admitting: Clinical

## 2020-10-31 DIAGNOSIS — F3177 Bipolar disorder, in partial remission, most recent episode mixed: Secondary | ICD-10-CM | POA: Diagnosis not present
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
## 2020-11-23 NOTE — Progress Notes (Deleted)
34 y.o.M here to est PCP  Former [facility] pt.  Hx HTN, bipolar, preDM, cannibis use, ADHD

## 2020-11-24 ENCOUNTER — Ambulatory Visit: Payer: Self-pay | Admitting: Critical Care Medicine

## 2020-11-26 ENCOUNTER — Other Ambulatory Visit: Payer: Self-pay

## 2020-11-26 ENCOUNTER — Telehealth (INDEPENDENT_AMBULATORY_CARE_PROVIDER_SITE_OTHER): Payer: No Payment, Other | Admitting: Psychiatry

## 2020-11-26 ENCOUNTER — Encounter (HOSPITAL_COMMUNITY): Payer: Self-pay | Admitting: Psychiatry

## 2020-11-26 DIAGNOSIS — F900 Attention-deficit hyperactivity disorder, predominantly inattentive type: Secondary | ICD-10-CM

## 2020-11-26 DIAGNOSIS — F1220 Cannabis dependence, uncomplicated: Secondary | ICD-10-CM | POA: Diagnosis not present

## 2020-11-26 DIAGNOSIS — F3177 Bipolar disorder, in partial remission, most recent episode mixed: Secondary | ICD-10-CM | POA: Diagnosis not present

## 2020-11-26 NOTE — Progress Notes (Signed)
BH OP Progress Note   Virtual Visit via Telephone Note  I connected with John [name] on 11/26/20 at  2:20 PM EST by telephone and verified that I am speaking with the correct person using two identifiers.  Location: Patient: home Provider: Clinic   I discussed the limitations, risks, security and privacy concerns of performing an evaluation and management service by telephone and the availability of in person appointments. I also discussed with the patient that there may be a patient responsible charge related to this service. The patient expressed understanding and agreed to proceed.   I provided 18 minutes of non-face-to-face time during this encounter.     Patient Identification: John [name] MRN:  [id number] Date of Evaluation:  11/26/2020     Chief Complaint:   " I have not tried the medicine you gave me as I was waiting to flush out all the medicines in my system."  Visit Diagnosis:    ICD-10-CM   1. Bipolar 1 disorder, mixed, partial remission (HCC)  F31.77   2. Attention deficit hyperactivity disorder (ADHD), predominantly inattentive type  F90.0   3. Cannabis use disorder, moderate, dependence (HCC)  F12.20     History of Present Illness: Patient stated that he did not try Ritalin as of yet because he wanted to flush out all the medicines from system. He stated that he had stopped taking all the medicines in November and he wanted to give them a few more weeks to be completely out of his body. He then went on to apologize for his behavior last time. He stated that he knows his tone could have been a offensive , however, he stated that where he was saying was not that it was the Clinical research associate and he was just venting in general. Writer told him that he did not apologize since the writer knows that he was just venting and Clinical research associate did not take anything personally. He was appreciative on hearing this.  Patient admitted on another rant about how the medications can make him feel  and therefore he is going to try the Ritalin in the next few days. However he does not want to start with a whole tablet first and he is going to start with half tablet twice a day for 2 weeks and see how his body reacts to the medicine. And after he feels he can handle it he is good to go up to the whole tablet dose twice daily.  He stated that he is trying hard to stay away from marijuana and any other illicit substances.  He stated that after being on many medicines all his life he has concluded that the best option for him is to be in regular and intense therapy. He has started seeing Ms. Idalia Needle and has another appointment coming up end of this month.  He denied any paranoid delusions at this time.  PDMP was reviewed as per that he has not picked up the prescription for Ritalin as of yet.  Writer told him that the medications he was taking generally are out of the system in 48 to 72 hours and therefore it is completely safe for him to try Ritalin at this point.   Past Psychiatric History: Bipolar disorder versus schizophrenia, numerous psychiatry admissions, numerous presentations to the ED. has been prescribed several medications in the past including risperidone, Depakote, Lamictal, Seroquel, olanzapine.   Past Medical History:  Past Medical History:  Diagnosis Date  . Bipolar 1 disorder (HCC)   . Hypertension   .  Seasonal allergies   . Thyroid disease 2017   Unknown what exactly--[place] in 2017    Past Surgical History:  Procedure Laterality Date  . WISDOM TOOTH EXTRACTION      Family Psychiatric History: Mother, sister- schizophrenia  Family History:  Family History  Problem Relation Age of Onset  . Hypertension Mother   . Schizophrenia Mother   . Schizophrenia Sister     Social History:   Social History   Socioeconomic History  . Marital status: Single    Spouse name: Not on file  . Number of children: 0  . Years of education: 41  . Highest education level:  Some college, no degree  Occupational History  . Occupation: Psychologist, educational (Psychologist, forensic)  Tobacco Use  . Smoking status: Former Smoker    Packs/day: 0.25    Years: 8.00    Pack years: 2.00    Types: Cigarettes    Quit date: 05/02/2019    Years since quitting: 1.5  . Smokeless tobacco: Never Used  Vaping Use  . Vaping Use: Former  Substance and Sexual Activity  . Alcohol use: Yes    Comment: wine or whiskey once monthly  . Drug use: Not Currently    Types: Marijuana  . Sexual activity: Not on file  Other Topics Concern  . Not on file  Social History Narrative   ** Merged History Encounter **       Lives with his best friend/roommate, Fransico Michael. Friend is helping out. No significant other at this time.   Social Determinants of Health   Financial Resource Strain: Not on file  Food Insecurity: Not on file  Transportation Needs: Not on file  Physical Activity: Not on file  Stress: Not on file  Social Connections: Not on file     Allergies:  No Known Allergies  Metabolic Disorder Labs: Lab Results  Component Value Date   HGBA1C 5.4 10/02/2019   No results found for: PROLACTIN Lab Results  Component Value Date   CHOL 161 10/02/2019   TRIG 44 10/02/2019   HDL 48 10/02/2019   CHOLHDL 2.6 07/26/2013   VLDL 8 07/26/2013   LDLCALC 104 (H) 10/02/2019   LDLCALC 75 07/26/2013   Lab Results  Component Value Date   TSH 4.370 10/02/2019    Therapeutic Level Labs: No results found for: LITHIUM No results found for: CBMZ Lab Results  Component Value Date   VALPROATE 19 (L) 08/02/2020    Current Medications: Current Outpatient Medications  Medication Sig Dispense Refill  . acetaminophen (TYLENOL) 325 MG tablet Take 2 tablets (650 mg total) by mouth every 6 (six) hours as needed for mild pain or headache.    Marland Kitchen amLODipine (NORVASC) 5 MG tablet Take 1 tablet (5 mg total) by mouth daily. 30 tablet 11  . escitalopram (LEXAPRO) 10 MG tablet Take 1 tablet (10 mg total)  by mouth daily. 30 tablet 1  . methylphenidate (RITALIN) 10 MG tablet Take one tablet twice daily 60 tablet 0  . mometasone (NASONEX) 50 MCG/ACT nasal spray 2 sprays each nostril daily during your allergy seasons. 17 g 11  . traZODone (DESYREL) 50 MG tablet Take 1 tablet (50 mg total) by mouth at bedtime as needed for sleep. 30 tablet 1  . ziprasidone (GEODON) 40 MG capsule Take 1 capsule (40 mg total) by mouth 2 (two) times daily with a meal. 60 capsule 1   No current facility-administered medications for this visit.     Psychiatric Specialty Exam: Review of  Systems  There were no vitals taken for this visit.There is no height or weight on file to calculate BMI.  General Appearance: Fairly Groomed  Eye Contact:  Good  Speech:  Clear and Coherent and Normal Rate, TALKATIVE ++  Volume:  Normal  Mood:  Euthymic  Affect:  Congruent  Thought Process:  Disorganized and Descriptions of Associations: Circumstantial  Orientation:  Full (Time, Place, and Person)  Thought Content:  Illogical and Rumination  Suicidal Thoughts:  No  Homicidal Thoughts:  No  Memory:  Immediate;   Good Recent;   Good  Judgement:  Poor  Insight:  Poor  Psychomotor Activity:  Normal  Concentration:  Concentration: Good and Attention Span: Good  Recall:  Good  Fund of Knowledge:Good  Language: Good  Akathisia:  Negative  Handed:  Right  AIMS (if indicated):  Not done  Assets:  Communication Skills Desire for Improvement Financial Resources/Insurance Housing  ADL's:  Intact  Cognition: WNL  Sleep:  Fair   Screenings: AIMS   Flowsheet Row [facility]  AIMS Total Score 0    AUDIT   Flowsheet Row [facility]  Alcohol Use Disorder Identification Test Final Score (AUDIT) 6  2    CAGE-AID   Flowsheet Row ED to Hosp-Admission (Discharged) from 07/20/2020 in [facility]  SURGICAL  CAGE-AID Score 2    GAD-7   Flowsheet Row Counselor from 10/31/2020 in [facility]  Total GAD-7 Score 7    PHQ2-9   Flowsheet Row Counselor from [facility]31/2020 in [facility]  PHQ-2 Total Score 2  PHQ-9 Total Score 6      Assessment and Plan: Patient continues to ramble and ruminate about different things and has not tried Ritalin as he was waiting to flush out the other medications that he stopped more than 5 weeks ago out of his system. He stated that he is on it start with half tablet of Ritalin for 2 weeks and if he feels he can tolerate it he is good to go up to the whole tablet dose 2 times a day.  1. Bipolar 1 disorder, mixed, partial remission (HCC)   2. Attention deficit hyperactivity disorder (ADHD), predominantly inattentive type -  Doing a trial of Ritalin 10 mg BID to see how he does with it.     3. Cannabis use disorder, moderate, dependence (HCC)  Patient was once again explained that if he ends up in the hospital or has a ED visit during the next few weeks writer would not be prescribing this medication to him in the future again.   Patient verbalized his understanding.   He was encouraged to stay away from marijuana or any other illicit substances.  F/up in 6 weeks. Continue therapy with Ms. Idalia Needle.  Zena Amos, MD 12/15/20212:32 PM

## 2020-12-09 ENCOUNTER — Ambulatory Visit (INDEPENDENT_AMBULATORY_CARE_PROVIDER_SITE_OTHER): Payer: No Payment, Other | Admitting: Clinical

## 2020-12-09 ENCOUNTER — Other Ambulatory Visit: Payer: Self-pay

## 2020-12-09 DIAGNOSIS — F3177 Bipolar disorder, in partial remission, most recent episode mixed: Secondary | ICD-10-CM

## 2020-12-09 NOTE — Progress Notes (Signed)
   THERAPIST PROGRESS NOTE Virtual Visit via Video Note  I connected with John [name] on 12/09/20 at 10:00 AM EST by a video enabled telemedicine application and verified that I am speaking with the correct person using two identifiers.  Location: Patient: home Provider: office   I discussed the limitations of evaluation and management by telemedicine and the availability of in person appointments. The patient expressed understanding and agreed to proceed.  Follow Up Instructions: I discussed the assessment and treatment plan with the patient. The patient was provided an opportunity to ask questions and all were answered. The patient agreed with the plan and demonstrated an understanding of the instructions.   The patient was advised to call back or seek an in-person evaluation if the symptoms worsen or if the condition fails to improve as anticipated.    Session Time: 45 minutes  Participation Level: Active  Behavioral Response: CasualAlertpleasant  Type of Therapy: Individual Therapy  Treatment Goals addressed: Diagnosis: impulse control  Interventions: CBT  Summary:  John [name] is a 34 y.o. male who presents for the scheduled appointment oriented times five, appropriately dressed, and friendly. Client denied hallucinations and delusions. Client reported on today he was doing well. 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 Client reported his home situation has been stressful because he believes his roommates  girlfriend has been taking things out of his room. Client discussed with the therapist his irritability about the situation. Client discussed in the past how his anxiety has escalated things to a point of anger that he did not mean for it to. Client reported in the past his brother and family admitted him to the hospital because his anger took him out of character in a way that they  weren't accustomed to know him as. Client discussed thinking he can understand how suppressing a problem to that point is not ideal. Client also talked about other situations involving police where he was having a panic attack but he was expressing himself in a away that was interpreted as seemingly confrontational and physically aggressive. Client reported he pays attention to himself in understanding the activities that keep him calm such as singing, physical exercise, and/or talking to a friend when he feels upset.    Suicidal/Homicidal: Nowithout intent/plan  Therapist Response:  Therapist began the session by checking in and asking the client how he has been doing since last seen. Therapist actively listened to the clients thoughts and feelings and used empathy in response. Therapist engaged the client in a conversation asking about the consequences of past reactions to anxiety and anger provoking situations. Therapist engaged the client by modeling how his described emotions with behavior may be presented to another person.  Therapist used CBT to engage the client in brainstorming his triggers to recognize early changes in his thoughts, emotions and behaviors in reaction to particular situations. Therapist gave the client homework to record situations that he believes negatively trigger him to an angry mood and detail how he responded. Client was scheduled for next appointments.    Plan: Return again in 7 weeks for individual therapy.  Diagnosis: Bipolar 1 disorder, mixed, partial remission   Neena Rhymes Emery Binz, LCSW 12/09/2020

## 2020-12-22 ENCOUNTER — Ambulatory Visit: Payer: Self-pay | Admitting: Internal Medicine

## 2020-12-29 ENCOUNTER — Ambulatory Visit (INDEPENDENT_AMBULATORY_CARE_PROVIDER_SITE_OTHER): Payer: No Typology Code available for payment source | Admitting: Clinical

## 2020-12-29 ENCOUNTER — Other Ambulatory Visit: Payer: Self-pay

## 2020-12-29 DIAGNOSIS — F3177 Bipolar disorder, in partial remission, most recent episode mixed: Secondary | ICD-10-CM | POA: Diagnosis not present

## 2020-12-29 NOTE — Progress Notes (Signed)
   THERAPIST PROGRESS NOTE Virtual Visit via Video Note  I connected with John [name] on 12/29/20 at  9:00 AM EST by a video enabled telemedicine application and verified that I am speaking with the correct person using two identifiers.  Location: Patient: home Provider: office   I discussed the limitations of evaluation and management by telemedicine and the availability of in person appointments. The patient expressed understanding and agreed to proceed.  Follow Up Instructions: I discussed the assessment and treatment plan with the patient. The patient was provided an opportunity to ask questions and all were answered. The patient agreed with the plan and demonstrated an understanding of the instructions.   The patient was advised to call back or seek an in-person evaluation if the symptoms worsen or if the condition fails to improve as anticipated.   Session Time: 35 minutes  Participation Level: Active  Behavioral Response: CasualAlertDepressed  Type of Therapy: Individual Therapy  Treatment Goals addressed: Coping  Interventions: CBT  Summary:  John [name] is a 35 y.o. male who presents for the session oriented times five, appropriately dressed, and friendly. Client denied hallucinations and delusions. Client reported on today he is doing fairly well. Client reported since the last session he has moved out of his home that he shared with his roommate and roommates girlfriend. Client reported his roommate and his girlfriend were doing things such as moving and removing things from his belongings and were involved in drug related activity. Client reported he is currently living in between different friends houses until he can find a new place to live. Client reported he has also been transitioning with his work trying to find something that is consistent and will pay him well enough to maintain his own place. Client reported he has been making better distinctions with peoples  character since he has stopped using alcohol and/or marijuana. Client reported he feels that some people he use to be friends with actually don't reach out to check on him or offer him support. Client reported he has not begun his medication regimen because he feels it is a a"band aid" and not helpful. Client reported he has been working on eating better and taking vitamins as well as keeping himself motivated by getting sunlight and "moving forward". Client discussed wanting to be around people who do things genuinely and not out of wanting things in return.   Suicidal/Homicidal: Nowithout intent/plan  Therapist Response:  Therapist checked in with the client to ask how he has been doing since last seen. Therapist actively listened to the clients thought and feelings. Therapist engaged with the client to discuss barriers to his mood not improving in the past and how it has changed now. Therapist used CBT to discuss ways of coping with depression. Therapist assigned the client homework to work on using outside for sunlight and using positive social support. Client was scheduled for next appointment.   Plan: Return again in 6 weeks for individual theapy.  Diagnosis: Bipolar 1 disorder, mixed, partial remission   John [name] John Terrance, LCSW 12/29/2020

## 2021-01-09 ENCOUNTER — Encounter (HOSPITAL_COMMUNITY): Payer: Self-pay | Admitting: Psychiatry

## 2021-01-09 ENCOUNTER — Ambulatory Visit: Payer: Self-pay | Admitting: Family Medicine

## 2021-01-09 ENCOUNTER — Telehealth (INDEPENDENT_AMBULATORY_CARE_PROVIDER_SITE_OTHER): Payer: No Typology Code available for payment source | Admitting: Psychiatry

## 2021-01-09 ENCOUNTER — Other Ambulatory Visit: Payer: Self-pay

## 2021-01-09 DIAGNOSIS — F900 Attention-deficit hyperactivity disorder, predominantly inattentive type: Secondary | ICD-10-CM

## 2021-01-09 DIAGNOSIS — F319 Bipolar disorder, unspecified: Secondary | ICD-10-CM | POA: Diagnosis not present

## 2021-01-09 DIAGNOSIS — F1220 Cannabis dependence, uncomplicated: Secondary | ICD-10-CM
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
## 2021-02-09 ENCOUNTER — Ambulatory Visit (HOSPITAL_COMMUNITY): Payer: No Payment, Other | Admitting: Clinical

## 2021-03-02 ENCOUNTER — Other Ambulatory Visit: Payer: Self-pay

## 2021-03-02 ENCOUNTER — Ambulatory Visit (INDEPENDENT_AMBULATORY_CARE_PROVIDER_SITE_OTHER): Payer: No Payment, Other | Admitting: Clinical

## 2021-03-02 DIAGNOSIS — F319 Bipolar disorder, unspecified: Secondary | ICD-10-CM

## 2021-03-03 NOTE — Progress Notes (Signed)
   THERAPIST PROGRESS NOTE Virtual Visit via Video Note  I connected with John [name] on 03/02/2021 at  9:00 AM EDT by a video enabled telemedicine application and verified that I am speaking with the correct person using two identifiers.  Location: Patient: home Provider: office   I discussed the limitations of evaluation and management by telemedicine and the availability of in person appointments. The patient expressed understanding and agreed to proceed.  Follow Up Instructions: I discussed the assessment and treatment plan with the patient. The patient was provided an opportunity to ask questions and all were answered. The patient agreed with the plan and demonstrated an understanding of the instructions.   The patient was advised to call back or seek an in-person evaluation if the symptoms worsen or if the condition fails to improve as anticipated.   Session Time: 30 minutes  Participation Level: Active  Behavioral Response: CasualAlertEuthymic  Type of Therapy: Individual Therapy  Treatment Goals addressed: Coping  Interventions: CBT  Summary:  John [name] is a 35 y.o. male who presents for the scheduled session oriented times five, appropriately dressed, and friendly. Client denied hallucinations and delusions. Client reported on today that he is doing well. Client reported since last seen he has been working at [employer]. Client reported he does not like the work because of the environment amongst employees. Client reported he is looking to apply for other work. Client reported he is still living with his brother which is going well. Client reported he has not been taking his medication because he does not like how it makes him feel. Client reported since he has been living with his brother and moved from his previous situation that has helped with his emotional stability. Client reported his depression and anxiety symptoms have been very minimal. Client reported he is  working on getting his focus back on his routine of exercising. Client reported he has had difficulty with staying asleep. Client reported sleeping four to five hours on average is good but he has been sleeping for three hours.   GAD 7 : Generalized Anxiety Score 03/02/2021 10/31/2020  Nervous, Anxious, on Edge 1 1  Control/stop worrying 1 1  Worry too much - different things 1 1  Trouble relaxing 1 1  Restless 0 1  Easily annoyed or irritable 1 1  Afraid - awful might happen 0 1  Total GAD 7 Score 5 7  Anxiety Difficulty Very difficult Somewhat difficult   Flowsheet Row Counselor from 03/02/2021 in [facility]  PHQ-9 Total Score 8       Suicidal/Homicidal: Nowithout intent/plan  Therapist Response:  Therapist began the session asking the client how he has been since the last session. Therapist actively listened to the clients thoughts and feelings. Therapist used CBT to ask the client open end questions about his current biopsychosocial symptoms. Therapist completed updated SDOH tools with the client. Therapist assigned the client homework to work on establishing his routine which incorporates physical exercise. Client was scheduled for next appointment.    Plan: Return again in 5 weeks for individual therapy.  Diagnosis: Bipolar 1 disorder  John [name] John Yoshida, LCSW 03/02/2021

## 2021-03-12 ENCOUNTER — Telehealth (INDEPENDENT_AMBULATORY_CARE_PROVIDER_SITE_OTHER): Payer: No Payment, Other | Admitting: Psychiatry

## 2021-03-12 ENCOUNTER — Other Ambulatory Visit: Payer: Self-pay

## 2021-03-12 ENCOUNTER — Encounter (HOSPITAL_COMMUNITY): Payer: Self-pay | Admitting: Psychiatry

## 2021-03-12 DIAGNOSIS — F319 Bipolar disorder, unspecified: Secondary | ICD-10-CM | POA: Diagnosis not present

## 2021-03-12 DIAGNOSIS — F1220 Cannabis dependence, uncomplicated: Secondary | ICD-10-CM

## 2021-03-12 MED ORDER — ESCITALOPRAM OXALATE 10 MG PO TABS: 10.0000 mg | ORAL_TABLET | Freq: Every day | ORAL | 1 refills | Status: DC

## 2021-03-12 MED ORDER — LAMOTRIGINE 25 MG PO TABS: ORAL_TABLET | ORAL | 1 refills | Status: DC
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
## 2021-03-27 ENCOUNTER — Other Ambulatory Visit: Payer: Self-pay

## 2021-03-27 ENCOUNTER — Ambulatory Visit (HOSPITAL_COMMUNITY): Payer: No Payment, Other | Admitting: Clinical

## 2021-04-24 ENCOUNTER — Ambulatory Visit: Payer: Self-pay | Admitting: Internal Medicine

## 2021-05-01 ENCOUNTER — Telehealth (INDEPENDENT_AMBULATORY_CARE_PROVIDER_SITE_OTHER): Payer: No Payment, Other | Admitting: Psychiatry

## 2021-05-01 ENCOUNTER — Encounter (HOSPITAL_COMMUNITY): Payer: Self-pay | Admitting: Psychiatry

## 2021-05-01 ENCOUNTER — Other Ambulatory Visit: Payer: Self-pay

## 2021-05-01 DIAGNOSIS — F1220 Cannabis dependence, uncomplicated: Secondary | ICD-10-CM | POA: Diagnosis not present

## 2021-05-01 DIAGNOSIS — F319 Bipolar disorder, unspecified: Secondary | ICD-10-CM | POA: Diagnosis not present

## 2021-05-01 NOTE — Progress Notes (Signed)
BH OP Progress Note    Virtual Visit via Video Note  I connected with [name] on 05/01/21 at  9:00 AM EDT by a video enabled telemedicine application and verified that I am speaking with the correct person using two identifiers.  Location: Patient: Home Provider: Clinic   I discussed the limitations of evaluation and management by telemedicine and the availability of in person appointments. The patient expressed understanding and agreed to proceed.  I provided 17 minutes of non-face-to-face time during this encounter.     Patient Identification: John [name] MRN:  [id number] Date of Evaluation:  05/01/2021     Chief Complaint:   " I am doing okay.  Can you give me my CCA?"   Visit Diagnosis:    ICD-10-CM   1. Bipolar 1 disorder (HCC)  F31.9   2. Cannabis use disorder, moderate, dependence (HCC)  F12.20     History of Present Illness: Patient stated that he is doing okay.  As soon as the session began patient stated that he needs to CCA as he is working with [employer] for housing and they are asking for it. He stated that he still living with his sibling in their house.  He stated that his relationship with them is good and he is not too pressed for time to leave but he wants to be on his own as soon as he can. 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  He stated that he works 6 days in a row.  He stated that he has not had the opportunity to restart Lexapro and Lamictal as of yet because of his work schedule being hectic.  He stated that he wants to have at least 2 days off in a row so that he can see how he does when he is on the medications away from work. He stated that he has behaviors that he is sleeping bedtime by waking up early in the morning.  Waking up early in the morning helps him sleep early at night. He denied getting into any legal trouble lately.  He has not had any aggressive or impulsive  outbursts.  Regarding marijuana consumption, he stated that he has not used any in the last couple of months.  He denied having any auditory or visual hallucinations.  He denied any paranoid delusions today.  He was not as talkative and disorganized like last few visits.   Past Psychiatric History: Bipolar disorder versus schizophrenia, numerous psychiatry admissions, numerous presentations to the ED. has been prescribed several medications in the past including risperidone, Depakote, Lamictal, Seroquel, olanzapine.   Past Medical History:  Past Medical History:  Diagnosis Date  . Bipolar 1 disorder (HCC)   . Hypertension   . Seasonal allergies   . Thyroid disease 2017   Unknown what exactly--[place]ner in 2017    Past Surgical History:  Procedure Laterality Date  . WISDOM TOOTH EXTRACTION      Family Psychiatric History: Mother, sister- schizophrenia  Family History:  Family History  Problem Relation Age of Onset  . Hypertension Mother   . Schizophrenia Mother   . Schizophrenia Sister     Social History:   Social History   Socioeconomic History  . Marital status: Single    Spouse name: Not on file  . Number of children: 0  . Years of education: 47  . Highest education level: Some college, no degree  Occupational History  .  Occupation: Psychologist, educational (Psychologist, forensic)  Tobacco Use  . Smoking status: Former Smoker    Packs/day: 0.25    Years: 8.00    Pack years: 2.00    Types: Cigarettes    Quit date: 05/02/2019    Years since quitting: 2.0  . Smokeless tobacco: Never Used  Vaping Use  . Vaping Use: Former  Substance and Sexual Activity  . Alcohol use: Yes    Comment: wine or whiskey once monthly  . Drug use: Not Currently    Types: Marijuana  . Sexual activity: Not on file  Other Topics Concern  . Not on file  Social History Narrative   ** Merged History Encounter **       Lives with his best friend/roommate, Fransico Michael. Friend is helping out. No significant  other at this time.   Social Determinants of Health   Financial Resource Strain: Not on file  Food Insecurity: Not on file  Transportation Needs: Not on file  Physical Activity: Not on file  Stress: Not on file  Social Connections: Not on file     Allergies:  No Known Allergies  Metabolic Disorder Labs: Lab Results  Component Value Date   HGBA1C 5.4 10/02/2019   No results found for: PROLACTIN Lab Results  Component Value Date   CHOL 161 10/02/2019   TRIG 44 10/02/2019   HDL 48 10/02/2019   CHOLHDL 2.6 07/26/2013   VLDL 8 07/26/2013   LDLCALC 104 (H) 10/02/2019   LDLCALC 75 07/26/2013   Lab Results  Component Value Date   TSH 4.370 10/02/2019    Therapeutic Level Labs: No results found for: LITHIUM No results found for: CBMZ Lab Results  Component Value Date   VALPROATE 19 (L) 08/02/2020    Current Medications: Current Outpatient Medications  Medication Sig Dispense Refill  . amLODipine (NORVASC) 5 MG tablet Take 1 tablet (5 mg total) by mouth daily. 30 tablet 11  . escitalopram (LEXAPRO) 10 MG tablet Take 1 tablet (10 mg total) by mouth daily. 30 tablet 1  . lamoTRIgine (LAMICTAL) 25 MG tablet Take one tablet daily for 2 weeks then take 2 tablets daily 60 tablet 1  . mometasone (NASONEX) 50 MCG/ACT nasal spray 2 sprays each nostril daily during your allergy seasons. 17 g 11   No current facility-administered medications for this visit.     Psychiatric Specialty Exam: Review of Systems  There were no vitals taken for this visit.There is no height or weight on file to calculate BMI.  General Appearance: Fairly Groomed  Eye Contact:  Good  Speech:  Clear and Coherent and Normal Rate  Volume:  Normal  Mood:  Euthymic  Affect:  Congruent  Thought Process:  Disorganized and Descriptions of Associations: Circumstantial  Orientation:  Full (Time, Place, and Person)  Thought Content:  Logical  Suicidal Thoughts:  No  Homicidal Thoughts:  No  Memory:   Immediate;   Good Recent;   Good  Judgement:  Poor  Insight:  Poor  Psychomotor Activity:  Normal  Concentration:  Concentration: Good and Attention Span: Good  Recall:  Good  Fund of Knowledge:Good  Language: Good  Akathisia:  Negative  Handed:  Right  AIMS (if indicated):  Not done  Assets:  Communication Skills Desire for Improvement Financial Resources/Insurance Housing  ADL's:  Intact  Cognition: WNL    Sleep:  Good, reported that he is sleeping better now   Screenings: AIMS   [facility]  AIMS Total Score 0    AUDIT   Flowsheet Row [facility]  Alcohol Use Disorder Identification Test Final Score (AUDIT) 6 2    CAGE-AID   Flowsheet Row ED to Hosp-Admission (Discharged) from 07/20/2020 in [facility]  SURGICAL  CAGE-AID Score 2    GAD-7   Flowsheet Row Counselor from 03/02/2021 in [facility] Counselor from 10/31/2020 in [facility]  Total GAD-7 Score 5 7    PHQ2-9   Flowsheet Row Counselor from 03/02/2021 in [facility] Counselor from 10/31/2020 in [facility]  PHQ-2 Total Score 2 2  PHQ-9 Total Score 8 6    Flowsheet Row [facility] ED from [facility]24/2019 in [facility]  C-SSRS RISK CATEGORY No Risk No Risk      Assessment and Plan: Patient has not had the opportunity to start his medications as of yet due to his hectic work schedule.  He plans to start them when he has 2 days off in a row.  He seems to be calmer and not as talkative and disorganized.  He seems to be sleeping  better now.  He has not used any marijuana in the last couple of months.  He has a stable job now and is working with [employer] for housing.  1. Bipolar 1 disorder (HCC)  - Restart escitalopram (LEXAPRO) 10 MG tablet; Take 1 tablet (10 mg total) by mouth daily.  Dispense: 30 tablet; Refill: 1 - Restart lamoTRIgine (LAMICTAL) 25 MG tablet; Take one tablet daily for 2 weeks then take 2 tablets daily  Dispense: 60 tablet; Refill: 1  2. Cannabis use disorder, moderate, dependence (HCC) - He was reinforced to stay away from marijuana or any other illicit substances.  Follow-up in 2 months. Continue therapy with Ms. Idalia Needle.  Patient was informed that his care is being transferred to a different provider in the clinic as writer is leaving the office.   Zena Amos, MD 5/20/20229:02 AM

## 2021-06-29 ENCOUNTER — Ambulatory Visit (INDEPENDENT_AMBULATORY_CARE_PROVIDER_SITE_OTHER): Payer: No Payment, Other | Admitting: Clinical

## 2021-06-29 DIAGNOSIS — F319 Bipolar disorder, unspecified: Secondary | ICD-10-CM | POA: Diagnosis not present

## 2021-06-29 NOTE — Progress Notes (Signed)
THERAPIST PROGRESS NOTE Virtual Visit via Telephone Note  I connected with [name] on 06/29/21 at  9:00 AM EDT by telephone and verified that I am speaking with the correct person using two identifiers.  Location: Patient: home Provider: office   I discussed the limitations, risks, security and privacy concerns of performing an evaluation and management service by telephone and the availability of in person appointments. I also discussed with the patient that there may be a patient responsible charge related to this service. The patient expressed understanding and agreed to proceed.   Follow Up Instructions: I discussed the assessment and treatment plan with the patient. The patient was provided an opportunity to ask questions and all were answered. The patient agreed with the plan and demonstrated an understanding of the instructions.   The patient was advised to call back or seek an in-person evaluation if the symptoms worsen or if the condition fails to improve as anticipated.   Session Time: 35 minutes  Participation Level: Active  Behavioral Response: NAAlertDepressed  Type of Therapy: Individual Therapy  Treatment Goals addressed: Coping  Interventions: CBT and Supportive  Summary:  John [name] is a 35 y.o. male who presents for the scheduled session oriented x5, appropriately dressed, and friendly. Client denied hallucinations and delusions. Client reported on today he has been managing fairly well.  Client reported recently he has been helping to take care of his sister. Client reported his sister also has a history of mental health sickness. Client reported that he has otherwise been staying with his brother which has been working out okay. Client reported he continues to work at [employer] but is looking for another job that is not in [employer].  Client reported he feels like he needs his own space. Client reported since he was last seen he has not  taken his medications.  Client reported he experienced some side effects such as muscle spasms and fatigue which he did not like so he discontinued use on his own decision.  Client reported he noticed that his focus is "off" and sometimes experiences irritability. Client reported he is getting 6 to 7 hours of sleep per night.  Client reported his current stressor include relationship with his family members. Client reported at times he feels unappreciated by his family for the efforts he takes to do things for them.  Client reported he feels like his mental health is disregarded as "laziness" and that frustrates him.  Client reported his sister is a big trigger for him.  Client reported he has found himself in multiple situations helping his sister receive crisis services for her mental health problems.  Client reported the sister denies her problems and pushes away from getting help which has been tiring for him trying to make sure that she is okay.  Client reported that he compares the feeling to being a parent for his sister.   Suicidal/Homicidal: Nowithout intent/plan  Therapist Response:  Therapist again a session asking the client how he has been doing since last seen Therapist used CBT to utilize active listening and positive emotional support towards the client's thoughts and feelings. Therapist used CBT to engage with the client to identify triggers for his irritable mood. Therapist used CBT to normalize the client's feelings understanding the origin stems from childhood experiences. Therapist assigned the client homework to identify healthy boundaries that he can implement. Client was scheduled for next appointment.    Plan: Return again in 4 weeks.  Diagnosis:  Bipolar 1 disorder    John Rhymes Gildardo Tickner, LCSW 06/29/2021

## 2021-06-30 ENCOUNTER — Other Ambulatory Visit: Payer: Self-pay

## 2021-06-30 ENCOUNTER — Telehealth (INDEPENDENT_AMBULATORY_CARE_PROVIDER_SITE_OTHER): Payer: No Payment, Other | Admitting: Physician Assistant

## 2021-06-30 ENCOUNTER — Encounter (HOSPITAL_COMMUNITY): Payer: Self-pay | Admitting: Physician Assistant

## 2021-06-30 DIAGNOSIS — F900 Attention-deficit hyperactivity disorder, predominantly inattentive type: Secondary | ICD-10-CM

## 2021-06-30 DIAGNOSIS — F1220 Cannabis dependence, uncomplicated: Secondary | ICD-10-CM

## 2021-06-30 DIAGNOSIS — F319 Bipolar disorder, unspecified: Secondary | ICD-10-CM

## 2021-06-30 MED ORDER — ATOMOXETINE HCL 40 MG PO CAPS: 40.0000 mg | ORAL_CAPSULE | Freq: Every day | ORAL | 1 refills | Status: DC

## 2021-06-30 NOTE — Progress Notes (Signed)
BH MD/PA/NP OP Progress Note  Virtual Visit via Telephone Note  I connected with John Gravelavid [name] on 06/30/21 at  9:30 AM EDT by telephone and verified that I am speaking with the correct person using two identifiers.  Location: Patient: Home Provider: Clinic   I discussed the limitations, risks, security and privacy concerns of performing an evaluation and management service by telephone and the availability of in person appointments. I also discussed with the patient that there may be a patient responsible charge related to this service. The patient expressed understanding and agreed to proceed.  Follow Up Instructions:   I discussed the assessment and treatment plan with the patient. The patient was provided an opportunity to ask questions and all were answered. The patient agreed with the plan and demonstrated an understanding of the instructions.   The patient was advised to call back or seek an in-person evaluation if the symptoms worsen or if the condition fails to improve as anticipated.  I provided 25 minutes of non-face-to-face time during this encounter.  Meta HatchetUchenna E Grady Mohabir, PA   06/30/2021 1:43 PM John [name]  MRN:  [id number]  Chief Complaint: Follow up and medication management  HPI:   John GravelDavid Cayson a 35 year old male with a past psychiatric history significant for bipolar disorder and cannabis use disorder who [facility] via virtual telephone visit for follow-up and medication management.  Patient is currently being managed on the following medications:  Lexapro 10 mg daily for Lamictal 25 mg 2 times daily  Patient reports that he has not been taking his medications due to experiencing side effects with past use.  When using these medications in the past, patient reports that he experienced premature ejaculation as well as urinary frequency.  Patient states that when he was placed on his current regimen of medications,  he never initiated them.  Prior to being placed back on Lamictal and Lexapro, patient was taking Geodon.  The reason he discontinued taking Geodon was due to experiencing drowsiness and being less active/overly sedated.  Patient has been on the following medications in the past: Risperidone and Depakote.  Patient reports that his mood has been even and level.  Patient denies experiencing outbursts nor has he experienced manic episodes.  Patient expresses that he is hesitant on being placed on new medications due to the effects that the medications may have on his body.  He states that while on his past psychiatric medications, he states that it took away from his focus and concentration.  He states that his mood has been stable since discontinuing his medications.  A PHQ-9 screen was performed with the patient scoring a 14.  A GAD-7 screen was also performed with the patient scoring a 5.  Patient is calm, cooperative, and fully engaged in conversation during the encounter.  Patient reports that his mood has been so-so/middle of the road.  Patient denies suicidal or homicidal ideations.  He further denies auditory or visual hallucinations and does not appear to be responding to internal/external stimuli.  Patient endorses fair sleep and receives on average 6 hours of sleep each night.  He reports that following his sleep has not been an issue as long as he continues to stick with a regular sleep routine.  Patient endorses good appetite and eats on average 2 meals per day.  Patient endorses alcohol consumption occasionally.  He endorses tobacco use through the use of hookahs.  Patient denies illicit drug use.  Visit Diagnosis:  ICD-10-CM   1. Attention deficit hyperactivity disorder (ADHD), predominantly inattentive type  F90.0     2. Cannabis use disorder, moderate, dependence (HCC)  F12.20     3. Bipolar 1 disorder (HCC)  F31.9       Past Psychiatric History:   Bipolar disorder versus  schizophrenia, numerous psychiatry admissions, numerous presentations to the ED. has been prescribed several medications in the past including risperidone, Depakote, Lamictal, Seroquel, olanzapine.  Past Medical History:  Past Medical History:  Diagnosis Date   Bipolar 1 disorder (HCC)    Hypertension    Seasonal allergies    Thyroid disease 2017   Unknown what exactly--[place]ner in 2017    Past Surgical History:  Procedure Laterality Date   WISDOM TOOTH EXTRACTION      Family Psychiatric History:  Mother, sister- schizophrenia  Family History:  Family History  Problem Relation Age of Onset   Hypertension Mother    Schizophrenia Mother    Schizophrenia Sister     Social History:  Social History   Socioeconomic History   Marital status: Single    Spouse name: Not on file   Number of children: 0   Years of education: 15   Highest education level: Some college, no degree  Occupational History   Occupation: Psychologist, educational (Psychologist, forensic)  Tobacco Use   Smoking status: Former    Packs/day: 0.25    Years: 8.00    Pack years: 2.00    Types: Cigarettes    Quit date: 05/02/2019    Years since quitting: 2.1   Smokeless tobacco: Never  Vaping Use   Vaping Use: Former  Substance and Sexual Activity   Alcohol use: Yes    Comment: wine or whiskey once monthly   Drug use: Not Currently    Types: Marijuana   Sexual activity: Not on file  Other Topics Concern   Not on file  Social History Narrative   ** Merged History Encounter **       Lives with his best friend/roommate, [name]. Friend is helping out. No significant other at this time.   Social Determinants of Health   Financial Resource Strain: Not on file  Food Insecurity: Not on file  Transportation Needs: Not on file  Physical Activity: Not on file  Stress: Not on file  Social Connections: Not on file    Allergies: No Known Allergies  Metabolic Disorder Labs: Lab Results  Component Value Date   HGBA1C  5.4 10/02/2019   No results found for: PROLACTIN Lab Results  Component Value Date   CHOL 161 10/02/2019   TRIG 44 10/02/2019   HDL 48 10/02/2019   CHOLHDL 2.6 07/26/2013   VLDL 8 07/26/2013   LDLCALC 104 (H) 10/02/2019   LDLCALC 75 07/26/2013   Lab Results  Component Value Date   TSH 4.370 10/02/2019   TSH 4.457 07/26/2013    Therapeutic Level Labs: No results found for: LITHIUM Lab Results  Component Value Date   VALPROATE 19 (L) 08/02/2020   VALPROATE <10 (L) 02/24/2019   No components found for:  CBMZ  Current Medications: Current Outpatient Medications  Medication Sig Dispense Refill   amLODipine (NORVASC) 5 MG tablet Take 1 tablet (5 mg total) by mouth daily. 30 tablet 11   escitalopram (LEXAPRO) 10 MG tablet Take 1 tablet (10 mg total) by mouth daily. 30 tablet 1   lamoTRIgine (LAMICTAL) 25 MG tablet Take one tablet daily for 2 weeks then take 2 tablets daily 60 tablet 1  mometasone (NASONEX) 50 MCG/ACT nasal spray 2 sprays each nostril daily during your allergy seasons. 17 g 11   No current facility-administered medications for this visit.     Musculoskeletal: Strength & Muscle Tone: Unable to assess due to telemedicine visit Gait & Station: Unable to assess due to telemedicine visit Patient leans: Unable to assess due to telemedicine visit  Psychiatric Specialty Exam: Review of Systems  Psychiatric/Behavioral:  Positive for decreased concentration. Negative for dysphoric mood, hallucinations, self-injury, sleep disturbance and suicidal ideas. The patient is not nervous/anxious and is not hyperactive.    There were no vitals taken for this visit.There is no height or weight on file to calculate BMI.  General Appearance: Unable to assess due to telemedicine visit  Eye Contact:  Unable to assess due to telemedicine visit  Speech:  Clear and Coherent and Normal Rate  Volume:  Normal  Mood:  Euthymic  Affect:  Appropriate and Congruent  Thought Process:   Coherent, Goal Directed, and Descriptions of Associations: Intact  Orientation:  Full (Time, Place, and Person)  Thought Content: WDL   Suicidal Thoughts:  No  Homicidal Thoughts:  No  Memory:  Immediate;   Good Recent;   Good Remote;   Good  Judgement:  Fair  Insight:  Fair  Psychomotor Activity:  Normal  Concentration:  Concentration: Good and Attention Span: Good  Recall:  Good  Fund of Knowledge: Good  Language: Good  Akathisia:  Negative  Handed:  Right  AIMS (if indicated): not done  Assets:  Communication Skills Desire for Improvement Financial Resources/Insurance Housing  ADL's:  Intact  Cognition: WNL  Sleep:  Good   Screenings: AIMS    [facility]sheet [facility]  AIMS Total Score 0      AUDIT    Flowsheet Row [facility] Ad[facility] ([facility]  Alcohol Use Disorder Identification Test Final Score (AUDIT) 6 2      CAGE-AID    Flowsheet Row ED to Hosp-Admission (Discharged) from 07/20/2020 in [facility]  SURGICAL  CAGE-AID Score 2      GAD-7    Flowsheet Row Video Visit from 06/30/2021 in [facility] Counselor from 03/02/2021 in [facility] Counselor from 10/31/2020 in [facility]  Total GAD-7 Score 5 5 7       PHQ2-9    Flowsheet Row Video Visit from 06/30/2021 in [facility] Counselor from 03/02/2021 in [facility] Counselor from 10/31/2020 in [facility]  PHQ-2 Total Score 3 2 2   PHQ-9 Total Score 14 8 6       Flowsheet Row Video Visit from 06/30/2021 in [facility]mission (Discharged) [facility] ED from 02/24/2019 in [facility]  C-SSRS RISK CATEGORY No Risk No Risk No Risk        Assessment and Plan:   Charvis Lightner a 35 year old male with a past psychiatric history significant for bipolar disorder and cannabis use disorder who presents to [facility] via virtual telephone visit for follow-up and medication management.  Patient reports that he has not been taking his recent regimen of medications due to experiencing side effects in the past while on  the same medications.  Patient is hesitant with taking new medications that will manage his bipolar disorder.  Patient is interested in a medication that will help improve his focus and concentration.  Patient has a past diagnosis of ADHD.  Patient was recommended Strattera 40 mg daily for the management of his focus and concentration.  Patient is requesting a list of psychiatric meds to read over to determine which medication will best manage his bipolar disorder.  Patient was informed that all medications have a side effect profile and there is a chance that he may or may not experience the side effects associated with the medication.  Patient expresses understanding.  Patient's medications to be e-prescribed to pharmacy of choice.  1. Attention deficit hyperactivity disorder (ADHD), predominantly inattentive type  - atomoxetine (STRATTERA) 40 MG capsule; Take 1 capsule (40 mg total) by mouth daily.  Dispense: 30 capsule; Refill: 1  2. Cannabis use disorder, moderate, dependence (HCC)   3. Bipolar 1 disorder (HCC) Patient refuses to take Lamictal and Lexapro due to side effects experienced in the past  Patient to follow up in 2 months Provider spent a total of 25 minutes with the patient/reviewing the patient's chart  Meta Hatchet, PA 06/30/2021, 1:43 PM

## 2021-08-28 ENCOUNTER — Ambulatory Visit (INDEPENDENT_AMBULATORY_CARE_PROVIDER_SITE_OTHER): Payer: No Payment, Other | Admitting: Clinical

## 2021-08-28 DIAGNOSIS — F3177 Bipolar disorder, in partial remission, most recent episode mixed: Secondary | ICD-10-CM | POA: Diagnosis not present
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
## 2021-09-01 ENCOUNTER — Telehealth (INDEPENDENT_AMBULATORY_CARE_PROVIDER_SITE_OTHER): Payer: No Payment, Other | Admitting: Physician Assistant

## 2021-09-01 ENCOUNTER — Other Ambulatory Visit: Payer: Self-pay

## 2021-09-01 ENCOUNTER — Encounter (HOSPITAL_COMMUNITY): Payer: Self-pay | Admitting: Physician Assistant

## 2021-09-01 DIAGNOSIS — F3177 Bipolar disorder, in partial remission, most recent episode mixed: Secondary | ICD-10-CM | POA: Diagnosis not present

## 2021-09-01 DIAGNOSIS — G479 Sleep disorder, unspecified: Secondary | ICD-10-CM | POA: Diagnosis not present

## 2021-09-01 DIAGNOSIS — F1220 Cannabis dependence, uncomplicated: Secondary | ICD-10-CM

## 2021-09-01 DIAGNOSIS — F900 Attention-deficit hyperactivity disorder, predominantly inattentive type: Secondary | ICD-10-CM | POA: Diagnosis not present

## 2021-09-01 MED ORDER — MELATONIN 5 MG PO TABS: 5.0000 mg | ORAL_TABLET | Freq: Every evening | ORAL | 1 refills | Status: DC | PRN

## 2021-09-01 NOTE — Progress Notes (Signed)
BH MD/PA/NP OP Progress Note  Virtual Visit via Video Note  I connected with John [name] on 09/01/21 at  9:30 AM EDT by a video enabled telemedicine application and verified that I am speaking with the correct person using two identifiers.  Location: Patient: Home Provider: Clinic   I discussed the limitations of evaluation and management by telemedicine and the availability of in person appointments. The patient expressed understanding and agreed to proceed.  Follow Up Instructions:  I discussed the assessment and treatment plan with the patient. The patient was provided an opportunity to ask questions and all were answered. The patient agreed with the plan and demonstrated an understanding of the instructions.   The patient was advised to call back or seek an in-person evaluation if the symptoms worsen or if the condition fails to improve as anticipated.  I provided 24 minutes of non-face-to-face time during this encounter.  Meta Hatchet, PA   09/01/2021 11:56 AM John [name]  MRN:  [id number]  Chief Complaint: Follow up and medication management  HPI:   John [name] is a 35 year old male with a past psychiatric history significant for bipolar disorder, cannabis use disorder, and bipolar disorder who presents to [facility] via virtual video visit for follow-up and medication management.  Patient is currently being managed on the following medication: Strattera 40 mg daily.  Patient reports that he has not been taking his Strattera due to fear of potential side effects.  He reports that he feels like he needs to be on medications but is apprehensive due to past history of experiencing side effects.  Patient states that he has experienced some benefits to being on medications in the past such as feeling more awake, having improved focus, and increased creativity.  Patient reports that he has taken Risperdal and Geodon in the past.  He  states that Geodon made him feel groggy while Risperdal made the "right side of his brain wake up."  Patient has currently been having issues with his sleep and states that he has been taking Benadryl in order to sleep.  Patient has also noticed decreased energy and impaired memory.  Patient reports that he has been getting close to experiencing manic symptoms but states that he has been taking steps to prevent the mania such as exercising, eating healthy, and staying away from certain people.  Patient reports that whenever he is speaking fast he feels like he is able to remember things.  Patient states that he feels more slow as of late.  Patient remembers taking Ritalin as a kid but states that the medication made him feel groggy and caused him to overeat.  Patient endorses some anxiety.  He denies experiencing any depressive symptoms in the last 3 weeks.  Patient endorses stressors but states that most of the stressors have been good stress such as working on his personal goals.  A PHQ-9 screen was performed with the patient scoring a 13.  A GAD-7 screen was also performed with the patient scoring a 13.  Patient is alert and oriented x4, calm, cooperative, and fully engaged in conversation during the encounter.  Patient endorses okay mood.  Patient denies suicidal or homicidal ideation.  He further denies auditory or visual hallucinations and does not appear to be responding to internal/external stimuli.  Patient endorses fair sleep and receives on average 4 to 6 hours of sleep per night.  Patient endorses fair appetite and eats on average 2 meals per day.  Patient endorses alcohol consumption occasionally.  Patient endorses tobacco use but states that he has decreased his usage over the past 2 to 3 weeks.  Patient endorses illicit drug use in the form of marijuana but states that he only smokes socially.  Visit Diagnosis:    ICD-10-CM   1. Attention deficit hyperactivity disorder (ADHD), predominantly  inattentive type  F90.0     2. Bipolar 1 disorder, mixed, partial remission (HCC)  F31.77     3. Cannabis use disorder, moderate, dependence (HCC)  F12.20     4. Sleep disturbances  G47.9 melatonin 5 MG TABS      Past Psychiatric History:  Bipolar disorder versus schizophrenia, numerous psychiatry admissions, numerous presentations to the ED. has been prescribed several medications in the past including risperidone, Depakote, Lamictal, Seroquel, olanzapine.  Past Medical History:  Past Medical History:  Diagnosis Date   Bipolar 1 disorder (HCC)    Hypertension    Seasonal allergies    Thyroid disease 2017   Unknown what exactly--[place] in 2017    Past Surgical History:  Procedure Laterality Date   WISDOM TOOTH EXTRACTION      Family Psychiatric History:  Mother, sister- schizophrenia  Family History:  Family History  Problem Relation Age of Onset   Hypertension Mother    Schizophrenia Mother    Schizophrenia Sister     Social History:  Social History   Socioeconomic History   Marital status: Single    Spouse name: Not on file   Number of children: 0   Years of education: 15   Highest education level: Some college, no degree  Occupational History   Occupation: Psychologist, educational (Psychologist, forensic)  Tobacco Use   Smoking status: Former    Packs/day: 0.25    Years: 8.00    Pack years: 2.00    Types: Cigarettes    Quit date: 05/02/2019    Years since quitting: 2.3   Smokeless tobacco: Never  Vaping Use   Vaping Use: Former  Substance and Sexual Activity   Alcohol use: Yes    Comment: wine or whiskey once monthly   Drug use: Not Currently    Types: Marijuana   Sexual activity: Not on file  Other Topics Concern   Not on file  Social History Narrative   ** Merged History Encounter **       Lives with his best friend/roommate, [name]. Friend is helping out. No significant other at this time.   Social Determinants of Health   Financial Resource Strain: Not  on file  Food Insecurity: Not on file  Transportation Needs: Not on file  Physical Activity: Not on file  Stress: Not on file  Social Connections: Not on file    Allergies: No Known Allergies  Metabolic Disorder Labs: Lab Results  Component Value Date   HGBA1C 5.4 10/02/2019   No results found for: PROLACTIN Lab Results  Component Value Date   CHOL 161 10/02/2019   TRIG 44 10/02/2019   HDL 48 10/02/2019   CHOLHDL 2.6 07/26/2013   VLDL 8 07/26/2013   LDLCALC 104 (H) 10/02/2019   LDLCALC 75 07/26/2013   Lab Results  Component Value Date   TSH 4.370 10/02/2019   TSH 4.457 07/26/2013    Therapeutic Level Labs: No results found for: LITHIUM Lab Results  Component Value Date   VALPROATE 19 (L) 08/02/2020   VALPROATE <10 (L) 02/24/2019   No components found for:  CBMZ  Current Medications: Current Outpatient Medications  Medication Sig  Dispense Refill   melatonin 5 MG TABS Take 1 tablet (5 mg total) by mouth at bedtime as needed. 30 tablet 1   amLODipine (NORVASC) 5 MG tablet Take 1 tablet (5 mg total) by mouth daily. 30 tablet 11   atomoxetine (STRATTERA) 40 MG capsule Take 1 capsule (40 mg total) by mouth daily. 30 capsule 1   escitalopram (LEXAPRO) 10 MG tablet Take 1 tablet (10 mg total) by mouth daily. 30 tablet 1   lamoTRIgine (LAMICTAL) 25 MG tablet Take one tablet daily for 2 weeks then take 2 tablets daily 60 tablet 1   mometasone (NASONEX) 50 MCG/ACT nasal spray 2 sprays each nostril daily during your allergy seasons. 17 g 11   No current facility-administered medications for this visit.     Musculoskeletal: Strength & Muscle Tone: Unable to due  Gait & Station:  Patient leans:   Psychiatric Specialty Exam: Review of Systems  Psychiatric/Behavioral:  Positive for decreased concentration and sleep disturbance. Negative for dysphoric mood, hallucinations, self-injury and suicidal ideas. The patient is nervous/anxious. The patient is not hyperactive.     There were no vitals taken for this visit.There is no height or weight on file to calculate BMI.  General Appearance: Well Groomed  Eye Contact:  Good  Speech:  Clear and Coherent and Normal Rate  Volume:  Normal  Mood:  Anxious and Depressed  Affect:  Congruent  Thought Process:  Coherent, Goal Directed, and Descriptions of Associations: Intact  Orientation:  Full (Time, Place, and Person)  Thought Content: WDL   Suicidal Thoughts:  No  Homicidal Thoughts:  No  Memory:  Immediate;   Good Recent;   Good Remote;   Good  Judgement:  Good  Insight:  Fair  Psychomotor Activity:  Normal  Concentration:  Concentration: Good and Attention Span: Good  Recall:  Good  Fund of Knowledge: Good  Language: Good  Akathisia:  NA  Handed:  Right  AIMS (if indicated): not done  Assets:  Communication Skills Desire for Improvement Financial Resources/Insurance Housing  ADL's:  Intact  Cognition: WNL  Sleep:  Fair   Screenings: AIMS    Flowsheet Row [facility]  AIMS Total Score 0      AUDIT    Flowsheet Row [facility]) [facility]  Alcohol Use Disorder Identification Test Final Score (AUDIT) 6 2      CAGE-AID    Flowsheet Row ED to Hosp-Admission (Discharged) from 07/20/2020 in [facility]  SURGICAL  CAGE-AID Score 2      GAD-7    Flowsheet Row Video Visit from 09/01/2021 in [facility] Video Visit from 06/30/2021 in [facility] Counselor from 03/02/2021 in [facility] Counselor from 10/31/2020 in [facility]  Total GAD-7 Score 13 5 5 7       PHQ2-9    Flowsheet Row Video Visit from 09/01/2021 in [facility] Video Visit from 06/30/2021 in [facility] Counselor from 03/02/2021 in [facility] Counselor from 10/31/2020 in [facility]  PHQ-2 Total Score 2 3 2 2   PHQ-9 Total Score 13 14 8 6       Flowsheet Row Video Visit from 09/01/2021 in [facility]  Video Visit from 06/30/2021 in [facility] (Discharged[facility]  C-SSRS RISK CATEGORY No Risk No Risk No Risk        Assessment and Plan:   John [name] is a 35 year old male with a past psychiatric history significant for bipolar disorder, cannabis use disorder, and bipolar disorder who presents to [facility] via virtual video visit for follow-up and medication management.  Patient reports that he has not been taking his Strattera due to fear of side effects.  Patient notes that he has been experiencing issues with his sleep and decreased energy.  Patient states that he has engaged in exercise, is eating healthy, and staying away from negative people in order to prevent himself from becoming manic.  Patient has taken melatonin in the past and is interested in being placed on the medication for the management of his sleep.  Patient was prescribed melatonin 5 mg at bedtime for the management of his sleep.  Patient's medication to be e-prescribed to pharmacy of choice.  1. Attention deficit hyperactivity disorder (ADHD), predominantly inattentive type Patient reports that he has not been taking his Strattera due to being apprehensive over side effects he may experience while on the medication  2. Bipolar 1 disorder, mixed, partial remission (HCC)   3. Cannabis use disorder, moderate, dependence (HCC) Endorses using marijuana sparingly Marijuana cessation was provided during the  encounter  4. Sleep disturbances  - melatonin 5 MG TABS; Take 1 tablet (5 mg total) by mouth at bedtime as needed.  Dispense: 30 tablet; Refill: 1  Provider spent a total of 24 minutes with the patient/reviewing patient's chart  Meta Hatchet, PA 09/01/2021, 11:56 AM

## 2021-10-13 ENCOUNTER — Telehealth: Payer: Self-pay

## 2021-10-13 NOTE — Telephone Encounter (Signed)
Can see outside as an illness

## 2021-10-13 NOTE — Telephone Encounter (Signed)
Pt called to report fever, dizzy, itchy throat, bumps on back of throat which started 2 days ago. Pt has been taking cough medicine and tylenol. Believes it may be a bacteria infection. Would like appt or Rx

## 2021-10-15 ENCOUNTER — Encounter: Payer: Self-pay | Admitting: Internal Medicine

## 2021-10-15 ENCOUNTER — Ambulatory Visit (INDEPENDENT_AMBULATORY_CARE_PROVIDER_SITE_OTHER): Payer: Self-pay | Admitting: Internal Medicine

## 2021-10-15 VITALS — BP 134/88 | HR 72 | Resp 12 | Ht 76.0 in | Wt 214.0 lb

## 2021-10-15 DIAGNOSIS — A084 Viral intestinal infection, unspecified: Secondary | ICD-10-CM

## 2021-10-15 DIAGNOSIS — Z202 Contact with and (suspected) exposure to infections with a predominantly sexual mode of transmission: Secondary | ICD-10-CM

## 2021-10-15 DIAGNOSIS — R369 Urethral discharge, unspecified: Secondary | ICD-10-CM

## 2021-10-15 MED ORDER — DOXYCYCLINE HYCLATE 100 MG PO TABS: ORAL_TABLET | ORAL | 0 refills | Status: DC

## 2021-10-15 NOTE — Telephone Encounter (Signed)
Was seen 10/15/21

## 2021-10-15 NOTE — Progress Notes (Signed)
    Subjective:    Patient ID: John [name], male   DOB: [birth year], 35 y.o.   MRN: [id number]   HPI  Sore throat, fever, nausea, diarrhea, no vomiting and cough starting 5 days ago.  Went to the [facility] yesterday and tested negative for COVID and strep.  Feeling a bit better today, though throat still irritated.    Male partner notified him she has Chlamydia about 1 week ago.  They were together about 1.5 weeks ago.  He started with symptoms about the same time she notified him.  He is having penile drainage--not sure if a color--seems clear, and some burning.   Denies rashes.    Current Meds  Medication Sig   amLODipine (NORVASC) 5 MG tablet Take 1 tablet (5 mg total) by mouth daily.   melatonin 5 MG TABS Take 1 tablet (5 mg total) by mouth at bedtime as needed.   mometasone (NASONEX) 50 MCG/ACT nasal spray 2 sprays each nostril daily during your allergy seasons.   No Known Allergies   Review of Systems    Objective:   BP 134/88 (BP Location: Left Arm, Patient Position: Sitting, Cuff Size: Normal)   Pulse 72   Resp 12   Ht 6\' 4"  (1.93 m)   Wt 214 lb (97.1 kg)   BMI 26.05 kg/m   Physical Exam NAD HEENT:  PERRL, EOMI, TMs pearly gray, Throat with minimal to no injection.  No exudate Neck:  Supple, No adenopathy Chest:  CTA CV:  RRR without murmur or rub.   Abd:  S, NT, No HSM or mass, + BS GU:  circumcised, scant clear moisture at urethral orifice.  No erythema.  NT on palpation of penile shaft.  No testicular tenderness, mass or erythema.   Urethral swab obtained.   Assessment & Plan    Chlamydia exposure 1.5 weeks ago  with symptoms for about 1 week himself.  Doxycycline 100 mg twice daily for 7 days.   Check GC, HIV, RPR.  Call if no improvement in symptoms beginning of next week.  No sexual intercourse until completing treatment and other labs clear.  Schedule for test of cure in 3 months--cancel if negative.  2.  Viral URI/gastroenteritis:   supportive care.  Appears well today.

## 2021-10-16 LAB — RPR QUALITATIVE: RPR Ser Ql: NONREACTIVE

## 2021-10-16 LAB — HIV ANTIBODY (ROUTINE TESTING W REFLEX): HIV Screen 4th Generation wRfx: NONREACTIVE

## 2021-10-17 LAB — GC/CHLAMYDIA PROBE AMP
Chlamydia trachomatis, NAA: POSITIVE — AB
Neisseria Gonorrhoeae by PCR: NEGATIVE

## 2021-10-21 ENCOUNTER — Ambulatory Visit (INDEPENDENT_AMBULATORY_CARE_PROVIDER_SITE_OTHER): Payer: No Payment, Other | Admitting: Clinical

## 2021-10-21 DIAGNOSIS — F900 Attention-deficit hyperactivity disorder, predominantly inattentive type: Secondary | ICD-10-CM

## 2021-10-21 DIAGNOSIS — F3177 Bipolar disorder, in partial remission, most recent episode mixed: Secondary | ICD-10-CM | POA: Diagnosis not present
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
## 2021-10-22 ENCOUNTER — Other Ambulatory Visit: Payer: Self-pay | Admitting: Internal Medicine

## 2021-11-03 ENCOUNTER — Telehealth (HOSPITAL_COMMUNITY): Payer: No Payment, Other | Admitting: Physician Assistant

## 2021-11-03 ENCOUNTER — Other Ambulatory Visit: Payer: Self-pay

## 2021-12-08 ENCOUNTER — Other Ambulatory Visit: Payer: Self-pay | Admitting: Internal Medicine

## 2021-12-10 ENCOUNTER — Telehealth: Payer: Self-pay

## 2021-12-10 NOTE — Telephone Encounter (Signed)
Pt would like refill on doxycycline. He describes not feeling that chlamydia has cleared. Has mild symptoms, but was unable to describe what those symptoms were.

## 2021-12-11 NOTE — Telephone Encounter (Signed)
Needs appt

## 2021-12-16 ENCOUNTER — Ambulatory Visit (HOSPITAL_COMMUNITY): Payer: No Payment, Other | Admitting: Clinical

## 2021-12-22 ENCOUNTER — Ambulatory Visit (INDEPENDENT_AMBULATORY_CARE_PROVIDER_SITE_OTHER): Payer: Self-pay | Admitting: Internal Medicine

## 2021-12-22 ENCOUNTER — Other Ambulatory Visit: Payer: Self-pay

## 2021-12-22 ENCOUNTER — Encounter: Payer: Self-pay | Admitting: Internal Medicine

## 2021-12-22 VITALS — BP 140/110 | HR 72 | Resp 20 | Ht 76.0 in | Wt 214.0 lb

## 2021-12-22 DIAGNOSIS — F319 Bipolar disorder, unspecified: Secondary | ICD-10-CM

## 2021-12-22 DIAGNOSIS — A749 Chlamydial infection, unspecified: Secondary | ICD-10-CM

## 2021-12-22 DIAGNOSIS — I10 Essential (primary) hypertension: Secondary | ICD-10-CM

## 2021-12-22 MED ORDER — DOXYCYCLINE HYCLATE 100 MG PO TABS: ORAL_TABLET | ORAL | 0 refills | Status: DC
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
## 2021-12-23 NOTE — Telephone Encounter (Signed)
Seen by dr mulberry °

## 2022-01-05 ENCOUNTER — Ambulatory Visit: Payer: Self-pay

## 2022-01-05 ENCOUNTER — Other Ambulatory Visit: Payer: Self-pay

## 2022-01-05 VITALS — BP 160/118 | HR 64

## 2022-01-05 DIAGNOSIS — Z0130 Encounter for examination of blood pressure without abnormal findings: Secondary | ICD-10-CM

## 2022-01-05 NOTE — Progress Notes (Signed)
Patient reported that he started the medication 01/04/2022 because he was not able to pick up medication until then. Will be back in a week to repeat blood pressure check.

## 2022-01-11 ENCOUNTER — Ambulatory Visit: Payer: Self-pay

## 2022-01-11 ENCOUNTER — Other Ambulatory Visit: Payer: Self-pay

## 2022-01-11 VITALS — BP 130/84 | HR 68

## 2022-01-11 DIAGNOSIS — Z0130 Encounter for examination of blood pressure without abnormal findings: Secondary | ICD-10-CM

## 2022-01-11 NOTE — Progress Notes (Signed)
Patient has been taking bp medication consistently without missing any days. Does not have any questions or concerns.  Dr Delrae Alfred notified of blood pressure, no changes to medication

## 2022-01-27 ENCOUNTER — Other Ambulatory Visit: Payer: Self-pay

## 2022-01-27 ENCOUNTER — Ambulatory Visit (INDEPENDENT_AMBULATORY_CARE_PROVIDER_SITE_OTHER): Payer: No Payment, Other | Admitting: Clinical

## 2022-01-27 DIAGNOSIS — F3177 Bipolar disorder, in partial remission, most recent episode mixed: Secondary | ICD-10-CM | POA: Diagnosis not present

## 2022-01-30 NOTE — Plan of Care (Signed)
Client was in agreement to the treatment plan. °

## 2022-01-30 NOTE — Progress Notes (Signed)
THERAPIST PROGRESS NOTE  Session Time: 45 minutes  Participation Level: Active  Behavioral Response: CasualAlertEuthymic  Type of Therapy: Individual Therapy  Treatment Goals addressed: identify at least 1 self destructive behavior or consequence of engaging in self destructive behavior   ProgressTowards Goals: Progressing  Interventions: CBT and Supportive  Summary:  John [name] is a 36 y.o. male who presents for the scheduled session oriented x5, appropriately dressed, and friendly.  Client denied hallucinations and delusions.  Client reported on today he is doing well.  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  Client reported he has noticed that with him working there he makes efforts to make note of things that he thinks could be improved at the clinic.  Client reported he has presented his ideas to his manager about how some things could be done in a more sequenced pattern.  Client reported one of the things that he has noticed about his new workplace which is common in many is an effective communication.  Client reported he often hears colleagues discussing topics or making statements that he does not necessarily agree with.  Client reported remind him of his previous job and how he handled people talking to him negatively or treating him negatively in which she chose to black things off as opposed to engaging in verbal arguments which could turn physical.  Client engaged with a therapist to discuss effective conflict regulation and communication skills.  Client discussed over time his ability to take a step back and consider the choices he makes before speaking or reacting to someone.  Client reported overall interest to continue working on improving his physical wellbeing which would include improving his eating habits and incorporating vitamins.  Client reported he feels like he has been maintaining well without  medication but wants to continue staying mindful of his choices as he works towards his goals to maintain stability.  Client reported he is feeling coordination [facility] to help locate independent housing.   Suicidal/Homicidal: Nowithout intent/plan  Therapist Response:  Therapist began the appointment asking the client how he has been doing since last seen. Therapist used CBT to engage using active listening and positive emotional support selective thoughts and feelings. Therapist used CBT asked the client to identify his progress with maintaining at his new place of employment. Therapist used CBT to have the client identify any challenges if her identifiable that he is encountered. Therapist used CBT to engage client and discuss communication skills, conflict resolution skills, and identifying the pros and cons of choices. Therapist used CBT is asked the client about what his continued goals are for himself. Therapist assigned client homework to practice impulse control by wearing out the pros and cons of his response and similar challenging situations before responding. Client was scheduled for next appointment.    Plan: return in 4 weeks  Diagnosis: Bipolar 1 disorder, mixed, partial remission   Collaboration of Care: Referral or follow-up with counselor/therapist AEB [facility]  Patient/Guardian was advised Release of Information must be obtained prior to any record release in order to collaborate their care with an outside provider. Patient/Guardian was advised if they have not already done so to contact the registration department to sign all necessary forms in order for [place] to release information regarding their care.   Consent: Patient/Guardian gives verbal consent for treatment and assignment of benefits for services provided during this visit. Patient/Guardian expressed understanding and agreed to  proceed.   John [name] John Damaso, LCSW 01/27/2022

## 2022-02-17 ENCOUNTER — Other Ambulatory Visit: Payer: Self-pay

## 2022-02-17 ENCOUNTER — Ambulatory Visit (INDEPENDENT_AMBULATORY_CARE_PROVIDER_SITE_OTHER): Payer: No Payment, Other | Admitting: Clinical

## 2022-02-17 DIAGNOSIS — F3177 Bipolar disorder, in partial remission, most recent episode mixed: Secondary | ICD-10-CM

## 2022-02-20 NOTE — Plan of Care (Signed)
Client is agreement to the plan. ?

## 2022-02-20 NOTE — Progress Notes (Signed)
? ?  THERAPIST PROGRESS NOTE ? ?Session Time: 45 minutes ? ?Participation Level: Active ? ?Behavioral Response: CasualAlertEuthymic ? ?Type of Therapy: Individual Therapy ? ?Treatment Goals addressed: identify cognitive patterns and beliefs that interfere with therapy progress once per session ? ?ProgressTowards Goals: Progressing ? ?Interventions: CBT ? ?Summary:  ?John [name] is a 36 y.o. male who presents as a walk in for a therapy appointment. Client presented oriented times five, appropriately dressed, and friendly. Client denied hallucinations and delusions. ?Client reported on today he is doing fairly well but has experienced some changes with his employment. 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 Client reported his boss made a claim that he was not getting along with the other employees. Client reported he is not upset about losing the job and is feel optimistic about looking for work that interests him. Client reported also his sister has gone missing. Client reported his sister is diagnosed with schizophrenia and no one in his family has heard from her in approx. 3 weeks. Client reported she has done this before and usually comes back on her own but they are now discussing filing a missing person report. Client reported he and his family have supportive of one another during this time especially.  ?Evidence of progress towards goal:  Client reported his boundaries are improving by stating," I know how to and when to remove myself from situations as it warrants in his daily life. Client reported at least 3x per week he engages in physical exercise and engagement with friends which helps him feel balanced.   ? ? ?Suicidal/Homicidal: Nowithout intent/plan ? ?Therapist Response:  ?Therapist began the appointment asking the client how he has been doing since last seen. ?Therapist used CBT to engage using active listening and positive emotional support. ?Therapist used CBT to engage and  ask the client to identify changes in occupational and family relationships that negatively affected his mood. ?Therapist used CBT to normalize the clients emotional response to stressors. ?Therapist used CBT ask the client to identify his progress with frequency of use with coping skills with continued practice in his daily activity.    ?Therapist assigned the client homework to brainstorm ideal jobs he might like. ?Client was scheduled for next appointment. ? ? ? ?Plan: Return again in 5 weeks. ? ?Diagnosis: bipolar 1 disorder, mixed, partial remission ? ?Collaboration of Care: Patient refused AEB none requested by the client ? ?Patient/Guardian was advised Release of Information must be obtained prior to any record release in order to collaborate their care with an outside provider. Patient/Guardian was advised if they have not already done so to contact the registration department to sign all necessary forms in order for [place] to release information regarding their care.  ? ?Consent: Patient/Guardian gives verbal consent for treatment and assignment of benefits for services provided during this visit. Patient/Guardian expressed understanding and agreed to proceed.  ? ?Neena Rhymes Lalla Laham, LCSW ?02/17/2022 ? ?

## 2022-02-25 ENCOUNTER — Ambulatory Visit (INDEPENDENT_AMBULATORY_CARE_PROVIDER_SITE_OTHER): Payer: No Payment, Other | Admitting: Clinical

## 2022-02-25 DIAGNOSIS — F3177 Bipolar disorder, in partial remission, most recent episode mixed: Secondary | ICD-10-CM | POA: Diagnosis not present

## 2022-03-03 ENCOUNTER — Other Ambulatory Visit: Payer: Self-pay

## 2022-03-17 ENCOUNTER — Ambulatory Visit (INDEPENDENT_AMBULATORY_CARE_PROVIDER_SITE_OTHER): Payer: Self-pay | Admitting: Psychiatry

## 2022-03-17 DIAGNOSIS — F900 Attention-deficit hyperactivity disorder, predominantly inattentive type: Secondary | ICD-10-CM

## 2022-03-17 MED ORDER — ATOMOXETINE HCL 40 MG PO CAPS: 40.0000 mg | ORAL_CAPSULE | Freq: Every day | ORAL | 1 refills | Status: DC

## 2022-03-17 NOTE — Progress Notes (Signed)
BH MD/PA/NP OP Progress Note ? ?03/17/2022 9:16 AM ?John Gravelavid [name]  ?MRN:  [id number] ? ?Virtual Visit via Video Note ? ?I connected with John Gravelavid [name] on 03/17/22 at  8:30 AM EDT by a video enabled telemedicine application and verified that I am speaking with the correct person using two identifiers. ? ?Location: ?Patient: home ?Provider: offsite ?  ?I discussed the limitations of evaluation and management by telemedicine and the availability of in person appointments. The patient expressed understanding and agreed to proceed. ? ?  ?I discussed the assessment and treatment plan with the patient. The patient was provided an opportunity to ask questions and all were answered. The patient agreed with the plan and demonstrated an understanding of the instructions. ?  ?The patient was advised to call back or seek an in-person evaluation if the symptoms worsen or if the condition fails to improve as anticipated. ? ?I provided 40 minutes of non-face-to-face time during this encounter. ? ? ?Mcneil Sobericely Yaser Harvill, NP  ? ?Chief Complaint: Medication management ? ?HPI: John [name] is a 36 year old [facility] follow-up evaluation.  Patient has a psychiatric history of bipolar disorder, ADHD and cannabis use disorder.  Patient symptoms are managed with Lexapro 10 mg daily, Lamictal 25 mg, melatonin 5 mg at bedtime as needed for sleep and Strattera 40 mg daily.  Patient reports that he is not taking any of his medications, and he only takes a Theanine supplement.  Patient reports a decrease in focus, stating that his thoughts are too fast to keep up with.  Patient is open to restarting Strattera 40 mg daily.  Strattera 40 mg ordered. ?Patient is alert and oriented x4, calm, pleasant and willing to engage.  He reports a good mood, appetite and sleep cycle.  Patient denies suicidal or homicidal ideations, paranoia, delusional thoughts, auditory or visual  hallucinations. ? ? ? ?Visit Diagnosis:  ?  ICD-10-CM   ?1. Attention deficit hyperactivity disorder (ADHD), predominantly inattentive type  F90.0 atomoxetine (STRATTERA) 40 MG capsule  ?  ? ? ?Past Psychiatric History:  ? ?Past Medical History:  ?Past Medical History:  ?Diagnosis Date  ? Bipolar 1 disorder (HCC)   ? Hypertension   ? Seasonal allergies   ? Thyroid disease 2017  ? Unknown what exactly--[place] in 2017  ?  ?Past Surgical History:  ?Procedure Laterality Date  ? WISDOM TOOTH EXTRACTION    ? ? ?Family Psychiatric History: See below what is the ? ?Family History:  ?Family History  ?Problem Relation Age of Onset  ? Hypertension Mother   ? Schizophrenia Mother   ? Schizophrenia Sister   ? ? ?Social History:  ?Social History  ? ?Socioeconomic History  ? Marital status: Single  ?  Spouse name: Not on file  ? Number of children: 0  ? Years of education: 8015  ? Highest education level: Some college, no degree  ?Occupational History  ? Occupation: Psychologist, educationaloldering (Psychologist, forensicbattery production)  ?Tobacco Use  ? Smoking status: Former  ?  Packs/day: 0.25  ?  Years: 8.00  ?  Pack years: 2.00  ?  Types: Cigarettes  ?  Quit date: 05/02/2019  ?  Years since quitting: 2.8  ? Smokeless tobacco: Never  ?Vaping Use  ? Vaping Use: Former  ?Substance and Sexual Activity  ? Alcohol use: Yes  ?  Comment: wine or whiskey once monthly  ? Drug use: Not Currently  ?  Types: Marijuana  ? Sexual activity: Not  on file  ?Other Topics Concern  ? Not on file  ?Social History Narrative  ? ** Merged History Encounter **  ?    ? Lives with his best friend/roommate, Fransico Michael. ?Friend is helping out. ?No significant other at this time.  ? ?Social Determinants of Health  ? ?Financial Resource Strain: Not on file  ?Food Insecurity: Not on file  ?Transportation Needs: Not on file  ?Physical Activity: Not on file  ?Stress: Not on file  ?Social Connections: Not on file  ? ? ?Allergies: No Known Allergies ? ?Metabolic Disorder Labs: ?Lab Results  ?Component Value  Date  ? HGBA1C 5.4 10/02/2019  ? ?No results found for: PROLACTIN ?Lab Results  ?Component Value Date  ? CHOL 161 10/02/2019  ? TRIG 44 10/02/2019  ? HDL 48 10/02/2019  ? CHOLHDL 2.6 07/26/2013  ? VLDL 8 07/26/2013  ? LDLCALC 104 (H) 10/02/2019  ? LDLCALC 75 07/26/2013  ? ?Lab Results  ?Component Value Date  ? TSH 4.370 10/02/2019  ? TSH 4.457 07/26/2013  ? ? ?Therapeutic Level Labs: ?No results found for: LITHIUM ?Lab Results  ?Component Value Date  ? VALPROATE 19 (L) 08/02/2020  ? VALPROATE <10 (L) 02/24/2019  ? ?No components found for:  CBMZ ? ?Current Medications: ?Current Outpatient Medications  ?Medication Sig Dispense Refill  ? amLODipine (NORVASC) 5 MG tablet Take 1 tablet by mouth once daily 30 tablet 11  ? atomoxetine (STRATTERA) 40 MG capsule Take 1 capsule (40 mg total) by mouth daily. 30 capsule 1  ? doxycycline (VIBRA-TABS) 100 MG tablet 1 tab by mouth twice daily for 7 days. 14 tablet 0  ? escitalopram (LEXAPRO) 10 MG tablet Take 1 tablet (10 mg total) by mouth daily. (Patient not taking: Reported on 10/15/2021) 30 tablet 1  ? lamoTRIgine (LAMICTAL) 25 MG tablet Take one tablet daily for 2 weeks then take 2 tablets daily (Patient not taking: Reported on 10/15/2021) 60 tablet 1  ? loratadine (CLARITIN) 10 MG tablet Take 10 mg by mouth as needed for allergies.    ? melatonin 5 MG TABS Take 1 tablet (5 mg total) by mouth at bedtime as needed. 30 tablet 1  ? mometasone (NASONEX) 50 MCG/ACT nasal spray 2 sprays each nostril daily during your allergy seasons. 17 g 11  ? Multiple Vitamin (MULTI-VITAMIN DAILY PO) Take by mouth.    ? THEANINE PO Take 200 mg by mouth.    ? ?No current facility-administered medications for this visit.  ? ? ? ?Musculoskeletal: ?Strength & Muscle Tone: N/A virtual visit ?Gait & Station: N/A ?Patient leans: N/A ? ?Psychiatric Specialty Exam: ?Review of Systems  ?Psychiatric/Behavioral:  Positive for decreased concentration. Negative for hallucinations, self-injury and suicidal  ideas.   ?All other systems reviewed and are negative.  ?There were no vitals taken for this visit.There is no height or weight on file to calculate BMI.  ?General Appearance: Well Groomed  ?Eye Contact:  Good  ?Speech:  Clear and Coherent  ?Volume:  Normal  ?Mood:  Euthymic  ?Affect:  Congruent  ?Thought Process:  Goal Directed  ?Orientation:  Full (Time, Place, and Person)  ?Thought Content: Logical   ?Suicidal Thoughts:  No  ?Homicidal Thoughts:  No  ?Memory: Good  ?Judgement:  Good  ?Insight:  Good  ?Psychomotor Activity:  NA  ?Concentration: Good  ?Recall:  Good  ?Fund of Knowledge: Good  ?Language: Good  ?Akathisia:  NA  ?Handed:  Right  ?AIMS (if indicated): not done  ?Assets:  Communication Skills ?  Desire for Improvement  ?ADL's:  Intact  ?Cognition: WNL  ?Sleep:  Good  ? ?Screenings: ?AIMS   ? ?Flowsheet Row [facility]  ?AIMS Total Score 0  ? ?  ? ?AUDIT   ? ?Flowsheet Row [facility] Ad[facility] ([facility]  ?Alcohol Use Disorder Identification Test Final Score (AUDIT) 6 2  ? ?  ? ?CAGE-AID   ? ?Flowsheet Row ED to Hosp-Admission (Discharged) from 07/20/2020 in [facility]  ?CAGE-AID Score 2  ? ?  ? ?GAD-7   ? ?Flowsheet Row Counselor from 10/21/2021 in [facility] Video Visit from 09/01/2021 in [facility] Video Visit from 06/30/2021 in [facility] Counselor from 03/02/2021 in [facility] Counselor from 10/31/2020 in [facility]  ?Total GAD-7 Score 8 13 5 5 7   ? ?  ? ?PHQ2-9   ? ?Flowsheet Row Counselor from 10/21/2021 in [facility] Video Visit from 09/01/2021 in [facility] Video Visit from 06/30/2021 in [facility] Counselor from [facility]02/20[facility] in [facility] Counselor from 10/31/2020 in [facility]  ?PHQ-2 To

## 2022-03-21 ENCOUNTER — Encounter (HOSPITAL_COMMUNITY): Payer: Self-pay

## 2022-03-21 NOTE — Progress Notes (Signed)
? ?THERAPIST PROGRESS NOTE ?Virtual Visit via Telephone Note ? ?I connected with John [name] on 02/25/2022 at  4:00 PM EDT by telephone and verified that I am speaking with the correct person using two identifiers. ? ?Location: ?Patient: home ?Provider: office ?  ?I discussed the limitations, risks, security and privacy concerns of performing an evaluation and management service by telephone and the availability of in person appointments. I also discussed with the patient that there may be a patient responsible charge related to this service. The patient expressed understanding and agreed to proceed. ? ? ?Follow Up Instructions: ?I discussed the assessment and treatment plan with the patient. The patient was provided an opportunity to ask questions and all were answered. The patient agreed with the plan and demonstrated an understanding of the instructions. ?  ?The patient was advised to call back or seek an in-person evaluation if the symptoms worsen or if the condition fails to improve as anticipated. ? ? ? ?Session Time: 40 minutes ? ?Participation Level: Active ? ?Behavioral Response: CasualAlertEuthymic ? ?Type of Therapy: Individual Therapy ? ?Treatment Goals addressed: Identify cognitive patterns and believes that interfere with therapy once per session ? ?ProgressTowards Goals: Progressing ? ?Interventions: CBT and Supportive ? ?Summary:  ?John [name] is a 36 y.o. male who presents for the scheduled session oriented x5, appropriately dressed, and friendly.  Client denied hallucinations and delusions. ?Client reported on today he is doing pretty well.  Client reported since last seen they were able to locate his sister.  Client reported she went to jail in another city on charges that they are not sure what they are.  Client reported they are working to coordinate to get her sister back into services so she can continue to get stabilized.  Client reported his biological mother is also in the hospital on  life support.  Client reported he has been contemplating whether or not he can go visit her in person.  Client reported in between looking for another job he has a lot of time on his hands.  Client reported he is trying to keep structure in his day-to-day activity even without work.  Client reported over the years he has been a "benefit" of bipolar disorder is the mania helps him to get things done but he has to keep his emotions in check.  Client reported throughout therapy over the years he has learned a lot of tools but his problem has been utilizing them in a way that is beneficial to his day-to-day activity. ?Evidence of progress towards goal: Client reported he is self aware of 1 and barrier that has delayed his progress which is now using the tools that he has been taught to help with mental health stability and productivity in day-to-day life. ? ? ?Suicidal/Homicidal: Nowithout intent/plan ? ?Therapist Response:  ?Therapist began the appointment asking the client how he has been doing since last seen. ?Therapist used CBT to utilize active listening and positive emotional support towards his thoughts and feelings. ?Therapist used CBT to ask client about update on family stressors. ?Therapist used CBT to engage and asked the client about his thought processing during current life transitions with family and employment. ?Therapist used CBT to reinforce the clients self-awareness about previous taught skills about time management, activity scheduling and problem solving skills. ?Therapist used CBT ask the client to identify his progress with frequency of use with coping skills with continued practice in his daily activity.    ?Therapist assigned the client homework to  practice weekly activity scheduling and practicing self care. ?Client was scheduled for next appointment. ? ? ?Plan: Return again in 5 weeks. ? ?Diagnosis: bipolar 1 disorder, mixed, partial remission ? ?Collaboration of Care: Patient refused AEB  none requested by the client at this time. ? ?Patient/Guardian was advised Release of Information must be obtained prior to any record release in order to collaborate their care with an outside provider. Patient/Guardian was advised if they have not already done so to contact the registration department to sign all necessary forms in order for [place] to release information regarding their care.  ? ?Consent: Patient/Guardian gives verbal consent for treatment and assignment of benefits for services provided during this visit. Patient/Guardian expressed understanding and agreed to proceed.  ? ?Birdena Jubilee Shyra Emile, LCSW ?02/25/2022 ? ?

## 2022-03-21 NOTE — Plan of Care (Signed)
Client is in agreement with the plan. ?

## 2022-03-23 ENCOUNTER — Ambulatory Visit (INDEPENDENT_AMBULATORY_CARE_PROVIDER_SITE_OTHER): Payer: No Payment, Other | Admitting: Clinical

## 2022-03-23 DIAGNOSIS — F3177 Bipolar disorder, in partial remission, most recent episode mixed: Secondary | ICD-10-CM
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
## 2022-03-24 NOTE — Progress Notes (Signed)
?THERAPIST PROGRESS NOTE ?Virtual Visit via Video Note ? ?I connected with John [name] on 03/23/2022 at  3:00 PM EDT by a video enabled telemedicine application and verified that I am speaking with the correct person using two identifiers. ? ?Location: ?Patient: home ?Provider: office ?  ?I discussed the limitations of evaluation and management by telemedicine and the availability of in person appointments. The patient expressed understanding and agreed to proceed. ? ? ?Follow Up Instructions: ?I discussed the assessment and treatment plan with the patient. The patient was provided an opportunity to ask questions and all were answered. The patient agreed with the plan and demonstrated an understanding of the instructions. ?  ?The patient was advised to call back or seek an in-person evaluation if the symptoms worsen or if the condition fails to improve as anticipated. ? ? ? ?Session Time: 40 minutes ? ?Participation Level: Active ? ?Behavioral Response: NAAlertEuthymic ? ?Type of Therapy: Individual Therapy ? ?Treatment Goals addressed: client will identify cognitive patterns and beliefs that interfere with therapy once per session ? ?ProgressTowards Goals: Progressing ? ?Interventions: CBT and Supportive ? ?Summary:  ?John [name] is a 36 y.o. male who presents for the scheduled session oriented x5 and friendly.  Client denied hallucinations and delusions. ?Client reported on today he is doing well.  Client reported he has been doing well and he has been trying to keep his time occupied while he is looking for new employment.  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  Client reported an update on his sister's that she is still currently incarcerated and he and his family are trying to figure out how to split the bill money for her.  Client reported there is still some surrounding circumstances that are unclear about her times of release.  Client reported approximately 2 weeks ago he  had a panic attack which she has not had in a very long time.  Client reported he thinks he underestimated how much he was overwhelmed by the stressors in his family.  Client reported his mother is also in the hospital.  Client reported he is not sure if he will go visit her yet but he is going to speak with one of his brother soon.  Client reported he has learned a lot about himself during this time without work.  Client reported he has noticed that he is the kind of person that craves of routine.  Client reported when he wakes up every day with no sense of direction it tends to frustrate him.  Client reported he has been spending time by working out and reading about topics of interest such as forming good habits.  Client reported he has also been using this time to reconnect with his family.  Client reported therapist pros and cons of dealing with his family.  Client reported he often has to play the mediator and it becomes frustrating because he wants to be able to go to them to be able to figure things out sometimes.  Client reported he spoke with the psychiatrist about trying medications.  Client reported he is taking a natural supplement to help with focus and energy and stress.  Client reported he will meet with a psychiatrist again for trying a medication for ADHD.  Client reported otherwise he has been sleeping and eating well.Evidence of progress towards goal:  client reported he has identified 1 strength to help him create stability which is his value in having a daily routine. Client reported  he doe better with structure.  ? ? ? ?Suicidal/Homicidal: Nowithout intent/plan ? ?Therapist Response:  ?Therapist began the appointment asking the client how he has been doing since last seen. ?Therapist used CBT to utilize active listening and positive emotional support towards his thoughts and feelings. ?Therapist used CBT to engage and asked the client about his daily routine during transition of occupational  settings. ?Therapist used CBT to ask the client about his improvement with family/ interpersonal relationships and how he problem solves conflicts. ?Therapist used CBT to engage with the client to identify his strengths while balancing his own, family, and occupational challenges. ?Therapist used CBT ask the client to identify his progress with frequency of use with coping skills with continued practice in his daily activity.   ?Therapist assigned the client homework to create and implement a daily routine that includes physical activity for at least 6 out of 7 days per week.  ?Client was scheduled for next appointment. ? ? ?Plan: Return again in 5 weeks. ? ?Diagnosis: bipolar 1 disorder, mixed ,partial remission ? ?Collaboration of Care: Patient refused AEB none requested by the client at this time. ? ?Patient/Guardian was advised Release of Information must be obtained prior to any record release in order to collaborate their care with an outside provider. Patient/Guardian was advised if they have not already done so to contact the registration department to sign all necessary forms in order for [place] to release information regarding their care.  ? ?Consent: Patient/Guardian gives verbal consent for treatment and assignment of benefits for services provided during this visit. Patient/Guardian expressed understanding and agreed to proceed.  ? ?Neena Rhymes John Higashi, LCSW ?03/23/2022 ? ?
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
## 2022-04-13 ENCOUNTER — Ambulatory Visit (INDEPENDENT_AMBULATORY_CARE_PROVIDER_SITE_OTHER): Payer: No Payment, Other | Admitting: Clinical

## 2022-04-13 DIAGNOSIS — F3177 Bipolar disorder, in partial remission, most recent episode mixed: Secondary | ICD-10-CM | POA: Diagnosis not present

## 2022-04-14 NOTE — Progress Notes (Signed)
? ?  THERAPIST PROGRESS NOTE ? ?Session Time: 45 minutes ? ?Participation Level: Active ? ?Behavioral Response: CasualAlertEuthymic ? ?Type of Therapy: Individual Therapy ? ?Treatment Goals addressed: Client will identify cognitive patterns and believes that interfere with therapy once per session ? ?ProgressTowards Goals: Progressing ? ?Interventions: CBT and Supportive ? ?Summary:  ?John [name] is a 36 y.o. male who presents for the scheduled session oriented times five, appropriately dressed, and friendly. Client denied hallucinations and delusions. ?Client reported on today he is doing well. Client reported he has been experiencing random emotions. Client reported it has been tolerable and pertaining to things he has experienced from past memories during his childhood. Client reported past Sunday there was a gathering at his adoptive mothers home. Client reported his siblings brought up a negative comment associated with memories. Client reported they called him the "bad child" but his mother said he was her least problematic. Client reported they never understood since they were adopted he was in "protective mode". Client reported instead of being concerned about what he wanted to be when he grew up he was concerned about everything being okay such as "are we good". Client reported he has been thinking about separating himself from his family again. Client reported he did it once before for 7 years. Client reported their behaviors cause him to feel negatively. Client reported he has noticed that he gets frustrated because he focuses on the bigger picture and not the smaller steps he needs to take to reach them. ?Evidence of progress towards goal: Client reported 1 evidence of improved cognitive thought processing last week he identifies he is frustrated by focusing on the bigger picture instead of identifying small goals that he needs to work on to accomplish his goals in life. ? ? ? ?Suicidal/Homicidal:  Nowithout intent/plan ? ?Therapist Response:  ?Therapist began the appointment asking client how he has been doing since last seen. ?Therapist used CBT to engage using active listening and positive emotional support towards his thoughts and feelings. ?Therapist used CBT to engage and ask client open-ended questions about the source of his range of emotions over the past week. ?Therapist used CBT to engage and actively listened to the client as he describes childhood experiences that have contributed to his thought processing and behaviors into adult years. ?Therapist used CBT to discuss identifying smaller steps that can be taken that work towards 1 school. ?Therapist used CBT ask the client to identify his progress with frequency of use with coping skills with continued practice in his daily activity.    ?Therapist assigned client homework to identify smaller steps that he can take to help him improve occupational goals and other needs that he may identify. ?Client was scheduled for next appointment. ? ? ? ?Plan: Return again in 5 weeks. ? ?Diagnosis: Bipolar 1 disorder, mixed, partial remission ? ?Collaboration of Care: Patient refused AEB none identified by the client. ? ?Patient/Guardian was advised Release of Information must be obtained prior to any record release in order to collaborate their care with an outside provider. Patient/Guardian was advised if they have not already done so to contact the registration department to sign all necessary forms in order for [place] to release information regarding their care.  ? ?Consent: Patient/Guardian gives verbal consent for treatment and assignment of benefits for services provided during this visit. Patient/Guardian expressed understanding and agreed to proceed.  ? ?Birdena Jubilee Cristen Murcia, LCSW ?04/13/2022 ? ?

## 2022-04-17 NOTE — Plan of Care (Signed)
?  Problem: Bipolar Disorder CCP Problem  1  Goal: STG: [place] WILL IDENTIFY COGNITIVE PATTERNS AND BELIEFS THAT INTERFERE WITH THERAPY ONCE PER SESSION Outcome: Progressing   

## 2022-04-21 ENCOUNTER — Ambulatory Visit: Payer: Self-pay | Admitting: Internal Medicine

## 2022-05-27 ENCOUNTER — Other Ambulatory Visit: Payer: Self-pay

## 2022-05-27 ENCOUNTER — Ambulatory Visit: Payer: Self-pay | Admitting: Internal Medicine

## 2022-05-27 MED ORDER — AMLODIPINE BESYLATE 5 MG PO TABS: 5.0000 mg | ORAL_TABLET | Freq: Every day | ORAL | 6 refills | Status: DC

## 2022-06-09 ENCOUNTER — Ambulatory Visit (INDEPENDENT_AMBULATORY_CARE_PROVIDER_SITE_OTHER): Payer: No Payment, Other | Admitting: Clinical

## 2022-06-09 ENCOUNTER — Encounter (HOSPITAL_COMMUNITY): Payer: Self-pay | Admitting: Psychiatry

## 2022-06-09 ENCOUNTER — Ambulatory Visit (INDEPENDENT_AMBULATORY_CARE_PROVIDER_SITE_OTHER): Payer: No Payment, Other | Admitting: Psychiatry

## 2022-06-09 VITALS — BP 139/96 | HR 63 | Ht 77.0 in | Wt 210.0 lb

## 2022-06-09 DIAGNOSIS — F900 Attention-deficit hyperactivity disorder, predominantly inattentive type: Secondary | ICD-10-CM

## 2022-06-09 DIAGNOSIS — F3177 Bipolar disorder, in partial remission, most recent episode mixed: Secondary | ICD-10-CM | POA: Diagnosis not present

## 2022-06-09 NOTE — Progress Notes (Signed)
BH MD/PA/NP OP Progress Note  06/09/2022 9:39 AM John [name]  MRN:  [id number]  Chief Complaint: " It has been refreshing to be able to be angry or sad" Chief Complaint  Patient presents with   Medication Management   HPI: 36 year old male seen today for follow-up psychiatric evaluation.  He has a psychiatric history of bipolar 1, ADHD, and cannabis use.  He is currently managed on Strattera 40 mg daily.  He informed Clinical research associate that he is never tried Theatre manager as she was scared of the side effects.  Patient informed Clinical research associate that he takes  L-tyrosine  500 mg and L-Theanine 200 mg daily to help manage his mental health conditions.  Today he was well-groomed, pleasant, cooperative, engaged in conversation, maintained eye contact.  He informed Clinical research associate that since being off of psychiatric medications its been refreshing to be able to express emotions like anger and sadness.  He informed Clinical research associate that his mood has been stable and notes that he has minimal anxiety and depression.  Provider conducted a GAD-7 and patient scored a 2.  Provider also conducted a PHQ-9 and patient scored a 6.  He notes that he sleeps 6 or less hours but reports that he is able to cope with it.  He endorses having an adequate appetite.  Today he denies SI/HI/VH, mania, paranoia  Patient informed writer that at times he has difficulty concentrating.  He reports being distracted, forgetful, and at times unable to focus on mentally taxing task.  Patient was prescribed Ritalin by Dr. Quintella [name] in 2021 but notes that he did not take it due to having a concussion.  He has recently been prescribed Strattera but notes that he fears taking it because of side effects.  Patient notes that he may consider it in the near future.    Patient reports that for the last 3 weeks he has been living in his own apartment with provided by [facility].  He informed Clinical research associate that he is getting used to this new environment but is glad to have a place of his own.  He  also informed Clinical research associate that he is in the process of looking for a job.  Patient informed Clinical research associate that he liked how he felt mentally while on Risperdal as it helps slow down his thoughts and made him more creative.  He however notes that at this time he does not want to restart psychiatric medications. He reports that he takes L-tyrosine L-Theanine and  and reports that they are somewhat effective in managing his anxiety, depression, and ADHD.  Patient prescribed Strattera 40 mg and notes that he will consider starting this in the near future as he reports that he will be going back to school to study finances..  At this time patient notes that he is not in need of refills. Visit Diagnosis:    ICD-10-CM   1. Attention deficit hyperactivity disorder (ADHD), predominantly inattentive type  F90.0       Past Psychiatric History: Bipolar disorder versus schizophrenia, numerous psychiatry admissions, numerous presentations to the ED. has been prescribed several medications in the past including risperidone, Depakote, Lamictal, Seroquel, olanzapine.  Past Medical History:  Past Medical History:  Diagnosis Date   Bipolar 1 disorder (HCC)    Hypertension    Seasonal allergies    Thyroid disease 2017   Unknown what exactly--[place] in 2017    Past Surgical History:  Procedure Laterality Date   WISDOM TOOTH EXTRACTION      Family Psychiatric History:  Mother, sister- schizophrenia  Family History:  Family History  Problem Relation Age of Onset   Hypertension Mother    Schizophrenia Mother    Schizophrenia Sister     Social History:  Social History   Socioeconomic History   Marital status: Single    Spouse name: Not on file   Number of children: 0   Years of education: 15   Highest education level: Some college, no degree  Occupational History   Occupation: Psychologist, educational (Psychologist, forensic)  Tobacco Use   Smoking status: Former    Packs/day: 0.25    Years: 8.00    Total pack years: 2.00     Types: Cigarettes    Quit date: 05/02/2019    Years since quitting: 3.1   Smokeless tobacco: Never  Vaping Use   Vaping Use: Former  Substance and Sexual Activity   Alcohol use: Yes    Comment: wine or whiskey once monthly   Drug use: Not Currently    Types: Marijuana   Sexual activity: Not on file  Other Topics Concern   Not on file  Social History Narrative   ** Merged History Encounter **       Lives with his best friend/roommate, [name]. Friend is helping out. No significant other at this time.   Social Determinants of Health   Financial Resource Strain: Not on file  Food Insecurity: Not on file  Transportation Needs: No Transportation Needs (10/02/2019)   PRAPARE - Administrator, Civil Service (Medical): No    Lack of Transportation (Non-Medical): No  Physical Activity: Sufficiently Active (10/02/2019)   Exercise Vital Sign    Days of Exercise per Week: 7 days    Minutes of Exercise per Session: 120 min  Stress: No Stress Concern Present (10/02/2019)   [employer] of Occupational Health - Occupational Stress Questionnaire    Feeling of Stress : Not at all  Social Connections: Not on file    Allergies: No Known Allergies  Metabolic Disorder Labs: Lab Results  Component Value Date   HGBA1C 5.4 10/02/2019   No results found for: "PROLACTIN" Lab Results  Component Value Date   CHOL 161 10/02/2019   TRIG 44 10/02/2019   HDL 48 10/02/2019   CHOLHDL 2.6 07/26/2013   VLDL 8 07/26/2013   LDLCALC 104 (H) 10/02/2019   LDLCALC 75 07/26/2013   Lab Results  Component Value Date   TSH 4.370 10/02/2019   TSH 4.457 07/26/2013    Therapeutic Level Labs: No results found for: "LITHIUM" Lab Results  Component Value Date   VALPROATE 19 (L) 08/02/2020   VALPROATE <10 (L) 02/24/2019   No results found for: "CBMZ"  Current Medications: Current Outpatient Medications  Medication Sig Dispense Refill   amLODipine (NORVASC) 5 MG tablet Take 1  tablet (5 mg total) by mouth daily. 30 tablet 6   atomoxetine (STRATTERA) 40 MG capsule Take 1 capsule (40 mg total) by mouth daily. 30 capsule 1   doxycycline (VIBRA-TABS) 100 MG tablet 1 tab by mouth twice daily for 7 days. 14 tablet 0   escitalopram (LEXAPRO) 10 MG tablet Take 1 tablet (10 mg total) by mouth daily. (Patient not taking: Reported on 10/15/2021) 30 tablet 1   lamoTRIgine (LAMICTAL) 25 MG tablet Take one tablet daily for 2 weeks then take 2 tablets daily (Patient not taking: Reported on 10/15/2021) 60 tablet 1   loratadine (CLARITIN) 10 MG tablet Take 10 mg by mouth as needed for allergies.  melatonin 5 MG TABS Take 1 tablet (5 mg total) by mouth at bedtime as needed. 30 tablet 1   mometasone (NASONEX) 50 MCG/ACT nasal spray 2 sprays each nostril daily during your allergy seasons. 17 g 11   Multiple Vitamin (MULTI-VITAMIN DAILY PO) Take by mouth.     THEANINE PO Take 200 mg by mouth.     No current facility-administered medications for this visit.     Musculoskeletal: Strength & Muscle Tone: within normal limits Gait & Station: normal Patient leans: N/A  Psychiatric Specialty Exam: Review of Systems  Blood pressure (!) 139/96, pulse 63, height 6\' 5"  (1.956 m), weight 210 lb (95.3 kg), SpO2 100 %.Body mass index is 24.9 kg/m.  General Appearance: Well Groomed  Eye Contact:  Good  Speech:  Clear and Coherent and Normal Rate  Volume:  Normal  Mood:  Anxious and Depressed  Affect:  Appropriate and Congruent  Thought Process:  Coherent, Goal Directed, and Linear  Orientation:  Full (Time, Place, and Person)  Thought Content: WDL and Logical   Suicidal Thoughts:  No  Homicidal Thoughts:  No  Memory:  Immediate;   Good Recent;   Good Remote;   Good  Judgement:  Good  Insight:  Good  Psychomotor Activity:  Normal  Concentration:  Concentration: Good and Attention Span: Good  Recall:  Good  Fund of Knowledge: Good  Language: Good  Akathisia:  No  Handed:  Right   AIMS (if indicated): not done  Assets:  Communication Skills Desire for Improvement Housing Leisure Time Physical Health Social Support  ADL's:  Intact  Cognition: WNL  Sleep:  Fair   Screenings: AIMS    Flowsheet Row [facility]  AIMS Total Score 0      AUDIT    Flowsheet Row [facility]) [facility] ([facility]  Alcohol Use Disorder Identification Test Final Score (AUDIT) 6 2      CAGE-AID    Flowsheet Row ED to Hosp-Admission (Discharged) from 07/20/2020 in [facility]  SURGICAL  CAGE-AID Score 2      GAD-7    Flowsheet Row Clinical Support from 06/09/2022 in [facility] Counselor from 10/21/2021 in [facility] Video Visit from 09/01/2021 in [facility] Video Visit from 06/30/2021 in [facility] Counselor from 03/02/2021 in [facility]  Total GAD-7 Score 2 8 13 5 5       PHQ2-9    Flowsheet Row Clinical Support from 06/09/2022 in [facility] Counselor from 10/21/2021 in [facility] Video Visit from 09/01/2021 in [facility] Video Visit from 06/30/2021 in [facility] Counselor from 03/02/2021 in [facility]  PHQ-2 Total Score 0 2 2 3 2   PHQ-9 Total Score 6 6 13 14 8       Flowsheet Row Video Visit from 09/01/2021 in [facility] Video Visit from 06/30/2021 in [facility] (Discharged[facility]  C-SSRS RISK CATEGORY No Risk No Risk No Risk         Assessment and Plan: Patient informed that his anxiety, mood, and sleep are well managed.  At times she notes  that his ADHD is bothersome.  He reports that he takes L L-Theanine and  and reports that they are somewhat effective in managing his anxiety, depression, and ADHD.  Patient prescribed Strattera 40 mg and notes that he will consider starting this in the near future.  At this time patient notes that he is not in need of refills.  1. Attention deficit hyperactivity disorder (ADHD), predominantly inattentive type    Collaboration of Care: Collaboration of Care: Other provider involved in patient's care AEB counselor  Patient/Guardian was advised Release of Information must be obtained prior to any record release in order to collaborate their care with an outside provider. Patient/Guardian was advised if they have not already done so to contact the registration department to sign all necessary forms in order for [place] to release information regarding their care.   Consent: Patient/Guardian gives verbal consent for treatment and assignment of benefits for services provided during this visit. Patient/Guardian expressed understanding and agreed to proceed.   Follow-up in 3 months Follow-up with therapy  Shanna Cisco, NP 06/09/2022, 9:39 AM

## 2022-06-09 NOTE — Progress Notes (Signed)
THERAPIST PROGRESS NOTE  Session Time: 45 minutes  Participation Level: Active  Behavioral Response: CasualAlertEuthymic  Type of Therapy: Individual Therapy  Treatment Goals addressed: Client will identify cognitive patterns and believes that interfere with therapy once per session  ProgressTowards Goals: Progressing  Interventions: CBT and Supportive  Summary:  John [name] is a 36 y.o. male who presents for a walk-in therapy appointment.  Client presented oriented x5, appropriately dressed, and friendly.  Client denied hallucinations and delusions. Client reported on today he is doing fairly well.  Client reported he is continuing to help his sister figure out her situation.  Client reported she is out of jail in the hospital but her ongoing mental health symptoms pose a challenge and he does not feel like she stayed in the hospital for as long as she needed to.  Client reported aside from his sister reconnecting with his family has shown to have some challenges but also a lot of positives.  Client reported he is able to spend time with his nieces and nephews which has been nice.  Client reported he has 1 sister who has children whom she will not be able to keep.  Client reported he has been thinking about what if he took over guardianship of her children and if that is something that he feels he is ready for or not.  Client reported otherwise he has been contemplating about making a trip to visit his biological mother who he found out is now out of the hospital.  Client reported she has not really spoken to anyone in the family about her current medical condition.  Client reported that would also be a challenge for him about visit because he has not seen her since he was 36 years old.  Client reported while he is looking for a job he is also enjoyed having the freedom to spend his time organizing and getting moved into his apartment how he wants. Evidence of progress towards goal: Client  reported 1 positive which placed seeing the psychiatrist recently to begin a medication regimen.  Client reported 1 skill also of using problem solving and analyzing pros and cons before making decisions.   Suicidal/Homicidal: Nowithout intent/plan  Therapist Response:  Therapist began the appointment asking the client how he has been doing since last seen. Therapist used CBT to engage using active listening and positive emotional support towards her thoughts and feelings. Therapist used CBT to ask the client about challenges that have occurred since his last appointment and how he is handling them. Therapist used CBT to ask the client to identify his thought processing emotions during his challenges in interpersonal relationships. Therapist used CBT to positively reinforce his pre-existing skills being used as well as identifying boundaries. Therapist used CBT ask the client to identify her progress with frequency of use with coping skills with continued practice in her daily activity.    Therapist assigned client homework to practice self-care.    Plan: Return again in 5 weeks.  Diagnosis: Bipolar 1 disorder, mixed, partial remission  Collaboration of Care: Patient refused AEB none  Patient/Guardian was advised Release of Information must be obtained prior to any record release in order to collaborate their care with an outside provider. Patient/Guardian was advised if they have not already done so to contact the registration department to sign all necessary forms in order for [place] to release information regarding their care.   Consent: Patient/Guardian gives verbal consent for treatment and assignment of benefits for services  provided during this visit. Patient/Guardian expressed understanding and agreed to proceed.   Neena Rhymes Naythen Heikkila, LCSW 06/09/2022

## 2022-06-12 NOTE — Plan of Care (Signed)
  Problem: Bipolar Disorder CCP Problem  1  Goal: STG: Sameer WILL IDENTIFY COGNITIVE PATTERNS AND BELIEFS THAT INTERFERE WITH THERAPY ONCE PER SESSION Outcome: Progressing   

## 2022-06-30 ENCOUNTER — Ambulatory Visit: Payer: Self-pay | Admitting: Internal Medicine

## 2022-07-13 ENCOUNTER — Encounter (HOSPITAL_COMMUNITY): Payer: Self-pay

## 2022-07-13 ENCOUNTER — Ambulatory Visit (HOSPITAL_COMMUNITY): Payer: No Payment, Other | Admitting: Clinical

## 2022-08-16 DIAGNOSIS — A749 Chlamydial infection, unspecified: Secondary | ICD-10-CM | POA: Insufficient documentation

## 2022-08-18 ENCOUNTER — Encounter: Payer: Self-pay | Admitting: Internal Medicine

## 2022-08-18 ENCOUNTER — Ambulatory Visit: Payer: Self-pay | Admitting: Internal Medicine

## 2022-08-18 VITALS — BP 132/86 | HR 72 | Ht 76.0 in | Wt 215.0 lb

## 2022-08-18 DIAGNOSIS — Z79899 Other long term (current) drug therapy: Secondary | ICD-10-CM

## 2022-08-18 DIAGNOSIS — F319 Bipolar disorder, unspecified: Secondary | ICD-10-CM

## 2022-08-18 DIAGNOSIS — Z5948 Other specified lack of adequate food: Secondary | ICD-10-CM

## 2022-08-18 DIAGNOSIS — I10 Essential (primary) hypertension: Secondary | ICD-10-CM

## 2022-08-18 DIAGNOSIS — A749 Chlamydial infection, unspecified: Secondary | ICD-10-CM

## 2022-08-18 DIAGNOSIS — Z13220 Encounter for screening for lipoid disorders: Secondary | ICD-10-CM

## 2022-08-18 DIAGNOSIS — Z8619 Personal history of other infectious and parasitic diseases: Secondary | ICD-10-CM

## 2022-08-18 MED ORDER — AMLODIPINE BESYLATE 5 MG PO TABS: 5.0000 mg | ORAL_TABLET | Freq: Every day | ORAL | 11 refills | Status: DC

## 2022-08-18 NOTE — Progress Notes (Signed)
    Subjective:    Patient ID: John [name], male   DOB: 12-02-1986, 36 y.o.   MRN: [id number]   HPI    Hypertension:  Control fair.  He states he has missed his amlodipine twice in past week as had some alcoholic beverages.  Later states was completely off meds for 2 weeks just 2 weeks ago.  Discussed should never miss his BP meds.  Discussed taking amlodipine in the morning instead of evening when he has more of a schedule.  Working as Investment banker, operational at [employer].    2.  Bipolar 1 Disorder and ADHD:  missed last visit beginning of August.  Seems to feel he is controlling BPD and ADHD with supplements instead now.  He plans to get back to counselor,but states there was a change in the system and when he realized he had appt, was too late to get there and has not reappointed.  Was counseling wih Paige Cozart.  Wants to take Carnitine  3.  Food access issues:  discussed options to get him set up with support.    4.  History of Chlamydia:  States he completed Doxy end of last year.  Has not had recheck to confirm clearance.   Current Meds  Medication Sig   amLODipine (NORVASC) 5 MG tablet Take 1 tablet (5 mg total) by mouth daily.   Ascorbic Acid (VITAMIN C) 1000 MG tablet Take 1,000 mg by mouth daily.   B Complex Vitamins (VITAMIN B COMPLEX) CAPS Take 250 capsules by mouth daily.   mometasone (NASONEX) 50 MCG/ACT nasal spray 2 sprays each nostril daily during your allergy seasons.   Multiple Vitamin (MULTI-VITAMIN DAILY PO) Take by mouth.   Omega-3 Fatty Acids (FISH OIL) 1000 MG CAPS Take by mouth.   THEANINE PO Take 1,000 mg by mouth. Every other day   Tyrosine 500 MG TABS Take 1 tablet by mouth every other day. Every other day   No Known Allergies   Review of Systems    Objective:   BP 132/86 (BP Location: Left Arm, Patient Position: Sitting, Cuff Size: Normal)   Pulse 72   Ht 6\' 4"  (1.93 m)   Wt 215 lb (97.5 kg)   BMI 26.17 kg/m   Physical Exam NAD HEENT:  PERRL, EOMI, TMs  pearly gray, throat without injection.   Neck:  Supple, No adenopathy, no thyromegaly Chest:  CTA CV:  RRR with normal S1 and S2, No S3, S4 or murmur.  Carotid, radial and DP pulses normal and equal. Abd:  S, NT, No HSM or mass, + BS LE:  No edema.     Assessment & Plan    Hypertension:  refilled Amlodipine 5 mg daily.  Fair control.  Encouraged him to never miss his medication.CBC, CMP.  2.  History of chlamydia:  Urine for repeat testing GC/Chlamydia.  3.  Food insecurity:  referral to SW, to work with him on support.  Check to see if other needs.  4.  Bipolar Disorder and ADHD:  Urged him to get back with counseling and medication.  Get sense he is in denial of bipolar disorder in particular.    5.  Screen for hypercholesterolemia:  FLP

## 2022-08-19 LAB — CBC WITH DIFFERENTIAL/PLATELET
Basophils Absolute: 0 10*3/uL (ref 0.0–0.2)
Basos: 0 %
EOS (ABSOLUTE): 0.1 10*3/uL (ref 0.0–0.4)
Eos: 1 %
Hematocrit: 43.9 % (ref 37.5–51.0)
Hemoglobin: 14.8 g/dL (ref 13.0–17.7)
Immature Grans (Abs): 0 10*3/uL (ref 0.0–0.1)
Immature Granulocytes: 0 %
Lymphocytes Absolute: 1.7 10*3/uL (ref 0.7–3.1)
Lymphs: 26 %
MCH: 30.1 pg (ref 26.6–33.0)
MCHC: 33.7 g/dL (ref 31.5–35.7)
MCV: 89 fL (ref 79–97)
Monocytes Absolute: 0.4 10*3/uL (ref 0.1–0.9)
Monocytes: 6 %
Neutrophils Absolute: 4.4 10*3/uL (ref 1.4–7.0)
Neutrophils: 67 %
Platelets: 279 10*3/uL (ref 150–450)
RBC: 4.92 x10E6/uL (ref 4.14–5.80)
RDW: 13.3 % (ref 11.6–15.4)
WBC: 6.7 10*3/uL (ref 3.4–10.8)

## 2022-08-19 LAB — COMPREHENSIVE METABOLIC PANEL
ALT: 18 IU/L (ref 0–44)
AST: 23 IU/L (ref 0–40)
Albumin/Globulin Ratio: 1.6 (ref 1.2–2.2)
Albumin: 4.9 g/dL (ref 4.1–5.1)
Alkaline Phosphatase: 90 IU/L (ref 44–121)
BUN/Creatinine Ratio: 15 (ref 9–20)
BUN: 16 mg/dL (ref 6–20)
Bilirubin Total: 0.3 mg/dL (ref 0.0–1.2)
CO2: 26 mmol/L (ref 20–29)
Calcium: 10.2 mg/dL (ref 8.7–10.2)
Chloride: 101 mmol/L (ref 96–106)
Creatinine, Ser: 1.1 mg/dL (ref 0.76–1.27)
Globulin, Total: 3.1 g/dL (ref 1.5–4.5)
Glucose: 80 mg/dL (ref 70–99)
Potassium: 4.4 mmol/L (ref 3.5–5.2)
Sodium: 141 mmol/L (ref 134–144)
Total Protein: 8 g/dL (ref 6.0–8.5)
eGFR: 90 mL/min/{1.73_m2} (ref >59)

## 2022-08-19 LAB — LIPID PANEL W/O CHOL/HDL RATIO
Cholesterol, Total: 130 mg/dL (ref 100–199)
HDL: 44 mg/dL (ref >39)
LDL Chol Calc (NIH): 75 mg/dL (ref 0–99)
Triglycerides: 51 mg/dL (ref 0–149)
VLDL Cholesterol Cal: 11 mg/dL (ref 5–40)

## 2022-08-20 LAB — GC/CHLAMYDIA PROBE AMP
Chlamydia trachomatis, NAA: POSITIVE — AB
Neisseria Gonorrhoeae by PCR: NEGATIVE

## 2022-09-03 MED ORDER — DOXYCYCLINE HYCLATE 100 MG PO TABS: ORAL_TABLET | ORAL | 0 refills | Status: DC

## 2022-09-06 ENCOUNTER — Ambulatory Visit: Payer: Self-pay | Admitting: Internal Medicine

## 2022-09-07 NOTE — Progress Notes (Unsigned)
Social worker provided resources to food pantries and other resources for access to healthy food.  Patient will call to make a therapy appointment based on schedule and need.

## 2022-09-09 ENCOUNTER — Encounter (HOSPITAL_COMMUNITY): Payer: No Payment, Other | Admitting: Physician Assistant

## 2022-09-15 ENCOUNTER — Ambulatory Visit (INDEPENDENT_AMBULATORY_CARE_PROVIDER_SITE_OTHER): Payer: No Payment, Other | Admitting: Clinical

## 2022-09-15 DIAGNOSIS — F3177 Bipolar disorder, in partial remission, most recent episode mixed: Secondary | ICD-10-CM | POA: Diagnosis not present

## 2022-09-15 NOTE — Progress Notes (Signed)
   THERAPIST PROGRESS NOTE  Session Time: 45 minutes  Participation Level: Active  Behavioral Response: CasualAlertEuthymic  Type of Therapy: Individual Therapy  Treatment Goals addressed: Client will identify cognitive patterns and believes that interfere with therapy once per session  ProgressTowards Goals: Progressing  Interventions: CBT and Supportive  Summary:  John [name] is a 36 y.o. male who presents for walk-in appointment.  Client presented oriented x5, appropriately dressed, and friendly.  Client denied hallucinations delusions. Client reported on today he is doing well.  Client reported he wants to come in by walk-in appointments to attend therapy due to having a new job and it working better that way with his schedule.  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  Client reported being at work a try to culture themselves to be.  Client reported otherwise he has been staying in communication with his biological mother and helping to navigate her needs.  Client reported he foresees that he will have to travel back and forth to [place] more often in the near future.  Client reported he also reconnected with a biologic client who wants to better acquainted him with his family history.  Client reported he tries to get things from the perspective of it happening around him and that to him.  Client reported he has been figuring out ways to help him cope with his ADHD symptoms such as planning out his downtime and exercising helps with his focus. Evidence of progress towards goal: Client reported 1 positive reframing statement that helps to regulate his emotions.  Client reported using 2 techniques to help manage his symptoms of ADHD.  Suicidal/Homicidal: Nowithout intent/plan  Therapist Response:  Therapist began the appointment asking  client how his been doing since last seen. Therapist used CBT to engage using active listening and positive emotional support. Therapist used CBT to ask the client how he is coping with changes to employment and his family dynamic. Therapist used CBT to allow the client time to discuss his thoughts and emotions regarding his stressors. Therapist used CBT to discuss problem-solving skills and maintaining boundaries. Therapist used CBT ask the client to identify his progress with frequency of use with coping skills with continued practice in his daily activity. Therapist assigned to client homework to practice self-care.    Plan: Client reported he will return to therapy on a walk-in basis to better accommodate his work schedule.  Diagnosis: Bipolar 1 disorder, mixed, partial remission  Collaboration of Care: Patient refused AEB no other needs requested by the client at this time  Patient/Guardian was advised Release of Information must be obtained prior to any record release in order to collaborate their care with an outside provider. Patient/Guardian was advised if they have not already done so to contact the registration department to sign all necessary forms in order for [place] to release information regarding their care.   Consent: Patient/Guardian gives verbal consent for treatment and assignment of benefits for services provided during this visit. Patient/Guardian expressed understanding and agreed to proceed.   [place], LCSW 09/15/2022

## 2022-09-18 NOTE — Plan of Care (Signed)
  Problem: Bipolar Disorder CCP Problem  1  Goal: STG: [place] WILL IDENTIFY COGNITIVE PATTERNS AND BELIEFS THAT INTERFERE WITH THERAPY ONCE PER SESSION Outcome: Progressing

## 2022-11-24 ENCOUNTER — Ambulatory Visit (INDEPENDENT_AMBULATORY_CARE_PROVIDER_SITE_OTHER): Payer: No Payment, Other | Admitting: Clinical

## 2022-11-24 DIAGNOSIS — F3177 Bipolar disorder, in partial remission, most recent episode mixed: Secondary | ICD-10-CM | POA: Diagnosis not present

## 2022-11-24 NOTE — Progress Notes (Signed)
   THERAPIST PROGRESS NOTE  Session Time: 45 minutes  Participation Level: Active  Behavioral Response: CasualAlertEuthymic  Type of Therapy: Individual Therapy  Treatment Goals addressed: client will identify cognitive patterns and beliefs that interfere with therapy once per session  ProgressTowards Goals: Progressing  Interventions: CBT  Summary:  John [name] is a 37 y.o. male who presents as a walk in for therapy. Client presents oriented times five, appropriately dressed, and friendly. Client denied hallucinations and delusions. Client reported he is doing fairly okay today. Client reported he ongoing has the responsibility to figure out his mothers next step for care. Client reported she is currently in a rehabilitation/nursing facility. Client reported she needs 24 hour care or close to it. Client reported he enjoys his work at [employer] but has his challenges with his coworkers. Client reported they make comments about him that give him the impression they are intimidated by how well he does. Client reported he finds himself doing tasks that people in higher positions should be doing. Client reported he tries to view it as new skills to improve his resume for future opportunity. Client reported he has been using the gym as a therapeutic activity to help with things that bother him as well as writing things down during the week. Client reported it helps to drain him so he has a slower pace and work and it helps him to sleep much better. Client reported with his ADHD symptoms he can be very hyperactive at work so utilizing energy in the gym helps to slow down his pace throughout the day. Evidence of progress towards goal:  client reported using physical exercise at least 5 out of 7 days per week.   Suicidal/Homicidal: Nowithout intent/plan  Therapist Response:  Therapist began the appointment asking the client how he has been doing. Therapist used CBT to engage using  active listening and positive emotional support. Therapist used CBT to give the client time to discuss his stressors with employment and social interactions. Therapist used CBT to normalize the clients emotional response. Therapist used CBT ask the client to identify his progress with frequency of use with coping skills with continued practice in his daily activity.    Therapist assigned the client homework of self care.   Plan: Return again in 3 weeks.  Diagnosis: bipolar 1 disorder, mixed, partial remission  Collaboration of Care: Patient refused AEB none requested by the client.  Patient/Guardian was advised Release of Information must be obtained prior to any record release in order to collaborate their care with an outside provider. Patient/Guardian was advised if they have not already done so to contact the registration department to sign all necessary forms in order for [place] to release information regarding their care.   Consent: Patient/Guardian gives verbal consent for treatment and assignment of benefits for services provided during this visit. Patient/Guardian expressed understanding and agreed to proceed.   Neena Rhymes Labradford Schnitker, LCSW 11/24/2022

## 2023-02-01 ENCOUNTER — Emergency Department (HOSPITAL_COMMUNITY): Payer: Self-pay

## 2023-02-01 ENCOUNTER — Encounter (HOSPITAL_COMMUNITY): Payer: Self-pay | Admitting: Emergency Medicine

## 2023-02-01 ENCOUNTER — Emergency Department (HOSPITAL_COMMUNITY)
Admission: EM | Admit: 2023-02-01 | Discharge: 2023-02-02 | Disposition: A | Discharge status: Home / Self Care | Payer: Self-pay | Attending: Emergency Medicine | Admitting: Emergency Medicine

## 2023-02-01 ENCOUNTER — Other Ambulatory Visit: Payer: Self-pay

## 2023-02-01 ENCOUNTER — Encounter (HOSPITAL_COMMUNITY): Payer: Self-pay

## 2023-02-01 ENCOUNTER — Ambulatory Visit (INDEPENDENT_AMBULATORY_CARE_PROVIDER_SITE_OTHER): Payer: Self-pay

## 2023-02-01 ENCOUNTER — Ambulatory Visit (HOSPITAL_COMMUNITY)
Admission: EM | Admit: 2023-02-01 | Discharge: 2023-02-01 | Disposition: A | Discharge status: Home / Self Care | Payer: Self-pay | Attending: Physician Assistant | Admitting: Physician Assistant

## 2023-02-01 DIAGNOSIS — E876 Hypokalemia: Secondary | ICD-10-CM | POA: Insufficient documentation

## 2023-02-01 DIAGNOSIS — R0602 Shortness of breath: Secondary | ICD-10-CM | POA: Insufficient documentation

## 2023-02-01 DIAGNOSIS — I10 Essential (primary) hypertension: Secondary | ICD-10-CM | POA: Insufficient documentation

## 2023-02-01 DIAGNOSIS — R079 Chest pain, unspecified: Secondary | ICD-10-CM

## 2023-02-01 DIAGNOSIS — M25512 Pain in left shoulder: Secondary | ICD-10-CM | POA: Insufficient documentation

## 2023-02-01 DIAGNOSIS — R0781 Pleurodynia: Secondary | ICD-10-CM

## 2023-02-01 DIAGNOSIS — Z1152 Encounter for screening for COVID-19: Secondary | ICD-10-CM | POA: Insufficient documentation

## 2023-02-01 DIAGNOSIS — R0789 Other chest pain: Secondary | ICD-10-CM | POA: Insufficient documentation

## 2023-02-01 DIAGNOSIS — Z79899 Other long term (current) drug therapy: Secondary | ICD-10-CM | POA: Insufficient documentation

## 2023-02-01 LAB — COMPREHENSIVE METABOLIC PANEL
ALT: 19 U/L (ref 0–44)
AST: 24 U/L (ref 15–41)
Albumin: 4.2 g/dL (ref 3.5–5.0)
Alkaline Phosphatase: 68 U/L (ref 38–126)
Anion gap: 11 (ref 5–15)
BUN: 21 mg/dL — ABNORMAL HIGH (ref 6–20)
CO2: 26 mmol/L (ref 22–32)
Calcium: 9.8 mg/dL (ref 8.9–10.3)
Chloride: 98 mmol/L (ref 98–111)
Creatinine, Ser: 1.13 mg/dL (ref 0.61–1.24)
GFR, Estimated: >60 mL/min (ref >60)
Glucose, Bld: 91 mg/dL (ref 70–99)
Potassium: 3.4 mmol/L — ABNORMAL LOW (ref 3.5–5.1)
Sodium: 135 mmol/L (ref 135–145)
Total Bilirubin: 0.4 mg/dL (ref 0.3–1.2)
Total Protein: 7.5 g/dL (ref 6.5–8.1)

## 2023-02-01 LAB — TROPONIN I (HIGH SENSITIVITY)
Troponin I (High Sensitivity): <2 ng/L (ref <18)
Troponin I (High Sensitivity): <2 ng/L (ref <18)

## 2023-02-01 LAB — RESP PANEL BY RT-PCR (RSV, FLU A&B, COVID)  RVPGX2
Influenza A by PCR: NEGATIVE
Influenza B by PCR: NEGATIVE
Resp Syncytial Virus by PCR: NEGATIVE
SARS Coronavirus 2 by RT PCR: NEGATIVE

## 2023-02-01 LAB — CBC
HCT: 43.4 % (ref 39.0–52.0)
Hemoglobin: 14.6 g/dL (ref 13.0–17.0)
MCH: 29.3 pg (ref 26.0–34.0)
MCHC: 33.6 g/dL (ref 30.0–36.0)
MCV: 87.1 fL (ref 80.0–100.0)
Platelets: 250 10*3/uL (ref 150–400)
RBC: 4.98 MIL/uL (ref 4.22–5.81)
RDW: 13.1 % (ref 11.5–15.5)
WBC: 7.7 10*3/uL (ref 4.0–10.5)
nRBC: 0 % (ref 0.0–0.2)
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
## 2023-02-01 NOTE — ED Triage Notes (Addendum)
Pt c/o of shortness of breath at rest for approx 2 hours. Endorses centralized chest pain described as tight. Pt states the pain is worse with inspiration. Pt also endorses sinus pressure for the past couple weeks.

## 2023-02-01 NOTE — ED Triage Notes (Signed)
Pt is here for chest feeling, tight, head feels heavy , whenever pt takes a deep breath he feels pain since today

## 2023-02-01 NOTE — ED Provider Triage Note (Signed)
Emergency Medicine Provider Triage Evaluation Note  John [name] , a 37 y.o. male  was evaluated in triage.  Pt complains of an urgent care for chest pain and shortness of breath.  Patient has had 3 hours of chest pain with exertional dyspnea.  He states that earlier he was going up and down the steps at his house and wants time to try to catch his breath which is abnormal..  Review of Systems  Positive: cp Negative: fever  Physical Exam  BP (!) 167/97   Pulse (!) 56   Temp 97.8 F (36.6 C) (Oral)   Resp 20   Ht 6' 4"$  (1.93 m)   Wt 104.3 kg   SpO2 99%   BMI 28.00 kg/m  Gen:   Awake, no distress   Resp:  Normal effort  MSK:   Moves extremities without difficulty  Other:    Medical Decision Making  Medically screening exam initiated at 8:57 PM.  Appropriate orders placed.  Jennye Moccasin was informed that the remainder of the evaluation will be completed by another provider, this initial triage assessment does not replace that evaluation, and the importance of remaining in the ED until their evaluation is complete.     Margarita Mail, PA-C 02/01/23 2058

## 2023-02-01 NOTE — ED Notes (Signed)
Patient is being discharged from the Urgent Care and sent to the Emergency Department via POV . Per Junie Panning, [place], patient is in need of higher level of care due to chest pain. Patient is aware and verbalizes understanding of plan of care.  Vitals:   02/01/23 1911  BP: (!) 142/84  Pulse: 68  Resp: 16  Temp: 98.2 F (36.8 C)  SpO2: 98%   POV

## 2023-02-02 ENCOUNTER — Other Ambulatory Visit: Payer: Self-pay
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
## 2023-02-17 ENCOUNTER — Ambulatory Visit: Payer: Self-pay | Admitting: Internal Medicine

## 2023-02-17 ENCOUNTER — Encounter: Payer: Self-pay | Admitting: Internal Medicine

## 2023-02-17 VITALS — BP 144/88 | HR 80 | Resp 16 | Ht 76.0 in | Wt 217.0 lb

## 2023-02-17 DIAGNOSIS — F319 Bipolar disorder, unspecified: Secondary | ICD-10-CM

## 2023-02-17 DIAGNOSIS — I10 Essential (primary) hypertension: Secondary | ICD-10-CM

## 2023-02-17 DIAGNOSIS — G44209 Tension-type headache, unspecified, not intractable: Secondary | ICD-10-CM

## 2023-02-17 DIAGNOSIS — F900 Attention-deficit hyperactivity disorder, predominantly inattentive type: Secondary | ICD-10-CM

## 2023-02-17 NOTE — Progress Notes (Signed)
    Subjective:    Patient ID: John [name], male   DOB: Jan 27, 1986, 37 y.o.   MRN: [id number]   HPI   Hypertension:  has been missing medication frequently.  Holds his medication frequently for different reasons.  Recent kidney function normal.    2.  Bipolar Disorder:  States he feels his health issues in this arena are a result more so of ADHD and so he is working with mental health on treating ADHD.  He is not taking the Strattera, Escitalopram nor Lamotrigine.  He is taking a supplement, Theanine instead.  States has not had a depressive or manic episode for 2 years.  I cannot see from his mental health notes if he is expected to take these prescribed meds.  Discussed with him removing from his list.    3.  HM:  discussed COVID vaccine.  Would like to think about it.  4.  Chest wall pain:  evaluated in ED and still has not gone back to working out.    5.  Headache--points to right trap and then up and over head to above eye.  Current Meds  Medication Sig   amLODipine (NORVASC) 5 MG tablet Take 1 tablet (5 mg total) by mouth daily. (Patient taking differently: Take 5 mg by mouth daily. Not consistently)   Ascorbic Acid (VITAMIN C) 1000 MG tablet Take 1,000 mg by mouth daily.   Ashwagandha 125 MG CAPS Take 125 mg by mouth every other day. Two caps every other day   B Complex Vitamins (VITAMIN B COMPLEX) CAPS Take 250 capsules by mouth daily.   loratadine (CLARITIN) 10 MG tablet Take 10 mg by mouth as needed for allergies.   Magnesium 300 MG CAPS Take 1,200 mg by mouth daily. 4 caps by mouth daily   melatonin 5 MG TABS Take 1 tablet (5 mg total) by mouth at bedtime as needed.   mometasone (NASONEX) 50 MCG/ACT nasal spray 2 sprays each nostril daily during your allergy seasons.   Multiple Vitamin (MULTI-VITAMIN DAILY PO) Take by mouth.   Omega-3 Fatty Acids (FISH OIL) 1000 MG CAPS Take by mouth.   THEANINE PO Take 1,000 mg by mouth. Every other day   Tyrosine 500 MG TABS Take 1 tablet  by mouth every other day. Every other day   No Known Allergies   Review of Systems    Objective:   BP (!) 144/88 (BP Location: Left Arm, Patient Position: Sitting, Cuff Size: Normal)   Pulse 80   Resp 16   Ht 6\' 4"  (1.93 m)   Wt 217 lb (98.4 kg)   BMI 26.41 kg/m   Physical Exam NAD HEENT:  PERRL, EOMI, TMs pearly gray, Neck:  Supple, but tight and tender along right trap to paraspinous musculature to nuchal ridge on right Lungs:  CTA CV:  RRR without murmur or rub.  Radial and DP pulses normal and equal Abd:  S, NT, NO HSM or mass, + BS LE:  No edema   Assessment & Plan   Hypertension:  Discussed importance of taking amlodipine regularly to prevent damage to vascular system.    2.  Bipolar Disorder/Possible ADHD:  as per psych.  History of starting and stopping meds.    3.  Headaches:  Tension:  discussed stretches after warm packing:  right neck and upper back/trap.  4.  HM:  he will think about COVID vaccination

## 2023-03-07 ENCOUNTER — Other Ambulatory Visit: Payer: Self-pay

## 2023-03-07 VITALS — BP 138/88 | HR 68

## 2023-03-07 DIAGNOSIS — Z0130 Encounter for examination of blood pressure without abnormal findings: Secondary | ICD-10-CM

## 2023-03-07 NOTE — Progress Notes (Unsigned)
Patient reports that he has been taking bp medication consistently.   After reporting bp to Dr Amil Amen, no changes to medication will be made.

## 2023-04-13 ENCOUNTER — Encounter (HOSPITAL_COMMUNITY): Payer: Self-pay

## 2023-04-13 ENCOUNTER — Ambulatory Visit (INDEPENDENT_AMBULATORY_CARE_PROVIDER_SITE_OTHER): Payer: No Payment, Other | Admitting: Clinical

## 2023-04-13 DIAGNOSIS — F900 Attention-deficit hyperactivity disorder, predominantly inattentive type: Secondary | ICD-10-CM | POA: Diagnosis not present

## 2023-04-13 NOTE — Progress Notes (Signed)
THERAPIST PROGRESS NOTE  Session Time: 60 minutes  Participation Level: Active  Behavioral Response: CasualAlertAnxious  Type of Therapy: Individual Therapy  Treatment Goals addressed: client will identify cognitive patterns and beliefs that interfere with therapy once per session  ProgressTowards Goals: Progressing  Interventions: CBT and Supportive  Summary:  John [name] is a 37 y.o. male who presents for a walk in therapy appointment. Client presents oriented times five, appropriately dressed, and friendly. Client denied hallucinations and delusions. Client reported on today he has been going through a lot. Client reported he will be moving apartments and asked if he can have a letter of support to have his dog living with him. Client reported otherwise he had been feeling depressed because he had not been to the gym in two months due to a back injury. Client reported physical activity to help with his mood and it is also beneficial for his adhd symptoms. Client reported he has been having stress behind his sister who has run away and been in and out of hospitals. Client reported she has a guardian but he still tries to support her in other ways to have her needs met but it is hard because she is impulsive and doe snot process instruction he tries to give her. Client reported likewise with his biological mother in [place] having a hard time balancing her in his life. Client reported he wants to keep the connection. Client reported his work life balance is tricky as he does not have set days he can rely on to do things. Client reported he is enjoying learning new skills to help him further his skill set. Client reported he wants to venture out to other careers but has a lot he is interested in.  Evidence of progress towards goal:  client reported using 2 skills of behavioral activation by going to the gyn at least 3 days per week and considering the pros and cons of his decisions before  making a further decisions. Flowsheet Row Counselor from 04/13/2023 in [facility]  PHQ-9 Total Score 0        Suicidal/Homicidal: Nowithout intent/plan  Therapist Response:  Therapist began the appointment asking the client how he has been doing since last seen. Therapist used CBT to engage using active listening and positive emotional support. Therapist used CBT to ask the client to discuss changes in his work, family, and socia life. Therapist used CBT to engage and reinforce the clients carefully thought out choices and how he is adjusting his daily routine to keep a healthy mind and body. Therapist completed SDOH and updated signature. Therapist used CBT ask the client to identify his progress with frequency of use with coping skills with continued practice in his daily activity.    Therapist assigned the client homework to practice self care.   Plan: Client reported he will return for walk in therapy as needed.  Diagnosis: adhd, predominately inattentive  Collaboration of Care: Patient refused AEB none requested   Patient/Guardian was advised Release of Information must be obtained prior to any record release in order to collaborate their care with an outside provider. Patient/Guardian was advised if they have not already done so to contact the registration department to sign all necessary forms in order for [place] to release information regarding their care.   Consent: Patient/Guardian gives verbal consent for treatment and assignment of benefits for services provided during this visit. Patient/Guardian expressed understanding and agreed to proceed.   Neena Rhymes  Darrelle Barrell, LCSW 04/13/2023

## 2023-06-20 ENCOUNTER — Telehealth: Payer: Self-pay

## 2023-06-20 NOTE — Telephone Encounter (Signed)
Patient would like to be on waiting list for a sooner appointment than his original appointment 10/06/23  Will call patient if there is a cancellation.

## 2023-06-21 ENCOUNTER — Encounter: Payer: Self-pay | Admitting: Internal Medicine

## 2023-08-16 NOTE — Telephone Encounter (Signed)
Called patient to offer appointment patient did not answer.

## 2023-09-01 ENCOUNTER — Ambulatory Visit (HOSPITAL_COMMUNITY): Payer: No Payment, Other | Admitting: Clinical

## 2023-09-01 DIAGNOSIS — F900 Attention-deficit hyperactivity disorder, predominantly inattentive type: Secondary | ICD-10-CM
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
## 2023-10-06 ENCOUNTER — Encounter: Payer: Self-pay | Admitting: Internal Medicine

## 2023-10-06 ENCOUNTER — Ambulatory Visit: Payer: Self-pay | Admitting: Internal Medicine

## 2023-10-06 VITALS — BP 148/92 | HR 73 | Resp 12 | Ht 76.0 in | Wt 227.0 lb

## 2023-10-06 DIAGNOSIS — G44209 Tension-type headache, unspecified, not intractable: Secondary | ICD-10-CM | POA: Insufficient documentation

## 2023-10-06 DIAGNOSIS — I10 Essential (primary) hypertension: Secondary | ICD-10-CM

## 2023-10-06 DIAGNOSIS — F900 Attention-deficit hyperactivity disorder, predominantly inattentive type: Secondary | ICD-10-CM

## 2023-10-06 DIAGNOSIS — Z0000 Encounter for general adult medical examination without abnormal findings: Secondary | ICD-10-CM

## 2023-10-06 DIAGNOSIS — F319 Bipolar disorder, unspecified: Secondary | ICD-10-CM

## 2023-10-06 MED ORDER — AMLODIPINE BESYLATE 5 MG PO TABS: 5.0000 mg | ORAL_TABLET | Freq: Every day | ORAL | 3 refills | Status: AC

## 2023-10-06 MED ORDER — TRIAMCINOLONE ACETONIDE 0.1 % EX CREA: TOPICAL_CREAM | CUTANEOUS | 3 refills | Status: DC

## 2023-10-06 MED ORDER — AMLODIPINE BESYLATE 5 MG PO TABS: 5.0000 mg | ORAL_TABLET | Freq: Every day | ORAL | 3 refills | Status: DC
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
## 2023-10-06 NOTE — Patient Instructions (Signed)
Eucerin Cream for eczema relief applied all over after triamcinolone cream to patches on legs

## 2023-10-07 NOTE — Telephone Encounter (Signed)
Patient has been seen.

## 2023-10-11 ENCOUNTER — Other Ambulatory Visit (INDEPENDENT_AMBULATORY_CARE_PROVIDER_SITE_OTHER): Payer: Self-pay

## 2023-10-11 DIAGNOSIS — Z0000 Encounter for general adult medical examination without abnormal findings: Secondary | ICD-10-CM

## 2023-10-12 LAB — COMPREHENSIVE METABOLIC PANEL
ALT: 16 [IU]/L (ref 0–44)
AST: 32 [IU]/L (ref 0–40)
Albumin: 4.7 g/dL (ref 4.1–5.1)
Alkaline Phosphatase: 82 [IU]/L (ref 44–121)
BUN/Creatinine Ratio: 12 (ref 9–20)
BUN: 13 mg/dL (ref 6–20)
Bilirubin Total: 0.6 mg/dL (ref 0.0–1.2)
CO2: 18 mmol/L — ABNORMAL LOW (ref 20–29)
Calcium: 9.9 mg/dL (ref 8.7–10.2)
Chloride: 100 mmol/L (ref 96–106)
Creatinine, Ser: 1.12 mg/dL (ref 0.76–1.27)
Globulin, Total: 2.8 g/dL (ref 1.5–4.5)
Glucose: 67 mg/dL — ABNORMAL LOW (ref 70–99)
Potassium: 4.2 mmol/L (ref 3.5–5.2)
Sodium: 137 mmol/L (ref 134–144)
Total Protein: 7.5 g/dL (ref 6.0–8.5)
eGFR: 88 mL/min/{1.73_m2} (ref >59)

## 2023-10-12 LAB — CBC WITH DIFFERENTIAL/PLATELET
Basophils Absolute: 0 10*3/uL (ref 0.0–0.2)
Basos: 0 %
EOS (ABSOLUTE): 0 10*3/uL (ref 0.0–0.4)
Eos: 0 %
Hematocrit: 47 % (ref 37.5–51.0)
Hemoglobin: 15.5 g/dL (ref 13.0–17.7)
Immature Grans (Abs): 0 10*3/uL (ref 0.0–0.1)
Immature Granulocytes: 0 %
Lymphocytes Absolute: 1.6 10*3/uL (ref 0.7–3.1)
Lymphs: 28 %
MCH: 29.5 pg (ref 26.6–33.0)
MCHC: 33 g/dL (ref 31.5–35.7)
MCV: 90 fL (ref 79–97)
Monocytes Absolute: 0.3 10*3/uL (ref 0.1–0.9)
Monocytes: 6 %
Neutrophils Absolute: 3.6 10*3/uL (ref 1.4–7.0)
Neutrophils: 66 %
Platelets: 294 10*3/uL (ref 150–450)
RBC: 5.25 x10E6/uL (ref 4.14–5.80)
RDW: 13.5 % (ref 11.6–15.4)
WBC: 5.5 10*3/uL (ref 3.4–10.8)

## 2023-10-12 LAB — LIPID PANEL W/O CHOL/HDL RATIO
Cholesterol, Total: 160 mg/dL (ref 100–199)
HDL: 48 mg/dL (ref >39)
LDL Chol Calc (NIH): 102 mg/dL — ABNORMAL HIGH (ref 0–99)
Triglycerides: 48 mg/dL (ref 0–149)
VLDL Cholesterol Cal: 10 mg/dL (ref 5–40)

## 2023-10-26 ENCOUNTER — Ambulatory Visit (INDEPENDENT_AMBULATORY_CARE_PROVIDER_SITE_OTHER): Payer: No Payment, Other | Admitting: Clinical

## 2023-10-26 ENCOUNTER — Encounter (HOSPITAL_COMMUNITY): Payer: Self-pay

## 2023-10-26 DIAGNOSIS — F900 Attention-deficit hyperactivity disorder, predominantly inattentive type: Secondary | ICD-10-CM

## 2023-10-26 NOTE — Progress Notes (Signed)
   THERAPIST PROGRESS NOTE  Session Time: 30 minutes  Participation Level: Active  Behavioral Response: CasualAlertEuthymic  Type of Therapy: Individual Therapy  Treatment Goals addressed:  John [name] WILL IDENTIFY COGNITIVE PATTERNS AND BELIEFS THAT INTERFERE WITH THERAPY ONCE PER SESSION   ProgressTowards Goals: Progressing  Interventions: CBT  Summary:  John [name] is a 37 y.o. male who presents for the scheduled appointment oriented x 5, appropriately dressed, and friendly.  Client denied hallucinations and delusions. Client presents for a walk-in therapy appointment.  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  Client reported he noticed that his promotion has come with some backlash from people that he works with directly.  Client reported they wanted another person in the position that he was promoted to and he sees that they tried to do things to intentionally get a negative reaction out of him.  Client reported he is able to see past their intent and is able to hold himself to a appropriate professional composure.  Client reported otherwise he is still thinking about his future and other career adventures that he wants to go into.  Client reported he is interested helping others in a mental health facility and work through being of positive support through community resources. Evidence of progress towards goal: Client reported 1 positive of using appropriate communication skills in stressful situations at least 4 days out of 7.   Suicidal/Homicidal: Nowithout intent/plan  Therapist Response:  Therapist began the appointment asking the client how he has been doing since last seen. Therapist used CBT to engage using active listening and positive emotional support towards his thoughts and feelings. Therapist used CBT to ask the client to identify changes that have occurred at may be contributing to a  negative thought process. Therapist used CBT to positively reinforce the clients appropriate communication skills and boundaries when working in a professional setting. Therapist used CBT ask the client to identify his progress with frequency of use with coping skills with continued practice in his daily activity.       Plan: Return again in 4 weeks.  Diagnosis: ADHD, inattentive type  Collaboration of Care: Patient refused AEB none requested by the client.  Patient/Guardian was advised Release of Information must be obtained prior to any record release in order to collaborate their care with an outside provider. Patient/Guardian was advised if they have not already done so to contact the registration department to sign all necessary forms in order for [place] to release information regarding their care.   Consent: Patient/Guardian gives verbal consent for treatment and assignment of benefits for services provided during this visit. Patient/Guardian expressed understanding and agreed to proceed.   Neena Rhymes Jess Sulak, LCSW 10/26/2023

## 2023-11-16 ENCOUNTER — Ambulatory Visit: Payer: Self-pay

## 2023-11-16 ENCOUNTER — Other Ambulatory Visit: Payer: Self-pay | Admitting: Nurse Practitioner

## 2023-11-16 DIAGNOSIS — M25512 Pain in left shoulder: Secondary | ICD-10-CM

## 2023-11-17 ENCOUNTER — Telehealth (HOSPITAL_COMMUNITY): Payer: Self-pay | Admitting: Clinical

## 2023-11-17 ENCOUNTER — Ambulatory Visit: Payer: Self-pay | Admitting: Internal Medicine

## 2023-11-17 ENCOUNTER — Encounter: Payer: Self-pay | Admitting: Internal Medicine

## 2023-11-17 ENCOUNTER — Other Ambulatory Visit: Payer: Self-pay

## 2023-11-17 VITALS — BP 126/86 | HR 88 | Resp 16 | Ht 76.0 in | Wt 226.0 lb

## 2023-11-17 DIAGNOSIS — M542 Cervicalgia: Secondary | ICD-10-CM

## 2023-11-17 DIAGNOSIS — M25512 Pain in left shoulder: Secondary | ICD-10-CM
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
## 2023-11-17 NOTE — Patient Instructions (Signed)
Stretches and range of motion twice daily--10 stretches in each position.

## 2023-11-24 ENCOUNTER — Ambulatory Visit: Payer: Self-pay

## 2023-11-30 ENCOUNTER — Ambulatory Visit (INDEPENDENT_AMBULATORY_CARE_PROVIDER_SITE_OTHER): Payer: No Payment, Other | Admitting: Clinical

## 2023-11-30 DIAGNOSIS — F411 Generalized anxiety disorder: Secondary | ICD-10-CM | POA: Diagnosis not present
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
## 2023-12-02 ENCOUNTER — Ambulatory Visit: Payer: Self-pay

## 2023-12-02 ENCOUNTER — Other Ambulatory Visit: Payer: Self-pay | Admitting: Nurse Practitioner

## 2023-12-02 DIAGNOSIS — M542 Cervicalgia: Secondary | ICD-10-CM

## 2023-12-16 ENCOUNTER — Ambulatory Visit (HOSPITAL_COMMUNITY): Payer: No Payment, Other | Admitting: Clinical

## 2023-12-16 DIAGNOSIS — F411 Generalized anxiety disorder: Secondary | ICD-10-CM

## 2023-12-16 NOTE — Progress Notes (Signed)
 THERAPIST PROGRESS NOTE Virtual Visit via Video Note  I connected with John [name] on 12/16/23 at 10:00 AM EST by a video enabled telemedicine application and verified that I am speaking with the correct person using two identifiers.  Location: Patient: home Provider: office   I discussed the limitations of evaluation and management by telemedicine and the availability of in person appointments. The patient expressed understanding and agreed to proceed.   Follow Up Instructions: I discussed the assessment and treatment plan with the patient. The patient was provided an opportunity to ask questions and all were answered. The patient agreed with the plan and demonstrated an understanding of the instructions.   The patient was advised to call back or seek an in-person evaluation if the symptoms worsen or if the condition fails to improve as anticipated.   Session Time: 50 minutes  Participation Level: Active  Behavioral Response: CasualAlertEuthymic  Type of Therapy: Individual Therapy  Treatment Goals addressed:  John WILL IDENTIFY COGNITIVE PATTERNS AND BELIEFS THAT INTERFERE WITH THERAPY ONCE PER SESSION   ProgressTowards Goals: Progressing  Interventions: CBT  Summary:  John [name] is a 38 y.o. male who presents for the scheduled appointment oriented x 5, appropriately dressed, and friendly.  Client denied hallucinations and delusions. Client reported on today he is managing fairly okay.  Client reported he is in physical therapy for his shoulder related to the work injury.  Client reported otherwise he has been thinking about his relationship with family in particular.  Client reported he has done a lot for them without any expectations.  Client reported over time things are gone to the point where they expect him to just do certain things and come whenever they call.  Client reported he received backlash when he does not do things as they request.  Client reported he is  at a point again where he is considering cutting off his family or how to figure out putting distance between himself and them.  Client reported he just does not want to get to a point where he blows up on them.  Client reported either way things go they use his mental health against him or just say that he is being mean.  Client reported he has a lot of things in the air to consider such as what will happen with his job, if he may have to seriously consider moving to [place]  to take care of his mother and what his future career path could look like if he ends up leaving his current job. Evidence of progress towards goal: Client reported 1 goal he is working on which is approving the timeliness of his communication and how to appropriately communicate his issues and needs.   Suicidal/Homicidal: Nowithout intent/plan  Therapist Response:  Therapist began the appointment asking client how he has been doing since last seen. Therapist used CBT to engage with active listening and positive emotional support. Therapist used CBT to engage with the client and give him time to express his thoughts and feelings about concerns with work, his health, and family. Therapist used CBT to collaborate and continue to teach the client about how to appropriately communicate his concerns and appropriately asserting boundaries with others. Therapist used CBT ask the client to identify his progress with frequency of use with coping skills with continued practice in his daily activity.    Therapist assigned client homework to practice the communication skills discussed.   Plan: Return again in 4 weeks.  Diagnosis: GAD  Collaboration of Care: Patient refused AEB none requested by the client.  Patient/Guardian was advised Release of Information must be obtained prior to any record release in order to collaborate their care with an outside provider. Patient/Guardian was advised if they have not already done so to contact  the registration department to sign all necessary forms in order for us  to release information regarding their care.   Consent: Patient/Guardian gives verbal consent for treatment and assignment of benefits for services provided during this visit. Patient/Guardian expressed understanding and agreed to proceed.   Shigeo Baugh Y Leslie Langille, LCSW 12/16/2023

## 2023-12-19 DIAGNOSIS — M542 Cervicalgia: Secondary | ICD-10-CM | POA: Insufficient documentation

## 2023-12-19 DIAGNOSIS — M25512 Pain in left shoulder: Secondary | ICD-10-CM | POA: Insufficient documentation

## 2023-12-21 ENCOUNTER — Ambulatory Visit (HOSPITAL_COMMUNITY): Payer: No Payment, Other | Admitting: Clinical

## 2023-12-21 DIAGNOSIS — F411 Generalized anxiety disorder: Secondary | ICD-10-CM | POA: Diagnosis not present

## 2023-12-21 NOTE — Progress Notes (Signed)
   THERAPIST PROGRESS NOTE  Session Time: 60 minutes  Participation Level: Active  Behavioral Response: CasualAlertIrritable  Type of Therapy: Individual Therapy  Treatment Goals addressed:  Alm WILL IDENTIFY COGNITIVE PATTERNS AND BELIEFS THAT INTERFERE WITH THERAPY ONCE PER SESSION   ProgressTowards Goals: Progressing  Interventions: CBT  Summary:  Maclain Cohron is a 38 y.o. male who presents for the walk- in appointment oriented times five, appropriately dressed and friendly. Client denied hallucinations and delusions. Client reported he has been feeling frustrated. Client reported he is continuing to see a doctor a physical therapist for the chronic pain. Client reported he has a chronic thobbing radiating from his shoulder to his neck. Client reported he has been trying to convey his concern with doctor but feels he is not being heard. Client reported he was also told insurance has been a hold up for further testing. Client reported the pain is showing to be a hindrance on him mentally and physically because it dictates his interaction with others and being able to do his daily activity. Client reported he has been thinking about how he can maximize his time and maybe pursue other goals within means. 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 Evidence of progress towards goal:  client reported 2 positives of working towards personal goals of continuing education and career.     Suicidal/Homicidal: Nowithout intent/plan  Therapist Response:  Therapist began the appointment asking the client how he has been doing. Therapist used cbt to engage with active listening and positive emotional support. Therapist used cbt to engage and give the client time to discus his issues with work, health and daily life. Therapist used cbt to validate the clients thoughts and emotions. Therapist used cbt to  engage with the client to discuss balanced thoughts and what he can do within means to keep him positively engaged in daily life. Therapist used CBT ask the client to identify her progress with frequency of use with coping skills with continued practice in her daily activity.    Therapist assigned the client homework to practice self care.   Plan: Return again in 4 weeks.  Diagnosis: GAD  Collaboration of Care: Patient refused AEB none requested.  Patient/Guardian was advised Release of Information must be obtained prior to any record release in order to collaborate their care with an outside provider. Patient/Guardian was advised if they have not already done so to contact the registration department to sign all necessary forms in order for us  to release information regarding their care.   Consent: Patient/Guardian gives verbal consent for treatment and assignment of benefits for services provided during this visit. Patient/Guardian expressed understanding and agreed to proceed.   Tomesha Sargent Y Kipper Buch, LCSW 12/21/2023

## 2024-01-11 ENCOUNTER — Ambulatory Visit (INDEPENDENT_AMBULATORY_CARE_PROVIDER_SITE_OTHER): Payer: No Payment, Other | Admitting: Clinical

## 2024-01-11 DIAGNOSIS — F411 Generalized anxiety disorder: Secondary | ICD-10-CM | POA: Diagnosis not present
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
## 2024-01-18 ENCOUNTER — Ambulatory Visit: Payer: Self-pay | Admitting: Internal Medicine

## 2024-01-18 ENCOUNTER — Telehealth: Payer: Self-pay

## 2024-01-18 DIAGNOSIS — M25512 Pain in left shoulder: Secondary | ICD-10-CM

## 2024-01-18 DIAGNOSIS — M542 Cervicalgia: Secondary | ICD-10-CM
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
## 2024-01-18 NOTE — Patient Instructions (Signed)
Elbow pad

## 2024-01-18 NOTE — Telephone Encounter (Signed)
 Patients MRI results are in patients Care Everywhere tab

## 2024-01-26 ENCOUNTER — Telehealth: Payer: Self-pay

## 2024-01-26 NOTE — Telephone Encounter (Signed)
Patient would like a letter excusing him from work until he finds a new orthopedic specialist. Patient reports that previous provider Gean Birchwood MD at [facility] was not providing sufficient work restricts to help him heal. Reports that even with the restricts to not lift over 15lb, he was having significant pain at work. Patient also reported that he has been terminated from further care with Gean Birchwood MD. Patients lawyer has instructed him to find a new provider for his case.

## 2024-02-02 ENCOUNTER — Ambulatory Visit (INDEPENDENT_AMBULATORY_CARE_PROVIDER_SITE_OTHER): Payer: No Payment, Other | Admitting: Clinical

## 2024-02-02 DIAGNOSIS — F411 Generalized anxiety disorder: Secondary | ICD-10-CM | POA: Diagnosis not present

## 2024-02-03 NOTE — Telephone Encounter (Signed)
Patient has picked up letter.

## 2024-02-15 ENCOUNTER — Ambulatory Visit (INDEPENDENT_AMBULATORY_CARE_PROVIDER_SITE_OTHER): Payer: MEDICAID | Admitting: Physician Assistant

## 2024-02-15 VITALS — BP 165/99 | HR 73 | Temp 98.2°F | Ht 77.0 in | Wt 218.0 lb

## 2024-02-15 DIAGNOSIS — F900 Attention-deficit hyperactivity disorder, predominantly inattentive type: Secondary | ICD-10-CM

## 2024-02-15 MED ORDER — ATOMOXETINE HCL 40 MG PO CAPS: 40.0000 mg | ORAL_CAPSULE | Freq: Every day | ORAL | 1 refills | Status: AC

## 2024-02-15 NOTE — Progress Notes (Signed)
 Psychiatric Initial Adult Assessment   Patient Identification: John [name] MRN:  [id number] Date of Evaluation:  02/15/2024 Referral Source: Walk-in Chief Complaint:   Chief Complaint  Patient presents with   Other    Reestablish care   Medication Management   Visit Diagnosis:    ICD-10-CM   1. Attention deficit hyperactivity disorder (ADHD), predominantly inattentive type  F90.0 atomoxetine (STRATTERA) 40 MG capsule      History of Present Illness:    [name] is a 38 year old male with a past psychiatric history significant for bipolar disorder and attention deficit hyperactivity disorder (predominantly inattentive type) who presents to [facility] to reestablish psychiatric care and for medication management.  Patient was last seen by Toy Cookey, NP on 06/09/2022.  During his last encounter, patient was being managed on the following psychiatric medications: Strattera 40 mg daily.  Patient presents today encounter stating that he wants to get back on methylphenidate for the management of his ADHD.  Patient reports that he has been on several psychiatric medications in the past including trazodone, Lamictal, risperidone, Lexapro.  Patient reports that the only medication that has been helpful in managing his symptoms was methylphenidate.  Patient also presents to the encounter stating that he does not agree with his bipolar disorder diagnosis.  Patient reports that he has several symptoms related to autism spectrum disorder.  Patient states that he has a difficult time picking up on social cues.  He believes that many of his problems are due to a potential diagnosis of autism and not bipolar disorder.  Patient presents to the encounter endorsing trouble with focus, trouble with memory, and difficulty keeping up with tasks.  Patient also states that he daydreams often, has poor concentration, poor organizational skills, and difficulty  prioritizing tasks.  He reports having issues with forgetfulness and often loses/misplacing things.  Patient has also noticed interrupting others, blurting out responses, having trouble waiting his turn, poor time management, and extreme impatience.  Patient denies depression but does endorse anxiety due to excess energy.  Patient endorses stressors related to work.  He denies recent history of manic episodes.  Patient denies a history of hospitalization due to mental health.  He further denies a history of suicide attempt.  A GAD-7 screen was performed with the patient scoring a 5.  Patient is alert and oriented x 4, calm, cooperative, and fully engaged in conversation during the encounter.  Patient describes his mood as optimistic.  Patient exhibits anxious mood with appropriate affect.  Patient denies suicidal or homicidal ideation.  He further denies auditory or visual hallucinations and does not appear to be responding to internal/external stimuli.  Patient denies paranoia or delusional thoughts.  Patient endorses good sleep (on average 7 hours of sleep per night.  Patient endorses good appetite and eats on average 3 meals per day.  Patient denies alcohol consumption, tobacco use, or illicit drug use.  Associated Signs/Symptoms: Depression Symptoms:  psychomotor agitation, psychomotor retardation, fatigue, difficulty concentrating, impaired memory, anxiety, loss of energy/fatigue, disturbed sleep, (Hypo) Manic Symptoms:  Distractibility, Flight of Ideas, Impulsivity, Irritable Mood, Anxiety Symptoms:  Obsessive Compulsive Symptoms:   Cleaning, Social Anxiety, Psychotic Symptoms:   Patient denies PTSD Symptoms: Had a traumatic exposure:  Patient endorses a history of traumatic experiences. Patient states that the way he injured his shoulder was traumatic. Had a traumatic exposure in the last month:  N/A Re-experiencing:  None Hypervigilance:  No Hyperarousal:  Difficulty  Concentrating Emotional Numbness/Detachment Avoidance:  None  Past Psychiatric History:  Patient endorses a past psychiatric history significant for attention deficit hyperactivity disorder (predominantly inattentive type).  Patient also has been diagnosed with bipolar disorder in the past.  Patient suspects that he has a history of autism.  Patient denies a past history of hospitalization due to mental health.  Patient denies a past history of suicide attempt  Patient denies a past history of homicide attempt  Previous Psychotropic Medications: Patient has been on Ritalin and Strattera in the past.  Patient has also been on Geodon, trazodone, escitalopram, and Lamictal.  Patient denies a past history of hospitalization due to mental health.  Patient denies a past history of suicide attempts.  Patient denies a past history of homicide attempt.  Substance Abuse History in the last 12 months: Unknown.  Patient does have a past history of marijuana use  Consequences of Substance Abuse: Negative  Past Medical History:  Past Medical History:  Diagnosis Date   ADHD (attention deficit hyperactivity disorder)    Bipolar 1 disorder (HCC)    Hypertension    Seasonal allergies    Thyroid disease 2017   Unknown what exactly--[place] in 2017    Past Surgical History:  Procedure Laterality Date   WISDOM TOOTH EXTRACTION      Family Psychiatric History:  Mother - schizophrenia Sister - schizophrenia  Family history of suicide attempt: Patient denies Family history of homicide attempt: Patient denies Family history of substance abuse: Patient denies  Family History:  Family History  Problem Relation Age of Onset   Hypertension Mother    Schizophrenia Mother    Schizophrenia Sister     Social History:   Social History   Socioeconomic History   Marital status: Single    Spouse name: Not on file   Number of children: 0   Years of education: 15   Highest education level:  Some college, no degree  Occupational History   Occupation: Biomedical scientist at [employer]  Tobacco Use   Smoking status: Former    Current packs/day: 0.00    Average packs/day: 0.3 packs/day for 8.0 years (2.0 ttl pk-yrs)    Types: Cigarettes    Start date: 05/02/2011    Quit date: 05/02/2019    Years since quitting: 4.8    Passive exposure: Past   Smokeless tobacco: Never  Vaping Use   Vaping status: Former  Substance and Sexual Activity   Alcohol use: Yes    Comment: 1-2 drinks weekly   Drug use: Not Currently    Types: Marijuana    Comment: None since 2022.   Sexual activity: Not Currently  Other Topics Concern   Not on file  Social History Narrative   Lives alone   Working with [employer] for some financial support.   No significant other at this time.   Social Drivers of Corporate investment banker Strain: Low Risk  (10/06/2023)   Overall Financial Resource Strain (CARDIA)    Difficulty of Paying Living Expenses: Not very hard  Food Insecurity: Food Insecurity Present (10/06/2023)   Hunger Vital Sign    Worried About Running Out of Food in the Last Year: Sometimes true    Ran Out of Food in the Last Year: Sometimes true  Transportation Needs: No Transportation Needs (10/06/2023)   PRAPARE - Administrator, Civil Service (Medical): No    Lack of Transportation (Non-Medical): No  Physical Activity: Sufficiently Active (10/02/2019)   Exercise Vital Sign  Days of Exercise per Week: 7 days    Minutes of Exercise per Session: 120 min  Stress: No Stress Concern Present (10/02/2019)   [employer] of Occupational Health - Occupational Stress Questionnaire    Feeling of Stress : Not at all  Social Connections: Not on file    Additional Social History:  Patient endorses social support.  Patient denies having children of his own.  Patient endorses helping.  Patient is currently employed.  Patient denies a past history of military experience.  Patient  endorses a past history of jail time stating that he was in jail roughly 8 years ago.  Patient has a history of completing some college.  Patient denies access to weapons.  Allergies:  No Known Allergies  Metabolic Disorder Labs: Lab Results  Component Value Date   HGBA1C 5.4 10/02/2019   No results found for: "PROLACTIN" Lab Results  Component Value Date   CHOL 160 10/11/2023   TRIG 48 10/11/2023   HDL 48 10/11/2023   CHOLHDL 2.6 07/26/2013   VLDL 8 07/26/2013   LDLCALC 102 (H) 10/11/2023   LDLCALC 75 08/18/2022   Lab Results  Component Value Date   TSH 4.370 10/02/2019    Therapeutic Level Labs: No results found for: "LITHIUM" No results found for: "CBMZ" Lab Results  Component Value Date   VALPROATE 19 (L) 08/02/2020    Current Medications: Current Outpatient Medications  Medication Sig Dispense Refill   atomoxetine (STRATTERA) 40 MG capsule Take 1 capsule (40 mg total) by mouth daily. 30 capsule 1   amLODipine (NORVASC) 5 MG tablet Take 1 tablet (5 mg total) by mouth daily. 90 tablet 3   Ascorbic Acid (VITAMIN C) 1000 MG tablet Take 1,000 mg by mouth daily.     Ashwagandha 125 MG CAPS Take 125 mg by mouth every other day. Two caps every other day     B Complex Vitamins (VITAMIN B COMPLEX) CAPS Take 250 capsules by mouth daily.     loratadine (CLARITIN) 10 MG tablet Take 10 mg by mouth as needed for allergies.     Magnesium 300 MG CAPS Take 1,200 mg by mouth daily. 4 caps by mouth daily     melatonin 5 MG TABS Take 1 tablet (5 mg total) by mouth at bedtime as needed. 30 tablet 1   mometasone (NASONEX) 50 MCG/ACT nasal spray 2 sprays each nostril daily during your allergy seasons. 17 g 11   Multiple Vitamin (MULTI-VITAMIN DAILY PO) Take by mouth.     Omega-3 Fatty Acids (FISH OIL) 1000 MG CAPS Take by mouth.     THEANINE PO Take 1,000 mg by mouth. Every other day     triamcinolone cream (KENALOG) 0.1 % Apply twice daily to affected areas as needed. 80 g 3   Tyrosine  500 MG TABS Take 1 tablet by mouth every other day. Every other day     No current facility-administered medications for this visit.    Musculoskeletal: Strength & Muscle Tone: within normal limits Gait & Station: normal Patient leans: N/A  Psychiatric Specialty Exam: Review of Systems  Psychiatric/Behavioral:  Positive for decreased concentration. Negative for dysphoric mood, hallucinations, self-injury, sleep disturbance and suicidal ideas. The patient is nervous/anxious. The patient is not hyperactive.     Blood pressure (!) 165/99, pulse 73, temperature 98.2 F (36.8 C), temperature source Oral, height 6\' 5"  (1.956 m), weight 218 lb (98.9 kg), SpO2 99%.Body mass index is 25.85 kg/m.  General Appearance: Casual  Eye Contact:  Good  Speech:  Clear and Coherent and Normal Rate  Volume:  Normal  Mood:  Anxious  Affect:  Appropriate  Thought Process:  Coherent, Goal Directed, and Descriptions of Associations: Intact  Orientation:  Full (Time, Place, and Person)  Thought Content:  WDL  Suicidal Thoughts:  No  Homicidal Thoughts:  No  Memory:  Immediate;   Good Recent;   Good Remote;   Good  Judgement:  Good  Insight:  Good  Psychomotor Activity:  Normal  Concentration:  Concentration: Good and Attention Span: Good  Recall:  Good  Fund of Knowledge:Good  Language: Good  Akathisia:  No  Handed:  Left  AIMS (if indicated):  not done  Assets:  Communication Skills Desire for Improvement Housing Social Support Vocational/Educational  ADL's:  Intact  Cognition: WNL  Sleep:  Good   Screenings: AIMS    Flowsheet Row [facility]  AIMS Total Score 0      AUDIT    Flowsheet Row [facility] Ad[facility] ([facility]  Alcohol Use Disorder Identification Test Final  Score (AUDIT) 6 2      CAGE-AID    Flowsheet Row ED to Hosp-Admission (Discharged) from 07/20/2020 in [facility]  SURGICAL  CAGE-AID Score 2      GAD-7    Flowsheet Row Office Visit from 02/15/2024 in [facility] Clinical Support from 06/09/2022 in [facility] Counselor from 10/21/2021 in [facility] Video Visit from 09/01/2021 in [facility] Video Visit from 06/30/2021 in [facility]  Total GAD-7 Score 5 2 8 13 5       PHQ2-9    Flowsheet Row Office Visit from 02/15/2024 in [facility] Counselor from 04/13/2023 in [facility] Clinical Support from 06/09/2022 in [facility] Counselor from 10/21/2021 in [facility] Video Visit from 09/01/2021 in [facility]  PHQ-2 Total Score 1 0 0 2 2  PHQ-9 Total Score -- 0 6 6 13       Flowsheet Row Office Visit from 02/15/2024 in [facility] Most recent reading at 02/15/2024 10:19 AM ED from 02/01/2023 in [facility] Emergency Department at [facility] Most recent reading at 02/01/2023  8:24 PM ED from 02/01/2023 in [facility] [place] Most recent reading at 02/01/2023  7:28 PM  C-SSRS RISK CATEGORY No Risk No Risk No Risk       Assessment and Plan:   Primo Innis is a 38 year old male with a past psychiatric history significant for bipolar disorder and attention deficit hyperactivity disorder (predominantly inattentive type) who presents to [facility] to reestablish psychiatric care and for medication management.  Patient was last seen by Toy Cookey, NP on 06/09/2022.  Patient presents to the encounter requesting to be placed back on methylphenidate for the  management of his ADHD.  Patient has a history of being prescribed methylphenidate by Zena Amos, MD and states that it was effective in managing his symptoms.  During his last encounter with Toy Cookey, NP, patient was prescribed Strattera 40 mg daily; however, patient reports that he never used the medication.  Patient continues to endorse several symptoms related to  ADHD.  Provider recommended patient be placed back on Strattera 40 mg daily for the management of his ADHD symptoms.  Although patient was hesitant to start Strattera, patient eventually agreed.  Patient's medication to be e-prescribed to pharmacy of choice.  Collaboration of Care: Medication Management AEB provider managing patient's psychiatric medications, Psychiatrist AEB patient being followed by a mental health provider, and Referral or follow-up with counselor/therapist AEB patient being seen by a licensed clinical social worker at this facility.  Patient/Guardian was advised Release of Information must be obtained prior to any record release in order to collaborate their care with an outside provider. Patient/Guardian was advised if they have not already done so to contact the registration department to sign all necessary forms in order for [place] to release information regarding their care.   Consent: Patient/Guardian gives verbal consent for treatment and assignment of benefits for services provided during this visit. Patient/Guardian expressed understanding and agreed to proceed.   1. Attention deficit hyperactivity disorder (ADHD), predominantly inattentive type (Primary)  - atomoxetine (STRATTERA) 40 MG capsule; Take 1 capsule (40 mg total) by mouth daily.  Dispense: 30 capsule; Refill: 1  Patient to follow up in 6 weeks Provider spent a total of 58 minutes with the patient/reviewing the patient's chart  Meta Hatchet, PA 3/5/20252:01 PM

## 2024-02-18 ENCOUNTER — Encounter (HOSPITAL_COMMUNITY): Payer: Self-pay | Admitting: Physician Assistant
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
## 2024-02-20 ENCOUNTER — Telehealth: Payer: Self-pay

## 2024-02-20 ENCOUNTER — Other Ambulatory Visit: Payer: Self-pay

## 2024-02-20 ENCOUNTER — Ambulatory Visit: Payer: Self-pay

## 2024-02-20 DIAGNOSIS — M542 Cervicalgia: Secondary | ICD-10-CM

## 2024-02-20 DIAGNOSIS — M25512 Pain in left shoulder: Secondary | ICD-10-CM

## 2024-02-20 NOTE — Therapy (Signed)
 Patient Name: John [name] MRN: [id number] DOB:03/21/1986, 38 y.o., male Today's Date: 02/20/2024  Pt walked in today to be scheduled for physical therapy at front desk. Pt was accommodated as walk in PT eval as there was an active referral in the system. Patient was asked if he was going through [employer] comp or [employer]. Patient wanted PT to go through worker's comp. Pt reports that he finished PT with another location which helped with ROM but still has diff with ROM and pain in left arm. Pt is left handed dominant.  Pt also was showing EMG referral that he had for work comp MD. Pt was advised to walk next door to [facility] to schedule his EMG testing (as there was no "Referred to" department on his EMG referral) after his appt today. Pt's PT referral had [place] [facility]. Pt didn't have any worker's [employer] information with him so we were unable to verify if he would be approved for PT this time. Patient was provided [employer] pro bon[facility] PT clinic's phone number to call and schedule his PT appt and he was printed PT referral to take with him. Pt was also given our contact info if he wishes to go through traditional PT with [place].    Ileana Ladd, PT 02/20/2024, 10:39 AM
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
## 2024-02-22 ENCOUNTER — Ambulatory Visit (INDEPENDENT_AMBULATORY_CARE_PROVIDER_SITE_OTHER): Admitting: Physician Assistant

## 2024-02-22 VITALS — BP 151/105 | HR 83 | Temp 98.5°F | Ht 77.0 in | Wt 215.6 lb

## 2024-02-22 DIAGNOSIS — F900 Attention-deficit hyperactivity disorder, predominantly inattentive type: Secondary | ICD-10-CM | POA: Diagnosis not present

## 2024-02-22 DIAGNOSIS — F411 Generalized anxiety disorder: Secondary | ICD-10-CM | POA: Diagnosis not present

## 2024-02-22 DIAGNOSIS — F3177 Bipolar disorder, in partial remission, most recent episode mixed: Secondary | ICD-10-CM | POA: Diagnosis not present

## 2024-02-22 NOTE — Progress Notes (Unsigned)
 [facility] MD/PA/NP OP Progress Note  02/22/2024 9:25 AM John [name]  MRN:  [id number]  Chief Complaint: No chief complaint on file.  HPI: ***  John [name]  Visit Diagnosis: No diagnosis found.  Past Psychiatric History:  ***  Past Medical History:  Past Medical History:  Diagnosis Date  . ADHD (attention deficit hyperactivity disorder)   . Bipolar 1 disorder (HCC)   . Hypertension   . Seasonal allergies   . Thyroid disease 2017   Unknown what exactly--[place] in 2017    Past Surgical History:  Procedure Laterality Date  . WISDOM TOOTH EXTRACTION      Family Psychiatric History:  ***  Family History:  Family History  Problem Relation Age of Onset  . Hypertension Mother   . Schizophrenia Mother   . Schizophrenia Sister     Social History:  Social History   Socioeconomic History  . Marital status: Single    Spouse name: Not on file  . Number of children: 0  . Years of education: 63  . Highest education level: Some college, no degree  Occupational History  . Occupation: Biomedical scientist at [employer]  Tobacco Use  . Smoking status: Former    Current packs/day: 0.00    Average packs/day: 0.3 packs/day for 8.0 years (2.0 ttl pk-yrs)    Types: Cigarettes    Start date: 05/02/2011    Quit date: 05/02/2019    Years since quitting: 4.8    Passive exposure: Past  . Smokeless tobacco: Never  Vaping Use  . Vaping status: Former  Substance and Sexual Activity  . Alcohol use: Yes    Comment: 1-2 drinks weekly  . Drug use: Not Currently    Types: Marijuana    Comment: None since 2022.  Marland Kitchen Sexual activity: Not Currently  Other Topics Concern  . Not on file  Social History Narrative   Lives alone   Working with [facility] for some financial support.   No significant other at this time.   Social Drivers of Corporate investment banker Strain: Low Risk  (10/06/2023)   Overall Financial Resource Strain (CARDIA)   . Difficulty of Paying Living Expenses: Not very  hard  Food Insecurity: Food Insecurity Present (10/06/2023)   Hunger Vital Sign   . Worried About Programme researcher, broadcasting/film/video in the Last Year: Sometimes true   . Ran Out of Food in the Last Year: Sometimes true  Transportation Needs: No Transportation Needs (10/06/2023)   PRAPARE - Transportation   . Lack of Transportation (Medical): No   . Lack of Transportation (Non-Medical): No  Physical Activity: Sufficiently Active (10/02/2019)   Exercise Vital Sign   . Days of Exercise per Week: 7 days   . Minutes of Exercise per Session: 120 min  Stress: No Stress Concern Present (10/02/2019)   [employer] of Occupational Health - Occupational Stress Questionnaire   . Feeling of Stress : Not at all  Social Connections: Not on file    Allergies: No Known Allergies  Metabolic Disorder Labs: Lab Results  Component Value Date   HGBA1C 5.4 10/02/2019   No results found for: "PROLACTIN" Lab Results  Component Value Date   CHOL 160 10/11/2023   TRIG 48 10/11/2023   HDL 48 10/11/2023   CHOLHDL 2.6 07/26/2013   VLDL 8 07/26/2013   LDLCALC 102 (H) 10/11/2023   LDLCALC 75 08/18/2022   Lab Results  Component Value Date   TSH 4.370 10/02/2019   TSH 4.457 07/26/2013  Therapeutic Level Labs: No results found for: "LITHIUM" Lab Results  Component Value Date   VALPROATE 19 (L) 08/02/2020   VALPROATE <10 (L) 02/24/2019   No results found for: "CBMZ"  Current Medications: Current Outpatient Medications  Medication Sig Dispense Refill  . amLODipine (NORVASC) 5 MG tablet Take 1 tablet (5 mg total) by mouth daily. 90 tablet 3  . Ascorbic Acid (VITAMIN C) 1000 MG tablet Take 1,000 mg by mouth daily.    . Ashwagandha 125 MG CAPS Take 125 mg by mouth every other day. Two caps every other day    . atomoxetine (STRATTERA) 40 MG capsule Take 1 capsule (40 mg total) by mouth daily. 30 capsule 1  . B Complex Vitamins (VITAMIN B COMPLEX) CAPS Take 250 capsules by mouth daily.    Marland Kitchen loratadine  (CLARITIN) 10 MG tablet Take 10 mg by mouth as needed for allergies.    . Magnesium 300 MG CAPS Take 1,200 mg by mouth daily. 4 caps by mouth daily    . melatonin 5 MG TABS Take 1 tablet (5 mg total) by mouth at bedtime as needed. 30 tablet 1  . mometasone (NASONEX) 50 MCG/ACT nasal spray 2 sprays each nostril daily during your allergy seasons. 17 g 11  . Multiple Vitamin (MULTI-VITAMIN DAILY PO) Take by mouth.    . Omega-3 Fatty Acids (FISH OIL) 1000 MG CAPS Take by mouth.    . THEANINE PO Take 1,000 mg by mouth. Every other day    . triamcinolone cream (KENALOG) 0.1 % Apply twice daily to affected areas as needed. 80 g 3  . Tyrosine 500 MG TABS Take 1 tablet by mouth every other day. Every other day     No current facility-administered medications for this visit.     Musculoskeletal: Strength & Muscle Tone: within normal limits Gait & Station: normal Patient leans: N/A  Psychiatric Specialty Exam: Review of Systems  There were no vitals taken for this visit.There is no height or weight on file to calculate BMI.  General Appearance: Casual  Eye Contact:  Good  Speech:  Clear and Coherent and Normal Rate  Volume:  Normal  Mood:  Anxious  Affect:  {Affect (PAA):22687}  Thought Process:  {Thought Process (PAA):22688}  Orientation:  {BHH ORIENTATION (PAA):22689}  Thought Content: {Thought Content:22690}   Suicidal Thoughts:  {ST/HT (PAA):22692}  Homicidal Thoughts:  {ST/HT (PAA):22692}  Memory:  {BHH MEMORY:22881}  Judgement:  {Judgement (PAA):22694}  Insight:  {Insight (PAA):22695}  Psychomotor Activity:  {Psychomotor (PAA):22696}  Concentration:  {Concentration:21399}  Recall:  {BHH GOOD/FAIR/POOR:22877}  Fund of Knowledge: {BHH GOOD/FAIR/POOR:22877}  Language: {BHH GOOD/FAIR/POOR:22877}  Akathisia:  {BHH YES OR NO:22294}  Handed:  {Handed:22697}  AIMS (if indicated): {Desc; done/not:10129}  Assets:  {Assets (PAA):22698}  ADL's:  {BHH ZOX'W:96045}  Cognition: {chl bhh  cognition:304700322}  Sleep:  {BHH GOOD/FAIR/POOR:22877}   Screenings: AIMS    Flowsheet Row [facility]) [facility]  AIMS Total Score 0      AUDIT    Flowsheet Row [facility]) [facility] ([facility]  Alcohol Use Disorder Identification Test Final Score (AUDIT) 6 2      CAGE-AID    Flowsheet Row ED to Hosp-Admission (Discharged) from 07/20/2020 in [facility]  SURGICAL  CAGE-AID Score 2      GAD-7    Flowsheet Row Office Visit  from 02/15/2024 in [facility] Clinical Support from 06/09/2022 in [facility] Counselor from 10/21/2021 in [facility] Video Visit from 09/01/2021 in [facility] Video Visit from 06/30/2021 in [facility]  Total GAD-7 Score 5 2 8 13 5       PHQ2-9    Flowsheet Row Office Visit from 02/15/2024 in [facility] Counselor from 04/13/2023 in [facility] Clinical Support from 06/09/2022 in [facility] Counselor from 10/21/2021 in [facility] Video Visit from 09/01/2021 in [facility]  PHQ-2 Total Score 1 0 0 2 2  PHQ-9 Total Score -- 0 6 6 13       Flowsheet Row Office Visit from 02/15/2024 in [facility] Most recent reading at 02/15/2024 10:19 AM ED from 02/01/2023 in [facility] at [facility] Most recent reading at 02/01/2023  8:24 PM ED from 02/01/2023 in [facility] [place] Most recent reading at 02/01/2023  7:28 PM  C-SSRS RISK CATEGORY No Risk No Risk No Risk        Assessment and Plan:  ***  Collaboration of Care: Collaboration of Care: [facility] OP Collaboration of Care:[id number]}  Patient/Guardian was advised Release of Information must be obtained prior to any record release in order to collaborate their care with an outside provider. Patient/Guardian was advised if they have not already done so to contact the registration department to sign all necessary forms in order for [place] to release information regarding their care.   Consent: Patient/Guardian gives verbal consent for treatment and assignment of benefits for services provided during this visit. Patient/Guardian expressed understanding and agreed to proceed.    Meta Hatchet, PA 02/22/2024, 9:25 AM

## 2024-02-23 ENCOUNTER — Encounter (HOSPITAL_COMMUNITY): Payer: Self-pay | Admitting: Physician Assistant

## 2024-03-08 ENCOUNTER — Ambulatory Visit (INDEPENDENT_AMBULATORY_CARE_PROVIDER_SITE_OTHER): Payer: MEDICAID | Admitting: Clinical

## 2024-03-08 DIAGNOSIS — F900 Attention-deficit hyperactivity disorder, predominantly inattentive type: Secondary | ICD-10-CM | POA: Diagnosis not present

## 2024-03-09 NOTE — Progress Notes (Signed)
   THERAPIST PROGRESS NOTE Virtual Visit via Video Note  I connected with John [name] on 03/08/2024 at 11:00 AM EDT by a video enabled telemedicine application and verified that I am speaking with the correct person using two identifiers.  Location: Patient: home Provider: office   I discussed the limitations of evaluation and management by telemedicine and the availability of in person appointments. The patient expressed understanding and agreed to proceed.   Follow Up Instructions: I discussed the assessment and treatment plan with the patient. The patient was provided an opportunity to ask questions and all were answered. The patient agreed with the plan and demonstrated an understanding of the instructions.   The patient was advised to call back or seek an in-person evaluation if the symptoms worsen or if the condition fails to improve as anticipated.   Session Time: 60 minutes  Participation Level: Active  Behavioral Response: CasualAlertEuthymic  Type of Therapy: Individual Therapy  Treatment Goals addressed: John [name] WILL IDENTIFY COGNITIVE PATTERNS AND BELIEFS THAT INTERFERE WITH THERAPY ONCE PER SESSION   ProgressTowards Goals: Progressing  Interventions: CBT  Summary:  John [name] is a 38 y.o. male who presents for scheduled appointment oriented x 5, appropriately dressed, and friendly.  Client denied hallucinations and delusions. Client reported on today he is doing pretty okay.  Client reported he is still doing a lot of reflection and over his interactions with others in the workplace and outside in social settings.  Client reported he has noticed that even members of his family seem to treat him differently at times.  Client reported he believes it can be stem to them acting negatively towards him because of how they are intermittently comparing themselves to him.  Client reported he feels that to be adequate because he does not meet people with that kind of  character. Client reported he is thinking about his career options as well.  Evidence of progress towards goal:  client reported 1 positive of continuing to process negative emotions constructively and evaluating his responses in negative interactions.   Suicidal/Homicidal: Nowithout intent/plan  Therapist Response:  Therapist began the appointment to ask the client how he has been doing. Therapist engaged using active listening and positive emotional support. Therapist used cbt to normalize his thoughts and emotions. Therapist used cbt to offer positive reinforcement for healthy coping skills. Therapist used CBT ask the client to identify his progress with frequency of use with coping skills with continued practice in his daily activity.    Therapist assigned the client homework to practice self care.  Plan: Return again in 4 weeks.  Diagnosis: ADHD, inattentive  Collaboration of Care: Patient refused AEB none requested by the client.  Patient/Guardian was advised Release of Information must be obtained prior to any record release in order to collaborate their care with an outside provider. Patient/Guardian was advised if they have not already done so to contact the registration department to sign all necessary forms in order for [place] to release information regarding their care.   Consent: Patient/Guardian gives verbal consent for treatment and assignment of benefits for services provided during this visit. Patient/Guardian expressed understanding and agreed to proceed.   Neena Rhymes Simmone Cape, LCSW 03/08/2024

## 2024-03-28 ENCOUNTER — Ambulatory Visit (HOSPITAL_COMMUNITY): Payer: MEDICAID | Admitting: Physician Assistant

## 2024-03-28 ENCOUNTER — Encounter (HOSPITAL_COMMUNITY): Payer: Self-pay | Admitting: Physician Assistant

## 2024-03-28 VITALS — BP 152/98 | HR 76 | Temp 98.3°F | Ht 77.0 in | Wt 220.4 lb

## 2024-03-28 DIAGNOSIS — Z758 Other problems related to medical facilities and other health care: Secondary | ICD-10-CM

## 2024-03-28 DIAGNOSIS — F411 Generalized anxiety disorder: Secondary | ICD-10-CM | POA: Diagnosis not present

## 2024-03-28 DIAGNOSIS — F3177 Bipolar disorder, in partial remission, most recent episode mixed: Secondary | ICD-10-CM

## 2024-03-28 DIAGNOSIS — F900 Attention-deficit hyperactivity disorder, predominantly inattentive type: Secondary | ICD-10-CM | POA: Diagnosis not present

## 2024-03-28 NOTE — Progress Notes (Signed)
 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 Patient is not currently on any psychiatric medications at this time.  Patient presents to the encounter stating that he is doing ok.  Patient is still interested in being placed on methylphenidate  for the management of his ADHD symptoms.  He reports that he has tried atomoxetine  but it does not help with his inattentiveness and impulsivity.  He reports that he feels more like himself when on methylphenidate .  Since being without his methylphenidate , patient reports that he has been trying to control factors in his life to help prevent brain fog such as dieting and exercising.  Patient denies experiencing any manic symptoms at this time.  Patient denies overt depressive symptoms and states that he does experience anxiety when he feels overstimulated.  Patient reports that he has been utilizing coping skills to help with managing his anxiety.  A PHQ-9 screen was performed with the patient scoring a 0.  A GAD-7 screen was also performed with the patient scoring a 6.  Patient is alert and oriented x 4, calm, cooperative, and fully engaged in conversation during the encounter.  Patient describes his mood as chill.  Patient exhibits euthymic mood with appropriate affect.  Patient denies suicidal or homicidal ideations.  He further denies auditory or visual hallucinations and does not appear to be responding to internal/external stimuli.  Patient endorses good sleep and receives on average 7 hours of sleep per night.  Patient endorses  good appetite and eats on average 3 meals per day.  Patient denies alcohol consumption, tobacco use, or illicit drug use.  Visit Diagnosis:    ICD-10-CM   1. Attention deficit hyperactivity disorder (ADHD), predominantly inattentive type  F90.0     2. GAD (generalized anxiety disorder)  F41.1     3. 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  Patient also has been diagnosed with bipolar disorder in the past.  Patient suspects that he has a history of autism.   Patient denies a past history of hospitalization due to mental health  - Per chart review, patient has had several hospitalizations due to mental health - Per chart review, patient's most recent hospitalization occurred on 03/01/2019.  Since his last hospitalization, patient has been seen several times in the ED as well as [facility]   Patient denies a past history of suicide attempt   Patient denies a past history of homicide attempt  Past Medical History:  Past Medical History:  Diagnosis Date   ADHD (attention deficit hyperactivity disorder)    Bipolar 1 disorder (HCC)    Hypertension    Seasonal allergies    Thyroid disease 2017   Unknown what exactly--[place] in 2017    Past Surgical History:  Procedure Laterality Date   WISDOM TOOTH EXTRACTION      Family Psychiatric History:  Mother - schizophrenia Sister - schizophrenia   Family history of suicide attempt: Patient denies Family history  of homicide attempt: Patient denies Family history of substance abuse: Patient denies  Family History:  Family History  Problem Relation Age of Onset   Hypertension Mother    Schizophrenia Mother    Schizophrenia Sister     Social History:  Social History   Socioeconomic History   Marital status:  Single    Spouse name: Not on file   Number of children: 0   Years of education: 15   Highest education level: Some college, no degree  Occupational History   Occupation: Biomedical scientist at [employer]  Tobacco Use   Smoking status: Former    Current packs/day: 0.00    Average packs/day: 0.3 packs/day for 8.0 years (2.0 ttl pk-yrs)    Types: Cigarettes    Start date: 05/02/2011    Quit date: 05/02/2019    Years since quitting: 4.9    Passive exposure: Past   Smokeless tobacco: Never  Vaping Use   Vaping status: Former  Substance and Sexual Activity   Alcohol use: Yes    Comment: 1-2 drinks weekly   Drug use: Not Currently    Types: Marijuana    Comment: None since 2022.   Sexual activity: Not Currently  Other Topics Concern   Not on file  Social History Narrative   Lives alone   Working with [employer] for some financial support.   No significant other at this time.   Social Drivers of Corporate investment banker Strain: Low Risk  (10/06/2023)   Overall Financial Resource Strain (CARDIA)    Difficulty of Paying Living Expenses: Not very hard  Food Insecurity: Food Insecurity Present (10/06/2023)   Hunger Vital Sign    Worried About Running Out of Food in the Last Year: Sometimes true    Ran Out of Food in the Last Year: Sometimes true  Transportation Needs: No Transportation Needs (10/06/2023)   PRAPARE - Administrator, Civil Service (Medical): No    Lack of Transportation (Non-Medical): No  Physical Activity: Sufficiently Active (10/02/2019)   Exercise Vital Sign    Days of Exercise per Week: 7 days    Minutes of Exercise per Session: 120 min  Stress: No Stress Concern Present (10/02/2019)   [employer] of Occupational Health - Occupational Stress Questionnaire    Feeling of Stress : Not at all  Social Connections: Not on file    Allergies: No Known Allergies  Metabolic Disorder Labs: Lab Results  Component Value Date   HGBA1C 5.4  10/02/2019   No results found for: "PROLACTIN" Lab Results  Component Value Date   CHOL 160 10/11/2023   TRIG 48 10/11/2023   HDL 48 10/11/2023   CHOLHDL 2.6 07/26/2013   VLDL 8 07/26/2013   LDLCALC 102 (H) 10/11/2023   LDLCALC 75 08/18/2022   Lab Results  Component Value Date   TSH 4.370 10/02/2019   TSH 4.457 07/26/2013    Therapeutic Level Labs: No results found for: "LITHIUM" Lab Results  Component Value Date   VALPROATE 19 (L) 08/02/2020   VALPROATE <10 (L) 02/24/2019   No results found for: "CBMZ"  Current Medications: Current Outpatient Medications  Medication Sig Dispense Refill   amLODipine  (NORVASC ) 5 MG tablet Take 1 tablet (5 mg total) by mouth daily. 90 tablet 3   Ascorbic Acid (VITAMIN C) 1000 MG tablet Take 1,000 mg by mouth daily.     Ashwagandha 125 MG CAPS Take 125 mg by mouth every other day. Two caps every other  day     atomoxetine  (STRATTERA ) 40 MG capsule Take 1 capsule (40 mg total) by mouth daily. 30 capsule 1   B Complex Vitamins (VITAMIN B COMPLEX) CAPS Take 250 capsules by mouth daily.     loratadine  (CLARITIN ) 10 MG tablet Take 10 mg by mouth as needed for allergies.     Magnesium  300 MG CAPS Take 1,200 mg by mouth daily. 4 caps by mouth daily     melatonin 5 MG TABS Take 1 tablet (5 mg total) by mouth at bedtime as needed. 30 tablet 1   mometasone  (NASONEX ) 50 MCG/ACT nasal spray 2 sprays each nostril daily during your allergy seasons. 17 g 11   Multiple Vitamin (MULTI-VITAMIN DAILY PO) Take by mouth.     Omega-3 Fatty Acids (FISH OIL) 1000 MG CAPS Take by mouth.     THEANINE PO Take 1,000 mg by mouth. Every other day     triamcinolone  cream (KENALOG ) 0.1 % Apply twice daily to affected areas as needed. 80 g 3   Tyrosine 500 MG TABS Take 1 tablet by mouth every other day. Every other day     No current facility-administered medications for this visit.     Musculoskeletal: Strength & Muscle Tone: within normal limits Gait & Station:  normal Patient leans: N/A  Psychiatric Specialty Exam: Review of Systems  Psychiatric/Behavioral:  Positive for decreased concentration. Negative for dysphoric mood, hallucinations, self-injury, sleep disturbance and suicidal ideas. The patient is not nervous/anxious and is not hyperactive.     Blood pressure (!) 152/98, pulse 76, temperature 98.3 F (36.8 C), temperature source Oral, height 6\' 5"  (1.956 m), weight 220 lb 6.4 oz (100 kg), SpO2 100%.Body mass index is 26.14 kg/m.  General Appearance: Casual  Eye Contact:  Good  Speech:  Clear and Coherent and Normal Rate  Volume:  Normal  Mood:  Euthymic  Affect:  Appropriate  Thought Process:  Coherent, Goal Directed, and Descriptions of Associations: Intact  Orientation:  Full (Time, Place, and Person)  Thought Content: WDL   Suicidal Thoughts:  No  Homicidal Thoughts:  No  Memory:  Immediate;   Good Recent;   Good Remote;   Good  Judgement:  Good  Insight:  Good  Psychomotor Activity:  Normal  Concentration:  Concentration: Good and Attention Span: Good  Recall:  Good  Fund of Knowledge: Good  Language: Good  Akathisia:  No  Handed:  Left  AIMS (if indicated): not done  Assets:  Communication Skills Desire for Improvement Housing Social Support Vocational/Educational  ADL's:  Intact  Cognition: WNL  Sleep:  Good   Screenings: AIMS    Flowsheet Row [facility]  AIMS Total Score 0      AUDIT    Flowsheet Row [facility] ([facility]  Alcohol Use Disorder Identification Test Final Score (AUDIT) 6 2      CAGE-AID    Flowsheet Row ED to Hosp-Admission (Discharged) from 07/20/2020 in [facility]  SURGICAL  CAGE-AID Score 2      GAD-7    Flowsheet Row Clinical  Support from 03/28/2024 in [facility] Clinical Support from 02/22/2024 in [facility] Office Visit from 02/15/2024 in [facility] Clinical Support from 06/09/2022 in [facility] Counselor from 10/21/2021  in [facility]  Total GAD-7 Score 6 0 5 2 8       PHQ2-9    Flowsheet Row Clinical Support from 03/28/2024 in [facility] Clinical Support from 02/22/2024 in [facility] Office Visit from 02/15/2024 in [facility] Counselor from 04/13/2023 in [facility] Clinical Support from 06/09/2022 in [facility]  PHQ-2 Total Score 0 2 1 0 0  PHQ-9 Total Score -- 8 -- 0 6      Flowsheet Row Clinical Support from 03/28/2024 in [facility] Clinical Support from 02/22/2024 in [facility] Office Visit from 02/15/2024 in [facility]  C-SSRS RISK CATEGORY No Risk No Risk No Risk        Assessment and Plan:   Muzammil Bruins is a 38 year old male with a past psychiatric history significant for bipolar 1 disorder, generalized anxiety disorder, and 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  Patient presents to the encounter requesting to be placed back on methylphenidate .  He reports that his use of atomoxetine  has not been effective in managing his symptoms of ADHD such as impulsivity and inattentiveness.  He reports that his past use of methylphenidate  made him feel more like himself.  PDMP was reviewed with patient not receiving any controlled substances at this time.  Provider will discuss with attending about placing  patient on methylphenidate  for the management of his ADHD symptoms.  Provider to obtain a urine drug screen from the patient prior to placing patient on methylphenidate . - Patient continues to endorse some difficulty paying attention to details, trouble sustaining attention, forgetfulness, being easily distracted, and disorganization  Patient denies experiencing any manic symptoms as of late.  He further denies overt depressive symptoms but does express dealing with some anxiety.  A GAD-7 screen was performed with the patient scoring a 6.  Patient states that he has been utilizing coping skills to help manage his anxiety.  While obtaining vitals, patient's blood pressure was found to be elevated.  Provider to set patient up with a primary care provider.  Collaboration of Care: Collaboration of Care: Psychiatrist AEB patient being followed by a mental health provider at this facility and Referral or follow-up with counselor/therapist AEB patient being seen by licensed clinical social worker at this facility  Patient/Guardian was advised Release of Information must be obtained prior to any record release in order to collaborate their care with an outside provider. Patient/Guardian was advised if they have not already done so to contact the registration department to sign all necessary forms in order for us  to release information regarding their care.   Consent: Patient/Guardian gives verbal consent for treatment and assignment of benefits for services provided during this visit. Patient/Guardian expressed understanding and agreed to proceed.   1. Attention deficit hyperactivity disorder (ADHD), predominantly inattentive type (Primary) Provider to reach out to attending to discuss medication options for patient's ADHD Patient has a history of taking methylphenidate  Provider to obtain a urine drug screen on the patient  - Urine Drug Panel 7; Future  2. GAD (generalized anxiety disorder)  3. Bipolar  1 disorder, mixed, partial remission (HCC)  4. Does not have primary care provider  - Ambulatory referral to Internal Medicine  Patient to follow up on 03/28/2024 Provider spent a total of 29  minutes with the patient/reviewing patient's chart  Gates Kasal, PA 03/28/2024, 12:02 PM

## 2024-04-04 ENCOUNTER — Encounter (HOSPITAL_COMMUNITY): Payer: Self-pay

## 2024-04-09 ENCOUNTER — Ambulatory Visit: Payer: MEDICAID | Admitting: Internal Medicine

## 2024-04-10 ENCOUNTER — Ambulatory Visit (HOSPITAL_COMMUNITY): Payer: MEDICAID | Admitting: Clinical

## 2024-04-18 ENCOUNTER — Encounter (HOSPITAL_COMMUNITY): Payer: Self-pay

## 2024-04-18 ENCOUNTER — Ambulatory Visit (INDEPENDENT_AMBULATORY_CARE_PROVIDER_SITE_OTHER): Payer: MEDICAID | Admitting: Clinical

## 2024-04-18 DIAGNOSIS — F411 Generalized anxiety disorder: Secondary | ICD-10-CM | POA: Diagnosis not present

## 2024-04-22 NOTE — Progress Notes (Signed)
   THERAPIST PROGRESS NOTE Virtual Visit via Video Note  I connected with John [name] on 04/18/2024 at 11:00 AM EDT by a video enabled telemedicine application and verified that I am speaking with the correct person using two identifiers.  Location: Patient: car Provider: office   I discussed the limitations of evaluation and management by telemedicine and the availability of in person appointments. The patient expressed understanding and agreed to proceed.   Follow Up Instructions: I discussed the assessment and treatment plan with the patient. The patient was provided an opportunity to ask questions and all were answered. The patient agreed with the plan and demonstrated an understanding of the instructions.   The patient was advised to call back or seek an in-person evaluation if the symptoms worsen or if the condition fails to improve as anticipated.   Session Time: 30 minutes  Participation Level: Active  Behavioral Response: CasualAlertAnxious and Irritable  Type of Therapy: Individual Therapy  Treatment Goals addressed: Myrtie Atkinson WILL IDENTIFY COGNITIVE PATTERNS AND BELIEFS THAT INTERFERE WITH THERAPY ONCE PER SESSION   ProgressTowards Goals: Progressing  Interventions: CBT and Supportive  Summary:  John [name] is a 38 y.o. male who presents for the scheduled appointment oriented times five, appropriately dressed and friendly. Client denied hallucinations and delusions. Client reported on today he has a mix of thoughts and emotions. Client reported he is in physical therapy and has a new doctor doing it. Client reported he is pleased with their bedside care and professionalism. Client reported overall he is still dealing with discomfort and is not sure to what extent he will feel better physically. Client reported otherwise he is still working on having clarity in how to intake and respond to others in spite of their behaviors. Client reported he still has interests in career  for helping others. Client reported he is taking the psych medication by the doctor and is doing fine with it. Client reported he will discuss some things with his doctor at the next appointment. Evidence of progress towards goal:  client reported he is medication compliant 7 days per week.  Suicidal/Homicidal: Nowithout intent/plan  Therapist Response:  Therapist began the appointment asking the client how he has been doing. Therapist engaged with active listening and positive emotional support. Therapist used cbt to engage and ask how he is progressing with his physical health. Therapist used cbt to engage and positively reinforce caring for himself and advocating for himself in medical settings. Therapist used CBT ask the client to identify his progress with frequency of use with coping skills with continued practice in his daily activity.      Plan: Return again in 4 weeks.  Diagnosis: GAD  Collaboration of Care: Other therapist addressed client concerns with interactions with the front desk staff and relayed his concern to management.  Patient/Guardian was advised Release of Information must be obtained prior to any record release in order to collaborate their care with an outside provider. Patient/Guardian was advised if they have not already done so to contact the registration department to sign all necessary forms in order for us  to release information regarding their care.   Consent: Patient/Guardian gives verbal consent for treatment and assignment of benefits for services provided during this visit. Patient/Guardian expressed understanding and agreed to proceed.   Santiaga Butzin Y Humzah Harty, LCSW 04/18/2024

## 2024-05-09 ENCOUNTER — Encounter (HOSPITAL_COMMUNITY): Payer: Self-pay | Admitting: Physician Assistant

## 2024-05-09 ENCOUNTER — Ambulatory Visit (INDEPENDENT_AMBULATORY_CARE_PROVIDER_SITE_OTHER): Payer: MEDICAID | Admitting: Physician Assistant

## 2024-05-09 VITALS — BP 157/94 | HR 67 | Temp 98.3°F | Ht 77.0 in | Wt 227.2 lb

## 2024-05-09 DIAGNOSIS — F3177 Bipolar disorder, in partial remission, most recent episode mixed: Secondary | ICD-10-CM

## 2024-05-09 DIAGNOSIS — F411 Generalized anxiety disorder: Secondary | ICD-10-CM

## 2024-05-09 DIAGNOSIS — F900 Attention-deficit hyperactivity disorder, predominantly inattentive type: Secondary | ICD-10-CM

## 2024-05-09 NOTE — Progress Notes (Signed)
 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 Patient is not currently on any psychiatric medications at this time.  Patient presents to the encounter reporting no issues or concerns regarding his mental health.  Patient is currently not taking any psychiatric medications at this time and states that he has been supplementing with a variety of health-related supplements.  Some of the supplements the patient has been utilizing our L-theanine, moringa, L-Tyrosine, ashwaganda, zinc, B12, fish oil, probiotic, and multivitamin.  Patient denies overt depressive symptoms nor does he endorse anxiety.  He still continues to endorse issues with concentration stating that he often places and is easily frustrated.  He finds that he is unable to sit still and often has a difficult time homing in on things he needs to study.  A PHQ-9 screen was performed with the patient scoring a 1.  A GAD-7 screen was also performed with the patient scoring a 7.  Patient is alert and oriented x 4, calm, cooperative, and fully engaged in conversation during the encounter.  Patient describes his mood as chill.  Patient exhibits euthymic mood with appropriate affect.  Patient denies suicidal or homicidal ideations.  He further denies auditory or visual hallucinations and does not appear to be responding to internal/external stimuli.  Patient endorses good sleep and receives on average 6 to 7 hours of sleep per night.  Patient endorses good appetite and eats on average 3 meals per day.  Patient endorses  alcohol consumption on occasion.  Patient denies tobacco use or illicit drug use.  Visit Diagnosis:    ICD-10-CM   1. 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  Patient also has been diagnosed with bipolar disorder in the past.  Patient suspects that he has a history of autism.   Patient denies a past history of hospitalization due to mental health  - Per chart review, patient has had several hospitalizations due to mental health - Per chart review, patient's most recent hospitalization occurred on 03/01/2019.  Since his last hospitalization, patient has been seen several times in the ED as well as [facility]   Patient denies a past history of suicide attempt   Patient denies a past history of homicide attempt  Past Medical History:  Past Medical History:  Diagnosis Date   ADHD (attention deficit hyperactivity disorder)    Bipolar 1 disorder (HCC)    Hypertension    Seasonal allergies    Thyroid disease 2017   Unknown what exactly--[place] in 2017    Past Surgical History:  Procedure Laterality Date   WISDOM TOOTH EXTRACTION      Family Psychiatric History:  Mother - schizophrenia Sister - schizophrenia   Family history of suicide attempt: Patient denies Family history of homicide attempt: Patient denies Family history of substance abuse: Patient denies  Family History:  Family History  Problem Relation Age of Onset   Hypertension Mother  Schizophrenia Mother    Schizophrenia Sister     Social History:  Social History   Socioeconomic History   Marital status: Single    Spouse name: Not on file   Number of children: 0   Years of education: 15   Highest education level: Some college, no degree   Occupational History   Occupation: Biomedical scientist at [employer]  Tobacco Use   Smoking status: Former    Current packs/day: 0.00    Average packs/day: 0.3 packs/day for 8.0 years (2.0 ttl pk-yrs)    Types: Cigarettes    Start date: 05/02/2011    Quit date: 05/02/2019    Years since quitting: 5.0    Passive exposure: Past   Smokeless tobacco: Never  Vaping Use   Vaping status: Former  Substance and Sexual Activity   Alcohol use: Yes    Comment: 1-2 drinks weekly   Drug use: Not Currently    Types: Marijuana    Comment: None since 2022.   Sexual activity: Not Currently  Other Topics Concern   Not on file  Social History Narrative   Lives alone   Working with [facility] for some financial support.   No significant other at this time.   Social Drivers of Corporate investment banker Strain: Low Risk  (10/06/2023)   Overall Financial Resource Strain (CARDIA)    Difficulty of Paying Living Expenses: Not very hard  Food Insecurity: Food Insecurity Present (10/06/2023)   Hunger Vital Sign    Worried About Running Out of Food in the Last Year: Sometimes true    Ran Out of Food in the Last Year: Sometimes true  Transportation Needs: No Transportation Needs (10/06/2023)   PRAPARE - Administrator, Civil Service (Medical): No    Lack of Transportation (Non-Medical): No  Physical Activity: Sufficiently Active (10/02/2019)   Exercise Vital Sign    Days of Exercise per Week: 7 days    Minutes of Exercise per Session: 120 min  Stress: No Stress Concern Present (10/02/2019)   [employer] of Occupational Health - Occupational Stress Questionnaire    Feeling of Stress : Not at all  Social Connections: Not on file    Allergies: No Known Allergies  Metabolic Disorder Labs: Lab Results  Component Value Date   HGBA1C 5.4 10/02/2019   No results found for: "PROLACTIN" Lab Results  Component Value Date   CHOL 160 10/11/2023   TRIG 48 10/11/2023   HDL 48  10/11/2023   CHOLHDL 2.6 07/26/2013   VLDL 8 07/26/2013   LDLCALC 102 (H) 10/11/2023   LDLCALC 75 08/18/2022   Lab Results  Component Value Date   TSH 4.370 10/02/2019   TSH 4.457 07/26/2013    Therapeutic Level Labs: No results found for: "LITHIUM" Lab Results  Component Value Date   VALPROATE 19 (L) 08/02/2020   VALPROATE <10 (L) 02/24/2019   No results found for: "CBMZ"  Current Medications: Current Outpatient Medications  Medication Sig Dispense Refill   amLODipine  (NORVASC ) 5 MG tablet Take 1 tablet (5 mg total) by mouth daily. 90 tablet 3   Ascorbic Acid (VITAMIN C) 1000 MG tablet Take 1,000 mg by mouth daily.     Ashwagandha 125 MG CAPS Take 125 mg by mouth every other day. Two caps every other day     atomoxetine  (STRATTERA ) 40 MG capsule Take 1 capsule (40 mg total) by mouth daily. 30 capsule 1   B Complex Vitamins (VITAMIN B COMPLEX) CAPS  Take 250 capsules by mouth daily.     loratadine  (CLARITIN ) 10 MG tablet Take 10 mg by mouth as needed for allergies.     Magnesium  300 MG CAPS Take 1,200 mg by mouth daily. 4 caps by mouth daily     melatonin 5 MG TABS Take 1 tablet (5 mg total) by mouth at bedtime as needed. 30 tablet 1   mometasone  (NASONEX ) 50 MCG/ACT nasal spray 2 sprays each nostril daily during your allergy seasons. 17 g 11   Multiple Vitamin (MULTI-VITAMIN DAILY PO) Take by mouth.     Omega-3 Fatty Acids (FISH OIL) 1000 MG CAPS Take by mouth.     THEANINE PO Take 1,000 mg by mouth. Every other day     triamcinolone  cream (KENALOG ) 0.1 % Apply twice daily to affected areas as needed. 80 g 3   Tyrosine 500 MG TABS Take 1 tablet by mouth every other day. Every other day     No current facility-administered medications for this visit.     Musculoskeletal: Strength & Muscle Tone: within normal limits Gait & Station: normal Patient leans: N/A  Psychiatric Specialty Exam: Review of Systems  Psychiatric/Behavioral:  Positive for decreased concentration.  Negative for dysphoric mood, hallucinations, self-injury, sleep disturbance and suicidal ideas. The patient is not nervous/anxious and is not hyperactive.     Blood pressure (!) 157/94, pulse 67, temperature 98.3 F (36.8 C), temperature source Oral, height 6\' 5"  (1.956 m), weight 227 lb 3.2 oz (103.1 kg), SpO2 100%.Body mass index is 26.94 kg/m.  General Appearance: Casual  Eye Contact:  Good  Speech:  Clear and Coherent and Normal Rate  Volume:  Normal  Mood:  Euthymic  Affect:  Appropriate  Thought Process:  Coherent, Goal Directed, and Descriptions of Associations: Intact  Orientation:  Full (Time, Place, and Person)  Thought Content: WDL   Suicidal Thoughts:  No  Homicidal Thoughts:  No  Memory:  Immediate;   Good Recent;   Good Remote;   Good  Judgement:  Good  Insight:  Good  Psychomotor Activity:  Normal  Concentration:  Concentration: Good and Attention Span: Good  Recall:  Good  Fund of Knowledge: Good  Language: Good  Akathisia:  No  Handed:  Left  AIMS (if indicated): not done  Assets:  Communication Skills Desire for Improvement Housing Social Support Vocational/Educational  ADL's:  Intact  Cognition: WNL  Sleep:  Good   Screenings: AIMS    Flowsheet Row [facility]  AIMS Total Score 0      AUDIT    Flowsheet Row [facility]  Alcohol Use Disorder Identification Test Final Score (AUDIT) 6 2      CAGE-AID    Flowsheet Row ED to Hosp-Admission (Discharged) from 07/20/2020 in [facility]  SURGICAL  CAGE-AID Score 2      GAD-7    Flowsheet Row Clinical Support from 05/09/2024 in [facility] Clinical Support from 03/28/2024 in [facility]  Clinical Support from 02/22/2024 in [facility] Office Visit from 02/15/2024 in [facility] Clinical Support from 06/09/2022 in [facility]  Total GAD-7 Score 7 6 0 5 2      PHQ2-9    Flowsheet Row Clinical Support from 05/09/2024  in [facility] Clinical Support from 03/28/2024 in [facility] Clinical Support from 02/22/2024 in [facility] Office Visit from 02/15/2024 in [facility] Counselor from 04/13/2023 in [facility]  PHQ-2 Total Score 1 0 2 1 0  PHQ-9 Total Score -- -- 8 -- 0      Flowsheet Row Clinical Support from 05/09/2024 in [facility] Clinical Support from 03/28/2024 in [facility] Clinical Support from 02/22/2024 in [facility]  C-SSRS RISK CATEGORY No Risk No Risk No Risk        Assessment and Plan:   John [name] is a 38 year old male with a past psychiatric history significant for bipolar 1 disorder, generalized anxiety disorder, and 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  Patient presents to the encounter reporting no issues or concerns regarding his mood.  Patient is currently not being managed on any psychiatric medications at this time but states that he continues to supplement with over-the-counter health supplements.  Patient does report that he continues to have issues with focus and concentration.  He notes that he has been experiencing pacing and the inability to sit still.  He also reports that he has a difficult time homing in on things that he needs to study.  Patient was informed that before he can be started on a  stimulant, he would need to have a urine drug screen performed.  Patient vocalized understanding.  Patient was instructed to go to [facility] to have his urine drug screen performed.  Once patient's urine drug screen results are in, provider will decide to place patient back on methylphenidate .  Patient denies suicidal ideations and is able to contract for safety following the conclusion of the encounter.  Collaboration of Care: Collaboration of Care: Psychiatrist AEB patient being followed by a mental health provider at this facility and Referral or follow-up with counselor/therapist AEB patient being seen by licensed clinical social worker at this facility  Patient/Guardian was advised Release of Information must be obtained prior to any record release in order to collaborate their care with an outside provider. Patient/Guardian was advised if they have not already done so to contact the registration department to sign all necessary forms in order for us  to release information regarding their care.   Consent: Patient/Guardian gives verbal consent for treatment and assignment of benefits for services provided during this visit. Patient/Guardian expressed understanding and agreed to proceed.   1. Attention deficit hyperactivity disorder (ADHD), predominantly inattentive type Patient has a history of taking methylphenidate  Urine drug screen to be obtained at [facility]  - Urine Drug Panel 7  2. Bipolar 1 disorder, mixed, partial remission (HCC) (Primary)  3. GAD (generalized anxiety disorder)  Patient to follow up in 6 weeks Provider spent a total of 15 minutes with the patient/reviewing patient's chart  John Kasal, PA 05/09/2024, 11:26 AM

## 2024-05-16 ENCOUNTER — Ambulatory Visit (INDEPENDENT_AMBULATORY_CARE_PROVIDER_SITE_OTHER): Payer: MEDICAID | Admitting: Clinical

## 2024-05-16 DIAGNOSIS — F900 Attention-deficit hyperactivity disorder, predominantly inattentive type: Secondary | ICD-10-CM

## 2024-05-20 NOTE — Progress Notes (Signed)
   THERAPIST PROGRESS NOTE  Session Time: 40 minutes  Participation Level: Active  Behavioral Response: CasualAlertEuthymic  Type of Therapy: Individual Therapy  Treatment Goals addressed: Myrtie Atkinson WILL IDENTIFY COGNITIVE PATTERNS AND BELIEFS THAT INTERFERE WITH THERAPY ONCE PER SESSION   ProgressTowards Goals: Progressing  Interventions: CBT and Supportive  Summary:  Anik Wesch is a 38 y.o. male who presents for the scheduled appointment oriented times five, appropriately dressed and friendly. Client denied hallucinations and delusions. Client reported he is doing fairly well today. Client reported his doctor thinks he may needs surgery. Client reported he does not want to do that but his daily activity is affected by his inability to have full range of motion. Client reported otherwise he has been casually dating. Client reported it has been interesting. Client reported he continues to do his own research on ADHD to gain better understanding of himself.  Evidence of progress towards goal:  client reported he is medication compliant 7 days a week.  Suicidal/Homicidal: Nowithout intent/plan  Therapist Response:  Therapist began the appointment asking the client how he has been doing since last seen. Therapist engaged with active listening and positive emotional support. Therapist used cbt to engage and ask the client about changes to his daily activity. Therapist used cbt to ask him about his mood and medication compliance. Therapist used cbt to teach the client about communication skills. Therapist used CBT ask the client to identify his progress with frequency of use with coping skills with continued practice in his daily activity.      Plan: Return again in 4 weeks.  Diagnosis: ADHD, inattentive  Collaboration of Care: Patient refused AEB none requested  Patient/Guardian was advised Release of Information must be obtained prior to any record release in order to collaborate  their care with an outside provider. Patient/Guardian was advised if they have not already done so to contact the registration department to sign all necessary forms in order for us  to release information regarding their care.   Consent: Patient/Guardian gives verbal consent for treatment and assignment of benefits for services provided during this visit. Patient/Guardian expressed understanding and agreed to proceed.   Filomena Pokorney Y Anay Walter, LCSW 05/16/2024

## 2024-05-21 ENCOUNTER — Telehealth: Payer: Self-pay

## 2024-05-21 NOTE — Telephone Encounter (Signed)
 Patient called and asked for an appointment,  Patient was scheduled for Thursday 05/24/24 at 12:30PM.  Patient states he is having eye problems, symptoms are redness and itchiness, Symptoms started the morning of 05/21/24 and has only used eye drops.   Patient would like to know if there is something else he can use before his scheduled appointment on Thursday.

## 2024-05-21 NOTE — Telephone Encounter (Signed)
 Per Doctor Jayne Mews called patient back to ask for more information.   Patient denies other symptoms like fever, cough or congestion however he does states he feels sinus sensitive.  Patient states in the morning he did have discharge and described it looked gunky. Patient states throughout the day it has only been red and itchiness.    Patient would like for medication to go to pharmacy at [facility] on [address].

## 2024-05-23 NOTE — Telephone Encounter (Signed)
 Patient had been scheduled for an acute appointment for 05/24/24 at 12:30 PM,   Patient was prescribed medication for eye problem by doctor , contacted patient to ask if medication has been helping and asked if he still needs appointment.  Patient stated he has not use medication yet, patient states eyes have been feeling dry today. Also,  patient reports he will start medication today and he would like to see if he will get better first, patient states he will call back for appointment if medication does not resolve problem.

## 2024-05-24 ENCOUNTER — Ambulatory Visit: Payer: MEDICAID | Admitting: Internal Medicine

## 2024-05-31 ENCOUNTER — Telehealth (HOSPITAL_COMMUNITY): Payer: Self-pay

## 2024-05-31 NOTE — Telephone Encounter (Signed)
 Error encounter.

## 2024-06-06 ENCOUNTER — Ambulatory Visit (INDEPENDENT_AMBULATORY_CARE_PROVIDER_SITE_OTHER): Payer: MEDICAID | Admitting: Clinical

## 2024-06-06 DIAGNOSIS — F411 Generalized anxiety disorder: Secondary | ICD-10-CM

## 2024-06-06 NOTE — Progress Notes (Signed)
 THERAPIST PROGRESS NOTE Virtual Visit via Video Note  I connected with John [name] on 06/06/24 at 11:00 AM EDT by a video enabled telemedicine application and verified that I am speaking with the correct person using two identifiers.  Location: Patient: home Provider: office   I discussed the limitations of evaluation and management by telemedicine and the availability of in person appointments. The patient expressed understanding and agreed to proceed.   Follow Up Instructions: I discussed the assessment and treatment plan with the patient. The patient was provided an opportunity to ask questions and all were answered. The patient agreed with the plan and demonstrated an understanding of the instructions.   The patient was advised to call back or seek an in-person evaluation if the symptoms worsen or if the condition fails to improve as anticipated.   Session Time: 55 minutes  Participation Level: Active  Behavioral Response: CasualAlertAnxious  Type of Therapy: Individual Therapy  Treatment Goals addressed: John WILL IDENTIFY COGNITIVE PATTERNS AND BELIEFS THAT INTERFERE WITH THERAPY ONCE PER SESSION   ProgressTowards Goals: Progressing  Interventions: CBT and Supportive  Summary:  John [name] is a 38 y.o. male who presents for the scheduled appointment oriented x 5, appropriately dressed, and friendly.  Client denied hallucinations and delusions. Client reported on today he has been recovering from being sick.  Client reported he had pinkeye as well as food poisoning.  Client reported the food poisoning came from some store-bought eggs. Client reported this has ultimately prompted him to ask for testing for celiac disease.  Client reported some of his food sensitivities go back to childhood and he wonders if ultimately it is for a reason of a specific intolerance.  Client reported also the doctors have mentioned that they will want to do surgery because of his lack of  progress on its own of being able to flex his shoulder. Client reported his shoulder injury since his work incident has impacted his ability to engage in activities to his likeness. Client reported he gave up his gym membership because of it.  Client reported overall he is nervous about the procedure and then the amount of time it would take for him to recover.  Client reported also he has been thinking about his daily functioning without medication. Client reported while on medication he was more tolerable of being flexible with a routine not going completely as he picks at it.  Client reported he is now noticing because he is not able to get up and be productive such as going to the gym it has slowed down his productivity for the rest of the day. Client reported he noticed if I do not get things done when I think of it as struggle and I feel stuck. Evidence of progress towards goal: Client reported 1 positive finding alternative engaging behavior activities he can do such as reading, going for walks, and talking to friends.  Suicidal/Homicidal: Nowithout intent/plan  Therapist Response:  Therapist began the appointment asking the client how he has been doing since last seen. Therapist engaged active listening and positive emotional support. Therapist used CBT to give the client time to discuss his thoughts and feelings about his health, his daily functioning, and interaction in his interpersonal relationships. Therapist used CBT to engage with him in discussing the positives out of his mindfulness for what he needs to help him be successful in his daily routine and ways he can improve his communication skills and boundaries. Therapist used CBT ask the client  to identify his progress with frequency of use with coping skills with continued practice in his daily activity.    Therapist assigned client homework to practice self-care.  Plan: Return again in 4 weeks.  Diagnosis: GAD  Collaboration of  Care: Patient refused AEB none requested by the client.  Patient/Guardian was advised Release of Information must be obtained prior to any record release in order to collaborate their care with an outside provider. Patient/Guardian was advised if they have not already done so to contact the registration department to sign all necessary forms in order for us  to release information regarding their care.   Consent: Patient/Guardian gives verbal consent for treatment and assignment of benefits for services provided during this visit. Patient/Guardian expressed understanding and agreed to proceed.   Theo Reither Y Sohil Timko, LCSW 06/06/2024

## 2024-06-08 LAB — URINE DRUG PANEL 7
Amphetamines, Urine: NEGATIVE ng/mL
Barbiturate Quant, Ur: NEGATIVE ng/mL
Benzodiazepine Quant, Ur: NEGATIVE ng/mL
Cannabinoid Quant, Ur: NEGATIVE ng/mL
Cocaine (Metab.): NEGATIVE ng/mL
Creatinine, Urine: 72 mg/dL (ref 20.0–300.0)
Nitrite Urine, Quantitative: NEGATIVE ug/mL
OPIATE SCREEN URINE: NEGATIVE ng/mL
PCP Quant, Ur: NEGATIVE ng/mL
pH, Urine: 5.6 (ref 4.5–8.9)

## 2024-06-20 ENCOUNTER — Ambulatory Visit (INDEPENDENT_AMBULATORY_CARE_PROVIDER_SITE_OTHER): Payer: MEDICAID | Admitting: Physician Assistant

## 2024-06-20 VITALS — BP 161/109 | HR 61 | Ht 77.0 in | Wt 228.8 lb

## 2024-06-20 DIAGNOSIS — F411 Generalized anxiety disorder: Secondary | ICD-10-CM

## 2024-06-20 DIAGNOSIS — F3177 Bipolar disorder, in partial remission, most recent episode mixed: Secondary | ICD-10-CM | POA: Diagnosis not present

## 2024-06-20 DIAGNOSIS — F900 Attention-deficit hyperactivity disorder, predominantly inattentive type: Secondary | ICD-10-CM

## 2024-06-20 MED ORDER — METHYLPHENIDATE HCL 5 MG PO TABS: 5.0000 mg | ORAL_TABLET | Freq: Two times a day (BID) | ORAL | 0 refills | Status: DC

## 2024-06-20 NOTE — Progress Notes (Signed)
 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 Patient is not currently on any psychiatric medications at this time.  Patient presents to the encounter reporting no major issues or concerns at this time.  He reports that he has an upcoming surgery for his left shoulder and elbow.  Patient denies overt depressive symptoms nor does he endorse anxiety.  Besides his issues with focus and concentration, patient denies any other issues at this time. Patient continues to endorse inattentiveness and impulsivity as well as episodes of brain fog.  Patient informed provider that he was able to get a urine drug screen performed with his urine drug screen being negative for any illicit substances.  A PHQ-9 screen was performed with the patient scoring a 1.  A GAD-7 screen was also performed with the patient scoring a 1.  Patient is alert and oriented x 4, calm, cooperative, and fully engaged in conversation during the encounter.  Patient describes his mood as chill.  Patient exhibits euthymic mood with appropriate affect.  Patient denies suicidal or homicidal ideations.  He further denies auditory or visual hallucinations and does not appear to be responding to internal/external stimuli.  Patient endorses good sleep and receives on average 6 to 8 hours of sleep at night.  Patient endorses good appetite and eats on average 2-1/2 meals per day.  Patient denies alcohol consumption, tobacco use, or illicit drug use.  Visit Diagnosis:    ICD-10-CM   1. Attention  deficit hyperactivity disorder (ADHD), predominantly inattentive type  F90.0 methylphenidate  (RITALIN ) 5 MG tablet    2. Bipolar 1 disorder, mixed, partial remission (HCC)  F31.77     3. 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  Patient also has been diagnosed with bipolar disorder in the past.  Patient suspects that he has a history of autism.   Patient denies a past history of hospitalization due to mental health  - Per chart review, patient has had several hospitalizations due to mental health - Per chart review, patient's most recent hospitalization occurred on 03/01/2019.  Since his last hospitalization, patient has been seen several times in the ED as well as [facility]   Patient denies a past history of suicide attempt   Patient denies a past history of homicide attempt  Past Medical History:  Past Medical History:  Diagnosis Date   ADHD (attention deficit hyperactivity disorder)    Bipolar 1 disorder (HCC)    Hypertension    Seasonal allergies    Thyroid disease 2017   Unknown what exactly--[place] in 2017    Past Surgical History:  Procedure Laterality Date   WISDOM TOOTH EXTRACTION      Family Psychiatric History:  Mother - schizophrenia Sister - schizophrenia   Family history of suicide attempt: Patient denies Family history of homicide attempt: Patient denies Family history of substance abuse: Patient denies  Family History:  Family History  Problem Relation Age of  Onset   Hypertension Mother    Schizophrenia Mother    Schizophrenia Sister     Social History:  Social History   Socioeconomic History   Marital status: Single    Spouse name: Not on file   Number of children: 0   Years of education: 15   Highest education level: Some college, no  degree  Occupational History   Occupation: Biomedical scientist at [employer]  Tobacco Use   Smoking status: Former    Current packs/day: 0.00    Average packs/day: 0.3 packs/day for 8.0 years (2.0 ttl pk-yrs)    Types: Cigarettes    Start date: 05/02/2011    Quit date: 05/02/2019    Years since quitting: 5.1    Passive exposure: Past   Smokeless tobacco: Never  Vaping Use   Vaping status: Former  Substance and Sexual Activity   Alcohol use: Yes    Comment: 1-2 drinks weekly   Drug use: Not Currently    Types: Marijuana    Comment: None since 2022.   Sexual activity: Not Currently  Other Topics Concern   Not on file  Social History Narrative   Lives alone   Working with [facility] for some financial support.   No significant other at this time.   Social Drivers of Corporate investment banker Strain: Low Risk  (10/06/2023)   Overall Financial Resource Strain (CARDIA)    Difficulty of Paying Living Expenses: Not very hard  Food Insecurity: Food Insecurity Present (10/06/2023)   Hunger Vital Sign    Worried About Running Out of Food in the Last Year: Sometimes true    Ran Out of Food in the Last Year: Sometimes true  Transportation Needs: No Transportation Needs (10/06/2023)   PRAPARE - Administrator, Civil Service (Medical): No    Lack of Transportation (Non-Medical): No  Physical Activity: Sufficiently Active (10/02/2019)   Exercise Vital Sign    Days of Exercise per Week: 7 days    Minutes of Exercise per Session: 120 min  Stress: No Stress Concern Present (10/02/2019)   [employer] of Occupational Health - Occupational Stress Questionnaire    Feeling of Stress : Not at all  Social Connections: Not on file    Allergies: No Known Allergies  Metabolic Disorder Labs: Lab Results  Component Value Date   HGBA1C 5.4 10/02/2019   No results found for: PROLACTIN Lab Results  Component Value Date   CHOL 160 10/11/2023   TRIG 48 10/11/2023   HDL 48  10/11/2023   CHOLHDL 2.6 07/26/2013   VLDL 8 07/26/2013   LDLCALC 102 (H) 10/11/2023   LDLCALC 75 08/18/2022   Lab Results  Component Value Date   TSH 4.370 10/02/2019   TSH 4.457 07/26/2013    Therapeutic Level Labs: No results found for: LITHIUM Lab Results  Component Value Date   VALPROATE 19 (L) 08/02/2020   VALPROATE <10 (L) 02/24/2019   No results found for: CBMZ  Current Medications: Current Outpatient Medications  Medication Sig Dispense Refill   methylphenidate  (RITALIN ) 5 MG tablet Take 1 tablet (5 mg total) by mouth 2 (two) times daily with breakfast and lunch. 60 tablet 0   amLODipine  (NORVASC ) 5 MG tablet Take 1 tablet (5 mg total) by mouth daily. 90 tablet 3   Ascorbic Acid (VITAMIN C) 1000 MG tablet Take 1,000 mg by mouth daily.     Ashwagandha 125 MG CAPS Take 125 mg by mouth every other day. Two  caps every other day     atomoxetine  (STRATTERA ) 40 MG capsule Take 1 capsule (40 mg total) by mouth daily. 30 capsule 1   B Complex Vitamins (VITAMIN B COMPLEX) CAPS Take 250 capsules by mouth daily.     loratadine  (CLARITIN ) 10 MG tablet Take 10 mg by mouth as needed for allergies.     Magnesium  300 MG CAPS Take 1,200 mg by mouth daily. 4 caps by mouth daily     melatonin 5 MG TABS Take 1 tablet (5 mg total) by mouth at bedtime as needed. 30 tablet 1   mometasone  (NASONEX ) 50 MCG/ACT nasal spray 2 sprays each nostril daily during your allergy seasons. 17 g 11   Multiple Vitamin (MULTI-VITAMIN DAILY PO) Take by mouth.     Omega-3 Fatty Acids (FISH OIL) 1000 MG CAPS Take by mouth.     THEANINE PO Take 1,000 mg by mouth. Every other day     triamcinolone  cream (KENALOG ) 0.1 % Apply twice daily to affected areas as needed. 80 g 3   Tyrosine 500 MG TABS Take 1 tablet by mouth every other day. Every other day     No current facility-administered medications for this visit.     Musculoskeletal: Strength & Muscle Tone: within normal limits Gait & Station:  normal Patient leans: N/A  Psychiatric Specialty Exam: Review of Systems  Psychiatric/Behavioral:  Positive for decreased concentration. Negative for dysphoric mood, hallucinations, self-injury, sleep disturbance and suicidal ideas. The patient is not nervous/anxious and is not hyperactive.     Blood pressure (!) 161/109, pulse 61, height 6' 5 (1.956 m), weight 228 lb 12.8 oz (103.8 kg), SpO2 100%.Body mass index is 27.13 kg/m.  General Appearance: Casual  Eye Contact:  Good  Speech:  Clear and Coherent and Normal Rate  Volume:  Normal  Mood:  Euthymic  Affect:  Appropriate  Thought Process:  Coherent, Goal Directed, and Descriptions of Associations: Intact  Orientation:  Full (Time, Place, and Person)  Thought Content: WDL   Suicidal Thoughts:  No  Homicidal Thoughts:  No  Memory:  Immediate;   Good Recent;   Good Remote;   Good  Judgement:  Good  Insight:  Good  Psychomotor Activity:  Normal  Concentration:  Concentration: Good and Attention Span: Good  Recall:  Good  Fund of Knowledge: Good  Language: Good  Akathisia:  No  Handed:  Left  AIMS (if indicated): not done  Assets:  Communication Skills Desire for Improvement Housing Social Support Vocational/Educational  ADL's:  Intact  Cognition: WNL  Sleep:  Good   Screenings: AIMS    Flowsheet Row [facility]  AIMS Total Score 0   AUDIT    Flowsheet Row [facility]  Alcohol Use Disorder Identification Test Final Score (AUDIT) 6 2   CAGE-AID    Flowsheet Row ED to Hosp-Admission (Discharged) from 07/20/2020 in [facility]  SURGICAL  CAGE-AID Score 2   GAD-7    Flowsheet Row Clinical Support from 06/20/2024 in [facility]  Clinical Support from 05/09/2024 in [facility] Clinical Support from 03/28/2024 in [facility] Clinical Support from 02/22/2024 in [facility] Office Visit from 02/15/2024 in [facility]  Total GAD-7 Score 1 7 6  0 5   PHQ2-9    Flowsheet Row Clinical Support from 06/20/2024 in [facility] Clinical Support from 05/09/2024 in [facility] Clinical Support from 03/28/2024 in [facility] Clinical Support from 02/22/2024 in [facility] Office Visit from [facility]/15/20[facility]  PHQ-2 Total Score 1 1 0 2 1  PHQ-9 Total Score -- -- -- 8 --   Flowsheet Row Clinical Support from 06/20/2024 in [facility] Clinical Support from 05/09/2024 in [facility] Clinical Support from 03/28/2024 in [facility]  C-SSRS RISK CATEGORY No Risk No Risk No Risk     Assessment and Plan:   Reinhart Saulters is a 38 year old male with a past psychiatric history significant for bipolar 1 disorder, generalized anxiety disorder, and 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  Patient presents to the encounter stating that he is doing well and denies any issues or concerns at this time.  Patient denies overt depressive symptoms nor does he endorse anxiety.  He continues to endorse issues with his focus and concentration stating that he still struggles with inattentiveness and impulsivity.  Patient also endorses episodes of brain fog.  Patient was able to have a urine drug screen performed with the patient results being significant for no illicit substances.  Patient  has a past history of being placed on methylphenidate  for the management of his ADHD.  Patient will be placed on methylphenidate  5 mg 2 times daily for the management of his symptoms of ADHD.  Patient is agreeable to recommendation.  Patient's medication to be e-prescribed to pharmacy of choice.  Provider discussed side effects associated with the use of methylphenidate  with the patient prior to the conclusion of the encounter.  Patient verbalized understanding.   A [place] Suicide Severity Rating Scale was performed with the patient being considered no risk.  Patient denies suicidal ideations and is able to contract for safety following the conclusion of the encounter.  Collaboration of Care: Collaboration of Care: Psychiatrist AEB patient being followed by a mental health provider at this facility and Referral or follow-up with counselor/therapist AEB patient being seen by licensed clinical social worker at this facility  Patient/Guardian was advised Release of Information must be obtained prior to any record release in order to collaborate their care with an outside provider. Patient/Guardian was advised if they have not already done so to contact the registration department to sign all necessary forms in order for us  to release information regarding their care.   Consent: Patient/Guardian gives verbal consent for treatment and assignment of benefits for services provided during this visit. Patient/Guardian expressed understanding and agreed to proceed.   1. Attention deficit hyperactivity disorder (ADHD), predominantly inattentive type (Primary)  - methylphenidate  (RITALIN ) 5 MG tablet; Take 1 tablet (5 mg total) by mouth 2 (two) times daily with breakfast and lunch.  Dispense: 60 tablet; Refill: 0  2. Bipolar 1 disorder, mixed, partial remission (HCC)  3. GAD (generalized anxiety disorder)  Patient to follow up in 6 weeks Provider spent a total of 16 minutes with the patient/reviewing  patient's chart  Reginia FORBES Bolster, PA 06/20/2024, 3:00 PM

## 2024-06-23 ENCOUNTER — Encounter (HOSPITAL_COMMUNITY): Payer: Self-pay | Admitting: Physician Assistant

## 2024-06-27 ENCOUNTER — Ambulatory Visit (INDEPENDENT_AMBULATORY_CARE_PROVIDER_SITE_OTHER): Payer: MEDICAID | Admitting: Clinical

## 2024-06-27 DIAGNOSIS — F411 Generalized anxiety disorder: Secondary | ICD-10-CM

## 2024-06-27 NOTE — Progress Notes (Unsigned)
 THERAPIST PROGRESS NOTE Virtual Visit via Video Note  I connected with John [name] on 06/27/24 at  1:00 PM EDT by a video enabled telemedicine application and verified that I am speaking with the correct person using two identifiers.  Location: Patient: home Provider: office   I discussed the limitations of evaluation and management by telemedicine and the availability of in person appointments. The patient expressed understanding and agreed to proceed.   Follow Up Instructions: I discussed the assessment and treatment plan with the patient. The patient was provided an opportunity to ask questions and all were answered. The patient agreed with the plan and demonstrated an understanding of the instructions.   The patient was advised to call back or seek an in-person evaluation if the symptoms worsen or if the condition fails to improve as anticipated.  Session Time: 60 min  Participation Level: {BHH PARTICIPATION LEVEL:22264}  Behavioral Response: {Appearance:22683}{BHH LEVEL OF CONSCIOUSNESS:22305}{BHH MOOD:22306}  Type of Therapy: {CHL AMB BH Type of Therapy:21022741}  Treatment Goals addressed: ***  ProgressTowards Goals: {Progress Towards Goals:21014066}  Interventions: {CHL AMB BH Type of Intervention:21022753}  Summary:  John [name] is a 38 y.o. male who presents for scheduled appointment oriented x 5, appropriately dressed, and friendly.  Client denied hallucinations and delusions. Client reported Wednesday he has been doing fairly okay.  Client reported he has been continuing to work with his doctors on treatment plan for his left shoulder.  Client reported they are giving some new ideas as to why he cannot mobilize and utilizes full-strength and are coming up with some things to do.  Client reported he is nervous about his aftercare of surgery because he cannot rely on his family or want him to be in his space.  Client reported one of the dynamics has been  challenging for him over times interacting with his family.  Client reported that communication tactics tends to be more loud and sometimes controlling the hospital.  Client reported when he tries to respectfully dulled out their noise by utilizing earphones or navigating the conversation to point out issues within their dynamic they become combative.  Client reported that is also been likewise in his feelings and other interpersonal relationships.  Client reported he does sometimes have a hard time connecting with them due to differences in how each 1 communicates.  Client reported leaves him to feel like he has to always make the accommodations while others do not. Evidence of progress towards goal: Client reported medication compliance 7 days a week.  Suicidal/Homicidal: Nowithout intent/plan  Therapist Response:  Therapist began the appointment asking the client how he has been doing since last seen. Therapist engaged with active listening and positive emotional support. Therapist used CBT to give the client time to discuss his thoughts and feelings about interpersonal relationships and his ongoing difficulty with communication. Therapist used CBT to teach client about communication skills and not worrying about things outside of his control. Therapist used CBT ask the client to identify his progress with frequency of use with coping skills with continued practice in his daily activity.  Therapist assigned client to practice self-care.   Plan: Return again in *** weeks.  Diagnosis: No diagnosis found.  Collaboration of Care: {BH OP Collaboration of Care:21014065}  Patient/Guardian was advised Release of Information must be obtained prior to any record release in order to collaborate their care with an outside provider. Patient/Guardian was advised if they have not already done so to contact the registration department to  sign all necessary forms in order for us  to release information  regarding their care.   Consent: Patient/Guardian gives verbal consent for treatment and assignment of benefits for services provided during this visit. Patient/Guardian expressed understanding and agreed to proceed.   John [name] Y Delmi Fulfer, LCSW 06/27/2024

## 2024-07-25 ENCOUNTER — Ambulatory Visit (INDEPENDENT_AMBULATORY_CARE_PROVIDER_SITE_OTHER): Payer: MEDICAID | Admitting: Clinical

## 2024-07-25 DIAGNOSIS — F900 Attention-deficit hyperactivity disorder, predominantly inattentive type: Secondary | ICD-10-CM

## 2024-07-27 ENCOUNTER — Encounter (HOSPITAL_BASED_OUTPATIENT_CLINIC_OR_DEPARTMENT_OTHER): Payer: Self-pay | Admitting: Orthopedic Surgery

## 2024-07-27 ENCOUNTER — Other Ambulatory Visit: Payer: Self-pay

## 2024-07-31 ENCOUNTER — Encounter (HOSPITAL_BASED_OUTPATIENT_CLINIC_OR_DEPARTMENT_OTHER)
Admission: RE | Admit: 2024-07-31 | Discharge: 2024-07-31 | Disposition: A | Discharge status: Home / Self Care | Payer: MEDICAID | Source: Ambulatory Visit | Attending: Orthopedic Surgery | Admitting: Orthopedic Surgery

## 2024-07-31 DIAGNOSIS — I10 Essential (primary) hypertension: Secondary | ICD-10-CM | POA: Diagnosis not present

## 2024-07-31 DIAGNOSIS — G5622 Lesion of ulnar nerve, left upper limb: Secondary | ICD-10-CM | POA: Diagnosis present

## 2024-07-31 DIAGNOSIS — Z7951 Long term (current) use of inhaled steroids: Secondary | ICD-10-CM | POA: Diagnosis not present

## 2024-07-31 DIAGNOSIS — F909 Attention-deficit hyperactivity disorder, unspecified type: Secondary | ICD-10-CM | POA: Diagnosis not present

## 2024-07-31 DIAGNOSIS — F419 Anxiety disorder, unspecified: Secondary | ICD-10-CM | POA: Diagnosis not present

## 2024-07-31 DIAGNOSIS — Z87891 Personal history of nicotine dependence: Secondary | ICD-10-CM | POA: Diagnosis not present

## 2024-07-31 DIAGNOSIS — F319 Bipolar disorder, unspecified: Secondary | ICD-10-CM | POA: Diagnosis not present

## 2024-07-31 DIAGNOSIS — Z79899 Other long term (current) drug therapy: Secondary | ICD-10-CM | POA: Diagnosis not present

## 2024-07-31 NOTE — H&P (Signed)
 HAND SURGERY   HPI: Patient is a 38 y.o. 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  Patient denies any changes to their medical history or new systemic symptoms today.    Past Medical History:  Diagnosis Date   ADHD (attention deficit hyperactivity disorder)    Bipolar 1 disorder (HCC)    Hypertension    Seasonal allergies    Thyroid disease 2017   Unknown what exactly--[place] in 2017   Past Surgical History:  Procedure Laterality Date   WISDOM TOOTH EXTRACTION     Social History   Socioeconomic History   Marital status: Single    Spouse name: Not on file   Number of children: 0   Years of education: 15   Highest education level: Some college, no degree  Occupational History   Occupation: Biomedical scientist at [employer]  Tobacco Use   Smoking status: Former    Current packs/day: 0.00    Average packs/day: 0.3 packs/day for 8.0 years (2.0 ttl pk-yrs)    Types: Cigarettes    Start date: 05/02/2011    Quit date: 05/02/2019    Years since quitting: 5.2    Passive exposure: Past   Smokeless tobacco: Never  Vaping Use   Vaping status: Former  Substance and Sexual Activity   Alcohol use: Yes    Comment: 1-2 drinks weekly   Drug use: Not Currently    Types: Marijuana    Comment: None since 2022.   Sexual activity: Not Currently  Other Topics Concern   Not on file  Social History Narrative   Lives alone   Working with [facility] for some financial support.   No significant other at this time.   Social Drivers of Corporate investment banker Strain: Low Risk  (10/06/2023)   Overall Financial Resource Strain (CARDIA)    Difficulty of Paying Living Expenses: Not very hard  Food Insecurity: Food Insecurity Present (10/06/2023)   Hunger Vital Sign    Worried About Running Out of Food in the Last Year: Sometimes true    Ran Out of Food in the Last Year: Sometimes true  Transportation Needs: No Transportation Needs  (10/06/2023)   PRAPARE - Administrator, Civil Service (Medical): No    Lack of Transportation (Non-Medical): No  Physical Activity: Sufficiently Active (10/02/2019)   Exercise Vital Sign    Days of Exercise per Week: 7 days    Minutes of Exercise per Session: 120 min  Stress: No Stress Concern Present (10/02/2019)   [employer] of Occupational Health - Occupational Stress Questionnaire    Feeling of Stress : Not at all  Social Connections: Not on file   Family History  Problem Relation Age of Onset   Hypertension Mother    Schizophrenia Mother    Schizophrenia Sister    - negative except otherwise stated in the family history section No Known Allergies Prior to Admission medications   Medication Sig Start Date End Date Taking? Authorizing Provider  amLODipine  (NORVASC ) 5 MG tablet Take 1 tablet (5 mg total) by mouth daily. 10/06/23  Yes Adella Norris, MD  B Complex Vitamins (VITAMIN B COMPLEX) CAPS Take 250 capsules by mouth daily.   Yes [provider]  loratadine  (CLARITIN ) 10 MG tablet Take 10 mg by mouth as needed for allergies.   Yes [provider]  Magnesium  300 MG CAPS Take 1,200 mg by mouth daily. 4 caps by mouth daily   Yes  [provider]  methylphenidate  (RITALIN ) 5 MG tablet Take 1 tablet (5 mg total) by mouth 2 (two) times daily with breakfast and lunch. 06/20/24  Yes Nwoko, Uchenna E, PA  Multiple Vitamin (MULTI-VITAMIN DAILY PO) Take by mouth.   Yes [provider]  Omega-3 Fatty Acids (FISH OIL) 1000 MG CAPS Take by mouth.   Yes [provider]  THEANINE PO Take 1,000 mg by mouth. Every other day   Yes [provider]  atomoxetine  (STRATTERA ) 40 MG capsule Take 1 capsule (40 mg total) by mouth daily. Patient not taking: Reported on 07/27/2024 02/15/24   Nwoko, Uchenna E, PA  mometasone  (NASONEX ) 50 MCG/ACT nasal spray 2 sprays each nostril daily during your allergy seasons. 10/02/19   Adella Norris, MD  triamcinolone  cream (KENALOG ) 0.1 % Apply twice daily to affected areas as needed. 10/06/23   Adella Norris, MD  Tyrosine 500 MG TABS Take 1 tablet by mouth every other day. Every other day    [provider]   No results found. - Positive ROS: All other systems have been reviewed and were otherwise negative with the exception of those mentioned in the HPI and as above.  Physical Exam: General: No acute distress, resting comfortably Cardiovascular: BUE warm and well perfused, normal rate Respiratory: Normal WOB on RA Skin: Warm and dry Neurologic: Sensation intact distally Psychiatric: Patient is at baseline mood and affect  Left Upper Extremity  He is tender palpation along the medial aspect of the elbow with a strongly positive Tinel's sign along the ulnar nerve. He does have evidence of ulnar nerve instability with active range of motion of the elbow with subluxation of the ulnar nerve onto the medial epicondyle. He has a negative Tinel's sign over the wrist. He has full active range of motion of the elbow, forearm, wrist, and fingers. He has notable weakness with FDI testing with 4/5 strength compared to 5/5 at the contralateral side. Sensation is diminished to light touch in the ulnar distribution but intact in the median and radial nerve distributions. All fingers are warm and well-perfused with brisk cap refill.   Assessment: 38 yo M w/ left cubital tunnel syndrome that has failed conservative management.   Plan: OR today for left cubital tunnel release with anterior transposition.  We again reviewed the risks of surgery which include bleeding, infection, damage to the ulnar nerve or it's branches, persistent symptoms including pain or numbness, delayed wound healing, need for additional surgery.    All questions were answered.   Bebe Galla, M.D. [facility] 8:15 PM

## 2024-07-31 NOTE — Progress Notes (Signed)

## 2024-08-01 ENCOUNTER — Encounter (HOSPITAL_BASED_OUTPATIENT_CLINIC_OR_DEPARTMENT_OTHER): Payer: Self-pay | Admitting: Anesthesiology

## 2024-08-01 ENCOUNTER — Encounter (HOSPITAL_BASED_OUTPATIENT_CLINIC_OR_DEPARTMENT_OTHER): Payer: Self-pay | Admitting: Orthopedic Surgery

## 2024-08-01 ENCOUNTER — Ambulatory Visit (HOSPITAL_BASED_OUTPATIENT_CLINIC_OR_DEPARTMENT_OTHER): Payer: Self-pay | Admitting: Anesthesiology

## 2024-08-01 ENCOUNTER — Other Ambulatory Visit: Payer: Self-pay

## 2024-08-01 ENCOUNTER — Encounter (HOSPITAL_BASED_OUTPATIENT_CLINIC_OR_DEPARTMENT_OTHER)
Admission: RE | Disposition: A | Discharge status: Home / Self Care | Payer: Self-pay | Source: Home / Self Care | Attending: Orthopedic Surgery

## 2024-08-01 ENCOUNTER — Encounter (HOSPITAL_COMMUNITY): Payer: MEDICAID | Admitting: Physician Assistant

## 2024-08-01 ENCOUNTER — Ambulatory Visit (HOSPITAL_BASED_OUTPATIENT_CLINIC_OR_DEPARTMENT_OTHER)
Admission: RE | Admit: 2024-08-01 | Discharge: 2024-08-01 | Disposition: A | Discharge status: Home / Self Care | Payer: Self-pay | Attending: Orthopedic Surgery | Admitting: Orthopedic Surgery

## 2024-08-01 DIAGNOSIS — Z79899 Other long term (current) drug therapy: Secondary | ICD-10-CM | POA: Insufficient documentation

## 2024-08-01 DIAGNOSIS — I10 Essential (primary) hypertension: Secondary | ICD-10-CM | POA: Diagnosis not present

## 2024-08-01 DIAGNOSIS — Z7951 Long term (current) use of inhaled steroids: Secondary | ICD-10-CM | POA: Insufficient documentation

## 2024-08-01 DIAGNOSIS — Z87891 Personal history of nicotine dependence: Secondary | ICD-10-CM | POA: Diagnosis not present

## 2024-08-01 DIAGNOSIS — F419 Anxiety disorder, unspecified: Secondary | ICD-10-CM | POA: Insufficient documentation

## 2024-08-01 DIAGNOSIS — G5622 Lesion of ulnar nerve, left upper limb: Secondary | ICD-10-CM

## 2024-08-01 DIAGNOSIS — F319 Bipolar disorder, unspecified: Secondary | ICD-10-CM | POA: Diagnosis not present

## 2024-08-01 DIAGNOSIS — F909 Attention-deficit hyperactivity disorder, unspecified type: Secondary | ICD-10-CM | POA: Insufficient documentation

## 2024-08-01 HISTORY — PX: ULNAR TUNNEL RELEASE: SHX820

## 2024-08-01 SURGERY — RELEASE, CUBITAL TUNNEL
Anesthesia: Monitor Anesthesia Care | Site: Hand | Laterality: Left

## 2024-08-01 MED ORDER — OXYCODONE HCL 5 MG/5ML PO SOLN: 5.0000 mg | Freq: Once | ORAL | Status: DC | PRN

## 2024-08-01 MED ORDER — PROPOFOL 500 MG/50ML IV EMUL
INTRAVENOUS | Status: AC
  Filled 2024-08-01: qty 150

## 2024-08-01 MED ORDER — OXYCODONE HCL 5 MG PO TABS: 5.0000 mg | ORAL_TABLET | Freq: Once | ORAL | Status: DC | PRN

## 2024-08-01 MED ORDER — MIDAZOLAM HCL 2 MG/2ML IJ SOLN
1.0000 mg | Freq: Once | INTRAMUSCULAR | Status: AC
  Administered 2024-08-01: 2 mg via INTRAVENOUS

## 2024-08-01 MED ORDER — LACTATED RINGERS IV SOLN: INTRAVENOUS | Status: DC

## 2024-08-01 MED ORDER — ALBUMIN HUMAN 5 % IV SOLN
INTRAVENOUS | Status: AC
  Filled 2024-08-01: qty 250

## 2024-08-01 MED ORDER — LIDOCAINE HCL (CARDIAC) PF 100 MG/5ML IV SOSY
PREFILLED_SYRINGE | INTRAVENOUS | Status: DC | PRN
  Administered 2024-08-01: 20 mg via INTRAVENOUS

## 2024-08-01 MED ORDER — ROPIVACAINE HCL 5 MG/ML IJ SOLN
INTRAMUSCULAR | Status: DC | PRN
  Administered 2024-08-01: 30 mL via PERINEURAL

## 2024-08-01 MED ORDER — DEXMEDETOMIDINE HCL IN NACL 80 MCG/20ML IV SOLN
INTRAVENOUS | Status: DC | PRN
  Administered 2024-08-01: 4 ug via INTRAVENOUS

## 2024-08-01 MED ORDER — PHENYLEPHRINE HCL-NACL 20-0.9 MG/250ML-% IV SOLN
INTRAVENOUS | Status: AC
  Filled 2024-08-01: qty 250

## 2024-08-01 MED ORDER — CEFAZOLIN SODIUM-DEXTROSE 2-4 GM/100ML-% IV SOLN
INTRAVENOUS | Status: AC
  Filled 2024-08-01: qty 100

## 2024-08-01 MED ORDER — 0.9 % SODIUM CHLORIDE (POUR BTL) OPTIME
TOPICAL | Status: DC | PRN
  Administered 2024-08-01: 1000 mL

## 2024-08-01 MED ORDER — CEFAZOLIN SODIUM-DEXTROSE 2-4 GM/100ML-% IV SOLN
2.0000 g | INTRAVENOUS | Status: AC
  Administered 2024-08-01: 2 g via INTRAVENOUS

## 2024-08-01 MED ORDER — SODIUM CHLORIDE 0.9 % IV SOLN: 12.5000 mg | INTRAVENOUS | Status: DC | PRN

## 2024-08-01 MED ORDER — HYDROMORPHONE HCL 1 MG/ML IJ SOLN: 0.2500 mg | INTRAMUSCULAR | Status: DC | PRN

## 2024-08-01 MED ORDER — OXYCODONE HCL 5 MG PO TABS
5.0000 mg | ORAL_TABLET | Freq: Four times a day (QID) | ORAL | 0 refills | Status: AC | PRN
Start: 2024-08-01 — End: 2024-08-08

## 2024-08-01 MED ORDER — ONDANSETRON HCL 4 MG/2ML IJ SOLN
INTRAMUSCULAR | Status: DC | PRN
  Administered 2024-08-01: 4 mg via INTRAVENOUS

## 2024-08-01 MED ORDER — PROPOFOL 500 MG/50ML IV EMUL
INTRAVENOUS | Status: DC | PRN
  Administered 2024-08-01: 100 ug/kg/min via INTRAVENOUS

## 2024-08-01 MED ORDER — FENTANYL CITRATE (PF) 100 MCG/2ML IJ SOLN
50.0000 ug | Freq: Once | INTRAMUSCULAR | Status: AC
  Administered 2024-08-01: 100 ug via INTRAVENOUS

## 2024-08-01 MED ORDER — FENTANYL CITRATE (PF) 100 MCG/2ML IJ SOLN
INTRAMUSCULAR | Status: AC
  Filled 2024-08-01: qty 2

## 2024-08-01 MED ORDER — MIDAZOLAM HCL 2 MG/2ML IJ SOLN
INTRAMUSCULAR | Status: AC
Start: 2024-08-01 — End: 2024-08-01
  Filled 2024-08-01: qty 2

## 2024-08-01 MED ORDER — DEXMEDETOMIDINE HCL IN NACL 80 MCG/20ML IV SOLN
INTRAVENOUS | Status: AC
  Filled 2024-08-01: qty 20

## 2024-08-01 SURGICAL SUPPLY — 46 items
BLADE SURG 15 STRL LF DISP TIS (BLADE) ×1 IMPLANT
BNDG COMPR ESMARK 4X3 LF (GAUZE/BANDAGES/DRESSINGS) ×1 IMPLANT
BNDG ELASTIC 3INX 5YD STR LF (GAUZE/BANDAGES/DRESSINGS) ×1 IMPLANT
BNDG ELASTIC 4INX 5YD STR LF (GAUZE/BANDAGES/DRESSINGS) IMPLANT
BNDG ELASTIC 6INX 5YD STR LF (GAUZE/BANDAGES/DRESSINGS) IMPLANT
BNDG GAUZE DERMACEA FLUFF 4 (GAUZE/BANDAGES/DRESSINGS) ×1 IMPLANT
BNDG PLASTER X FAST 3X3 WHT LF (CAST SUPPLIES) IMPLANT
CHLORAPREP W/TINT 26 (MISCELLANEOUS) ×1 IMPLANT
CORD BIPOLAR FORCEPS 12FT (ELECTRODE) ×1 IMPLANT
COVER BACK TABLE 60X90IN (DRAPES) ×1 IMPLANT
CUFF TOURN SGL QUICK 18X4 (TOURNIQUET CUFF) IMPLANT
CUFF TRNQT CYL 24X4X16.5-23 (TOURNIQUET CUFF) IMPLANT
DERMABOND ADVANCED .7 DNX12 (GAUZE/BANDAGES/DRESSINGS) IMPLANT
DRAPE EXTREMITY T 121X128X90 (DISPOSABLE) ×1 IMPLANT
DRAPE IMP U-DRAPE 54X76 (DRAPES) ×1 IMPLANT
DRAPE SURG 17X23 STRL (DRAPES) ×1 IMPLANT
GAUZE SPONGE 4X4 12PLY STRL (GAUZE/BANDAGES/DRESSINGS) ×1 IMPLANT
GAUZE XEROFORM 1X8 LF (GAUZE/BANDAGES/DRESSINGS) ×1 IMPLANT
GLOVE BIO SURGEON STRL SZ7 (GLOVE) ×1 IMPLANT
GLOVE BIOGEL PI IND STRL 7.0 (GLOVE) ×1 IMPLANT
GOWN STRL REUS W/ TWL LRG LVL3 (GOWN DISPOSABLE) ×2 IMPLANT
NDL HYPO 25X1 1.5 SAFETY (NEEDLE) IMPLANT
NEEDLE HYPO 25X1 1.5 SAFETY (NEEDLE) IMPLANT
NS IRRIG 1000ML POUR BTL (IV SOLUTION) ×1 IMPLANT
PACK BASIN DAY SURGERY FS (CUSTOM PROCEDURE TRAY) ×1 IMPLANT
PAD CAST 3X4 CTTN HI CHSV (CAST SUPPLIES) ×1 IMPLANT
PADDING CAST ABS COTTON 3X4 (CAST SUPPLIES) IMPLANT
PADDING CAST SYNTHETIC 3X4 NS (CAST SUPPLIES) IMPLANT
SHEET MEDIUM DRAPE 40X70 STRL (DRAPES) ×1 IMPLANT
SLEEVE SCD COMPRESS KNEE MED (STOCKING) IMPLANT
SLING ARM FOAM STRAP LRG (SOFTGOODS) IMPLANT
SPLINT FIBERGLASS 4X30 (CAST SUPPLIES) IMPLANT
STRIP CLOSURE SKIN 1/2X4 (GAUZE/BANDAGES/DRESSINGS) IMPLANT
SUCTION TUBE FRAZIER 10FR DISP (SUCTIONS) IMPLANT
SUT ETHIBOND 3-0 V-5 (SUTURE) IMPLANT
SUT ETHILON 3 0 PS 1 (SUTURE) ×1 IMPLANT
SUT ETHILON 4 0 PS 2 18 (SUTURE) ×1 IMPLANT
SUT MNCRL AB 3-0 PS2 18 (SUTURE) IMPLANT
SUT MNCRL AB 4-0 PS2 18 (SUTURE) IMPLANT
SUT VIC AB 4-0 PS2 18 (SUTURE) IMPLANT
SUT VICRYL 0 SH 27 (SUTURE) IMPLANT
SYR BULB EAR ULCER 3OZ GRN STR (SYRINGE) ×1 IMPLANT
SYR CONTROL 10ML LL (SYRINGE) IMPLANT
TOWEL GREEN STERILE FF (TOWEL DISPOSABLE) ×2 IMPLANT
TUBE CONNECTING 20X1/4 (TUBING) IMPLANT
UNDERPAD 30X36 HEAVY ABSORB (UNDERPADS AND DIAPERS) ×1 IMPLANT

## 2024-08-01 NOTE — Anesthesia Preprocedure Evaluation (Signed)
 Anesthesia Evaluation  Patient identified by MRN, date of birth, ID band Patient awake    Reviewed: Allergy & Precautions, H&P , NPO status , Patient's Chart, lab work & pertinent test results  Airway Mallampati: II  TM Distance: >3 FB Neck ROM: Full    Dental  (+) Dental Advisory Given   Pulmonary neg pulmonary ROS, former smoker   Pulmonary exam normal breath sounds clear to auscultation       Cardiovascular hypertension, Pt. on medications negative cardio ROS Normal cardiovascular exam Rhythm:Regular Rate:Normal     Neuro/Psych  Headaches  Anxiety  Bipolar Disorder    negative psych ROS   GI/Hepatic negative GI ROS, Neg liver ROS,,,  Endo/Other  negative endocrine ROS    Renal/GU negative Renal ROS  negative genitourinary   Musculoskeletal negative musculoskeletal ROS (+)    Abdominal   Peds negative pediatric ROS (+)  Hematology negative hematology ROS (+)   Anesthesia Other Findings   Reproductive/Obstetrics negative OB ROS                              Anesthesia Physical Anesthesia Plan  ASA: 2  Anesthesia Plan: MAC   Post-op Pain Management: Regional block* and Minimal or no pain anticipated   Induction: Intravenous  PONV Risk Score and Plan: 1 and Propofol  infusion and Treatment may vary due to age or medical condition  Airway Management Planned: Simple Face Mask  Additional Equipment:   Intra-op Plan:   Post-operative Plan:   Informed Consent: I have reviewed the patients History and Physical, chart, labs and discussed the procedure including the risks, benefits and alternatives for the proposed anesthesia with the patient or authorized representative who has indicated his/her understanding and acceptance.     Dental advisory given  Plan Discussed with: CRNA  Anesthesia Plan Comments:         Anesthesia Quick Evaluation

## 2024-08-01 NOTE — Interval H&P Note (Signed)
 History and Physical Interval Note:  08/01/2024 8:25 AM  John [name]  has presented today for surgery, with the diagnosis of Left cubital tunnel syndrome.  The various methods of treatment have been discussed with the patient and family. After consideration of risks, benefits and other options for treatment, the patient has consented to  Procedure(s) with comments: RELEASE, CUBITAL TUNNEL (Left) - Left cubital tunnel release with anterior transposition Regional + MAC as a surgical intervention.  The patient's history has been reviewed, patient examined, no change in status, stable for surgery.  I have reviewed the patient's chart and labs.  Questions were answered to the patient's satisfaction.     Rishaan Gunner

## 2024-08-01 NOTE — Anesthesia Postprocedure Evaluation (Signed)
 Anesthesia Post Note  Patient: John [name]  Procedure(s) Performed: RELEASE, CUBITAL TUNNEL WITH ANTERIOR TRANSPOSITION (Left: Hand)     Patient location during evaluation: PACU Anesthesia Type: MAC Level of consciousness: awake and alert Pain management: pain level controlled Vital Signs Assessment: post-procedure vital signs reviewed and stable Respiratory status: spontaneous breathing, nonlabored ventilation and respiratory function stable Cardiovascular status: blood pressure returned to baseline and stable Postop Assessment: no apparent nausea or vomiting Anesthetic complications: no   No notable events documented.  Last Vitals:  Vitals:   08/01/24 1015 08/01/24 1039  BP: (!) 142/81 133/86  Pulse:  66  Resp: 15 16  Temp:  (!) 36.4 C  SpO2: 98% 100%    Last Pain:  Vitals:   08/01/24 1039  TempSrc: Temporal  PainSc: 0-No pain                 Butler Levander Pinal

## 2024-08-01 NOTE — Transfer of Care (Signed)
 Immediate Anesthesia Transfer of Care Note  Patient: John [name]  Procedure(s) Performed: RELEASE, CUBITAL TUNNEL WITH ANTERIOR TRANSPOSITION (Left: Hand)  Patient Location: PACU  Anesthesia Type:MAC and Regional  Level of Consciousness: awake, alert , and patient cooperative  Airway & Oxygen Therapy: Patient Spontanous Breathing  Post-op Assessment: Report given to RN and Post -op Vital signs reviewed and stable  Post vital signs: Reviewed and stable  Last Vitals:  Vitals Value Taken Time  BP    Temp    Pulse    Resp    SpO2      Last Pain:  Vitals:   08/01/24 0807  PainSc: 3       Patients Stated Pain Goal: 4 (08/01/24 9356)  Complications: No notable events documented.

## 2024-08-01 NOTE — Anesthesia Procedure Notes (Signed)
 Anesthesia Regional Block: Supraclavicular block   Pre-Anesthetic Checklist: , timeout performed,  Correct Patient, Correct Site, Correct Laterality,  Correct Procedure, Correct Position, site marked,  Risks and benefits discussed,  Surgical consent,  Pre-op evaluation,  At surgeon's request and post-op pain management  Laterality: Left  Prep: chloraprep       Needles:  Injection technique: Single-shot  Needle Type: Stimiplex     Needle Length: 9cm  Needle Gauge: 21     Additional Needles:   Procedures:,,,, ultrasound used (permanent image in chart),,    Narrative:  Start time: 08/01/2024 8:11 AM End time: 08/01/2024 8:16 AM Injection made incrementally with aspirations every 5 mL.  Performed by: Personally  Anesthesiologist: Cleotilde Butler Dade, MD

## 2024-08-01 NOTE — Progress Notes (Signed)
 Assisted Dr. Hyacinth Meeker with left, supraclavicular, ultrasound guided block. Side rails up, monitors on throughout procedure. See vital signs in flow sheet. Tolerated Procedure well.

## 2024-08-01 NOTE — Op Note (Signed)
 Date of Surgery: 08/01/2024  INDICATIONS: Patient is a 38 y.o.-year-old male with left cubital tunnel syndrome that has progressively worsened and has failed conservative management.  Risks, benefits, and alternatives to surgery were again discussed with the patient in the preoperative area. The patient wishes to proceed with surgery.  Informed consent was signed after our discussion.   PREOPERATIVE DIAGNOSIS:  Left cubital tunnel syndrome  POSTOPERATIVE DIAGNOSIS: Same.  PROCEDURE:  Left cubital tunnel release with anterior transposition Z-lengthening of flexor-pronator tendons at forearm   SURGEON: Carlin Galla, M.D.  ASSIST: None  ANESTHESIA:  Regional, MAC  IV FLUIDS AND URINE: See anesthesia.  ESTIMATED BLOOD LOSS: 15 mL.  IMPLANTS: * No implants in log *   DRAINS: None  COMPLICATIONS: None noted  DESCRIPTION OF PROCEDURE: The patient was met in the preoperative holding area where the surgical site was marked and the consent form was signed.  The patient was then taken to the operating room and remained on the stretcher.  A hand table was placed adjacent to the left upper extremity and locked into place.  All bony prominences were well padded.  A tourniquet was applied high on the left upper arm.  Monitored anesthesia was induced.  Preoperative antibiotics were given.  The operative extremity was prepped and draped in the usual and sterile fashion.  A formal time-out was performed to confirm that this was the correct patient, surgery, side, and site.   Following formal timeout, the limb was gently exsanguinated with an Esmarch bandage and the tourniquet inflated to 250 mmHg.  A longitudinal incision was made at the posterior aspect of the medial epicondyle extending both proximally and distally in line with the ulnar nerve.  The skin was incised.  Blunt dissection was used through the adipose tissue.  Small crossing vessels were coagulated with bipolar cautery.  The normal  crossing point of the medial antebrachial cutaneous nerve was marked approximately 3 and half centimeters distal to the medial condyle.  Further blunt dissection was used to identify the ulnar nerve just posterior and proximal to the medial epicondyle.  The ulnar nerve was decompressed distally.  Osborne's ligament was released.  The fascia overlying the flexor carpi ulnaris was sharply divided.  Care was taken in the area of the medial antebrachial cutaneous nerve.  The humeral and ulnar heads of the FCU muscle belly were identified.  Blunt dissection was used to divide the muscle bellies to further decompress the ulnar nerve.  The nerve was then decompressed proximally.  The fascia overlying the ulnar nerve was divided with care taken to protect the adjacent venous plexus.  The nerve was decompressed proximally to the level of the arcade of Struthers.  The medial intermuscular septum was excised taking care to protect the adjacent venous plexus.  The nerve was mobilized circumferentially to allow for a loose anterior transposition without constriction or tension.  At this point, flaps were designed over the fascia of the flexor pronator mass.  A Z-plasty of the flexor pronator tendon/fascia was performed taking care that the flaps were as long and wide as possible to avoid secondary compression with the sling.  The flaps were carefully elevated.  The ulnar nerve was then transposed anteriorly.  The 2 fascial flaps were then sutured together using a 3-0 Ethibond.  The fascial sling was very loose so as to not cause secondary compression of the nerve.  Passive flexion and extension of the elbow was performed and the nerve was found to have  a straight path in full extension without compression, kinking, or tension.  The nerve was quite loose in flexion without any secondary constriction.  Following transposition, the wound was thoroughly irrigated with copious sterile saline.  The tourniquet was deflated.  Hemostasis  was achieved with both direct pressure over the wound and bipolar electrocautery.  The wound was then closed in layered fashion using a 3-0 Monocryl in a buried interrupted fashion followed by 4-0 nylon suture in horizontal mattress fashion.  The extremity was dressed with xeroform, bulky folded kerlix, cast padding, and a well padded long arm splint.   The patient was reversed from sedation.  All counts were correct x 2 at the end of the procedure.  The patient was then taken to the PACU in stable condition.   POSTOPERATIVE PLAN: He will be discharged to home with appropriate pain medication and discharge instructions.  A referral has been placed to hand therapy.  I'll see him back in the office in 10-14 days for his first postop visit.   Carlin Galla, MD 9:59 AM

## 2024-08-01 NOTE — Discharge Instructions (Addendum)
 John [name], M.D. Hand Surgery  POST-OPERATIVE DISCHARGE INSTRUCTIONS   PRESCRIPTIONS: You may have been given a prescription to be taken as directed for post-operative pain control.  You may also take over the counter ibuprofen /aleve and tylenol  for pain. Take this as directed on the packaging. Do not exceed 3000 mg tylenol /acetaminophen  in 24 hours.  Ibuprofen  600-800 mg (3-4) tablets by mouth every 6 hours as needed for pain.  OR Aleve 2 tablets by mouth every 12 hours (twice daily) as needed for pain.  AND/OR Tylenol  1000 mg (2 tablets) every 8 hours as needed for pain.  Please use your pain medication carefully, as refills are limited and you may not be provided with one.  As stated above, please use over the counter pain medicine - it will also be helpful with decreasing your swelling.    ANESTHESIA: After your surgery, post-surgical discomfort or pain is likely. This discomfort can last several days to a few weeks. At certain times of the day your discomfort may be more intense.   Did you receive a nerve block?  A nerve block can provide pain relief for one hour to two days after your surgery. As long as the nerve block is working, you will experience little or no sensation in the area the surgeon operated on.  As the nerve block wears off, you will begin to experience pain or discomfort. It is very important that you begin taking your prescribed pain medication before the nerve block fully wears off. Treating your pain at the first sign of the block wearing off will ensure your pain is better controlled and more tolerable when full-sensation returns. Do not wait until the pain is intolerable, as the medicine will be less effective. It is better to treat pain in advance than to try and catch up.   General Anesthesia:  If you did not receive a nerve block during your surgery, you will need to start taking your pain medication shortly after your surgery and should continue  to do so as prescribed by your surgeon.     ICE AND ELEVATION: You may use ice for the first 48-72 hours, but it is not critical.   Motion of your fingers is very important to decrease the swelling.  Elevation, as much as possible for the next 48 hours, is critical for decreasing swelling as well as for pain relief. Elevation means when you are seated or lying down, you hand should be at or above your heart. When walking, the hand needs to be at or above the level of your elbow.  If the bandage gets too tight, it may need to be loosened. Please contact our office and we will instruct you in how to do this.    SURGICAL BANDAGES:  Keep your dressing and/or splint clean and dry at all times.  Do not remove until you are seen again in the office.  If careful, you may place a plastic bag over your bandage and tape the end to shower, but be careful, do not get your bandages wet.     HAND THERAPY:  You will be contacted to set up your first therapy visit.    ACTIVITY AND WORK: You are encouraged to move any fingers which are not in the bandage.  Light use of the fingers is allowed to assist the other hand with daily hygiene and eating, but strong gripping or lifting is often uncomfortable and should be avoided.  You might miss a variable  period of time from work and hopefully this issue has been discussed prior to surgery. You may not do any heavy work with your affected hand for about 2 weeks.    [facility] [address] Floor, [address] [place], [place] [address] [phone]  Post Anesthesia Home Care Instructions  Activity: Get plenty of rest for the remainder of the day. A responsible individual must stay with you for 24 hours following the procedure.  For the next 24 hours, DO NOT: -Drive a car -Advertising copywriter -Drink alcoholic beverages -Take any medication unless instructed by your physician -Make any legal decisions or sign important papers.  Meals: Start with  liquid foods such as gelatin or soup. Progress to regular foods as tolerated. Avoid greasy, spicy, heavy foods. If nausea and/or vomiting occur, drink only clear liquids until the nausea and/or vomiting subsides. Call your physician if vomiting continues.  Special Instructions/Symptoms: Your throat may feel dry or sore from the anesthesia or the breathing tube placed in your throat during surgery. If this causes discomfort, gargle with warm salt water . The discomfort should disappear within 24 hours.  If you had a scopolamine patch placed behind your ear for the management of post- operative nausea and/or vomiting:  1. The medication in the patch is effective for 72 hours, after which it should be removed.  Wrap patch in a tissue and discard in the trash. Wash hands thoroughly with soap and water . 2. You may remove the patch earlier than 72 hours if you experience unpleasant side effects which may include dry mouth, dizziness or visual disturbances. 3. Avoid touching the patch. Wash your hands with soap and water  after contact with the patch.       Regional Anesthesia Blocks  1. You may not be able to move or feel the blocked extremity after a regional anesthetic block. This may last may last from 3-48 hours after placement, but it will go away. The length of time depends on the medication injected and your individual response to the medication. As the nerves start to wake up, you may experience tingling as the movement and feeling returns to your extremity. If the numbness and inability to move your extremity has not gone away after 48 hours, please call your surgeon.   2. The extremity that is blocked will need to be protected until the numbness is gone and the strength has returned. Because you cannot feel it, you will need to take extra care to avoid injury. Because it may be weak, you may have difficulty moving it or using it. You may not know what position it is in without looking at it  while the block is in effect.  3. For blocks in the legs and feet, returning to weight bearing and walking needs to be done carefully. You will need to wait until the numbness is entirely gone and the strength has returned. You should be able to move your leg and foot normally before you try and bear weight or walk. You will need someone to be with you when you first try to ensure you do not fall and possibly risk injury.  4. Bruising and tenderness at the needle site are common side effects and will resolve in a few days.  5. Persistent numbness or new problems with movement should be communicated to the surgeon or the [facility] [phone] [facility] [phone]).

## 2024-08-02 ENCOUNTER — Encounter (HOSPITAL_BASED_OUTPATIENT_CLINIC_OR_DEPARTMENT_OTHER): Payer: Self-pay | Admitting: Orthopedic Surgery

## 2024-08-05 NOTE — Progress Notes (Signed)
   THERAPIST PROGRESS NOTE Virtual Visit via Video Note  I connected with [name] on 07/25/2024 at  1:00 PM EDT by a video enabled telemedicine application and verified that I am speaking with the correct person using two identifiers.  Location: Patient: home Provider: office   I discussed the limitations of evaluation and management by telemedicine and the availability of in person appointments. The patient expressed understanding and agreed to proceed.   Follow Up Instructions: I discussed the assessment and treatment plan with the patient. The patient was provided an opportunity to ask questions and all were answered. The patient agreed with the plan and demonstrated an understanding of the instructions.   The patient was advised to call back or seek an in-person evaluation if the symptoms worsen or if the condition fails to improve as anticipated.   Session Time: 60 min  Participation Level: Active  Behavioral Response: CasualAlertAnxious  Type of Therapy: Individual Therapy  Treatment Goals addressed: client will engage in at least 80% of scheduled individual psychotherapy sessions  ProgressTowards Goals: Progressing  Interventions: CBT  Summary:  NIYAM BISPING is a 38 y.o. male who presents for the scheduled appointment oriented times five,appropriately dressed and friendly. Client denied hallucinations and delusions. Client reported he is doing okay. Client reported he is working on making a routine. Client reported since he has been unable to work out he has to figure out how to keep himself actively engaged. Client reported exercises has always been his preferred way to cope with his energy and staying on track with getting things done. Client reported he gets bored easily. Client reported he goes for a walk but it gets boring going to the same place. Client reported being stagnant he does not feel productive or lacks making progress. Client reported he needs to  do something that makes him feel like he has had a day. Client reported with the surgery coming up he feels anxious about it and how he will recover. Evidence of progress towards goal:  client reported 1 positive of trying to be creative creating a daily schedule for himself.   Suicidal/Homicidal: Nowithout intent/plan  Therapist Response:  Therapist began the appointment asking how he has been doing. Therapist engaged using active listening and positive emotional support. Therapist used cbt to ask the client how he is coping adjusting to his limited mobility. Therapist used cbt to teach the client about flexibility. Therapist used CBT ask the client to identify his progress with frequency of use with coping skills with continued practice in his daily activity.      Plan: Return again in 4 weeks.  Diagnosis: ADHD, inattentive  Collaboration of Care: Patient refused AEB none requested by the client.  Patient/Guardian was advised Release of Information must be obtained prior to any record release in order to collaborate their care with an outside provider. Patient/Guardian was advised if they have not already done so to contact the registration department to sign all necessary forms in order for us  to release information regarding their care.   Consent: Patient/Guardian gives verbal consent for treatment and assignment of benefits for services provided during this visit. Patient/Guardian expressed understanding and agreed to proceed.   Kaedan Richert Y Janila Arrazola, LCSW 07/25/2024

## 2024-08-15 ENCOUNTER — Ambulatory Visit: Payer: MEDICAID | Admitting: Internal Medicine

## 2024-08-15 ENCOUNTER — Encounter: Payer: Self-pay | Admitting: Internal Medicine

## 2024-08-15 VITALS — BP 110/76 | HR 64 | Resp 18 | Ht 77.0 in | Wt 234.0 lb

## 2024-08-15 DIAGNOSIS — R5383 Other fatigue: Secondary | ICD-10-CM

## 2024-08-15 DIAGNOSIS — R140 Abdominal distension (gaseous): Secondary | ICD-10-CM

## 2024-08-15 DIAGNOSIS — I10 Essential (primary) hypertension: Secondary | ICD-10-CM

## 2024-08-15 NOTE — Progress Notes (Unsigned)
    Subjective:    Patient ID: John [name], male   DOB: 12/05/1986, 38 y.o.   MRN: [id number]   HPI   Left shoulder and ulnar neuropathies:  ultimately, ended up with Drs. Benfield and Norris, ortho.  Recently, underwent cubital tunnel release with anterior transposition of ulnar nerve on left.  Plans for arthroscopic surgery of left shoulder for tear of glenoid labrum in future.  Numbness in left ulnar distribution improving since surgery.    2.  Hypertension:  Doing well with amlodipine .  3.  Has been doing reading about ADHD and Autistic spectrum and celiac disease--that the former diagnoses are set off by having celiac disease.  No rashes other than eczema on legs, no joint swelling or redness.  No abdominal bloating or pain, but then shares this is after he went gluten free about 3 months ago  Has noted bloating going away as well as brain fog and increased energy.  No regular diarrhea.    Current Meds  Medication Sig   amLODipine  (NORVASC ) 5 MG tablet Take 1 tablet (5 mg total) by mouth daily.   B Complex Vitamins (VITAMIN B COMPLEX) CAPS Take 250 capsules by mouth daily.   loratadine  (CLARITIN ) 10 MG tablet Take 10 mg by mouth as needed for allergies.   Magnesium  300 MG CAPS Take 1,200 mg by mouth daily. 4 caps by mouth daily   methylphenidate  (RITALIN ) 5 MG tablet Take 1 tablet (5 mg total) by mouth 2 (two) times daily with breakfast and lunch.   mometasone  (NASONEX ) 50 MCG/ACT nasal spray 2 sprays each nostril daily during your allergy seasons.   Multiple Vitamin (MULTI-VITAMIN DAILY PO) Take by mouth.   Omega-3 Fatty Acids (FISH OIL) 1000 MG CAPS Take by mouth.   THEANINE PO Take 1,000 mg by mouth. Every other day   triamcinolone  cream (KENALOG ) 0.1 % Apply twice daily to affected areas as needed.   Tyrosine 500 MG TABS Take 1 tablet by mouth every other day. Every other day   No Known Allergies   Review of Systems    Objective:   BP 110/76 (BP Location: Left Arm,  Patient Position: Sitting, Cuff Size: Large)   Pulse 64   Resp 18   Ht 6' 5 (1.956 m)   Wt 234 lb (106.1 kg)   BMI 27.75 kg/m   Physical Exam   Assessment & Plan

## 2024-08-17 LAB — IGA: IgA/Immunoglobulin A, Serum: 281 mg/dL (ref 90–386)

## 2024-08-17 LAB — TISSUE TRANSGLUTAMINASE, IGA: Transglutaminase IgA: <2 U/mL (ref 0–3)

## 2024-08-22 ENCOUNTER — Other Ambulatory Visit (HOSPITAL_COMMUNITY): Payer: Self-pay | Admitting: Physician Assistant

## 2024-08-22 DIAGNOSIS — F900 Attention-deficit hyperactivity disorder, predominantly inattentive type: Secondary | ICD-10-CM

## 2024-08-30 ENCOUNTER — Telehealth (INDEPENDENT_AMBULATORY_CARE_PROVIDER_SITE_OTHER): Payer: MEDICAID | Admitting: Physician Assistant

## 2024-08-30 DIAGNOSIS — F411 Generalized anxiety disorder: Secondary | ICD-10-CM | POA: Diagnosis not present

## 2024-08-30 DIAGNOSIS — F900 Attention-deficit hyperactivity disorder, predominantly inattentive type: Secondary | ICD-10-CM | POA: Diagnosis not present

## 2024-08-30 DIAGNOSIS — F3177 Bipolar disorder, in partial remission, most recent episode mixed: Secondary | ICD-10-CM | POA: Diagnosis not present

## 2024-09-01 ENCOUNTER — Encounter (HOSPITAL_COMMUNITY): Payer: Self-pay | Admitting: Physician Assistant

## 2024-09-01 MED ORDER — METHYLPHENIDATE HCL 5 MG PO TABS: 5.0000 mg | ORAL_TABLET | Freq: Two times a day (BID) | ORAL | 0 refills | Status: DC

## 2024-09-01 NOTE — Progress Notes (Signed)
 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 Patient is currently being managed on the following medication: Methylphenidate  (Ritalin ) 5 mg 2 times daily.  Patient presents to the encounter stating that things have been going well.  He reports that he last took his Ritalin  2 to 3 weeks ago.  When he was taking his Ritalin  regularly, patient reports that the medication was helpful in managing his focus and concentration.  He reports that the medication takes roughly an hour to kick in.  He reports that the medication lasts him a little over 8 hours.  Patient denies experiencing any adverse side effects associated with his use of Ritalin .  Patient denies overt depressive symptoms nor does he endorse anxiety.  Patient states that he is currently recovering from surgery that was performed on his left arm/shoulder.  Patient also reports that he is dealing with some generalized stressors.  A PHQ-9 screen was performed with the patient scoring a 0.  A GAD-7 screen was also performed with the patient scoring a 1.  Patient is alert and oriented x 4, calm, cooperative, and fully engaged in conversation during the encounter.  Patient endorses tired mood.  Patient exhibits euthymic mood with appropriate affect.  Patient denies suicidal or homicidal ideations.  He further denies auditory or visual hallucinations and does not appear to be responding to internal/external stimuli.  Patient endorses good sleep and receives on average 6 to 7 hours of sleep per night.   Patient endorses good appetite and eats about 2 meals per day.  Patient endorses alcohol consumption on occasion.  Patient denies tobacco use or illicit drug use.  Visit Diagnosis:    ICD-10-CM   1. 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  Patient also has been diagnosed with bipolar disorder in the past.  Patient suspects that he has a history of autism.   Patient denies a past history of hospitalization due to mental health  - Per chart review, patient has had several hospitalizations due to mental health - Per chart review, patient's most recent hospitalization occurred on 03/01/2019.  Since his last hospitalization, patient has been seen several times in the ED as well as [facility]   Patient denies a past history of suicide attempt   Patient denies a past history of homicide attempt  Past Medical History:  Past Medical History:  Diagnosis Date   ADHD (attention deficit hyperactivity disorder)    Bipolar 1 disorder (HCC)    Hypertension    Seasonal allergies    Thyroid disease 2017   Unknown what exactly--[place] in 2017    Past Surgical History:  Procedure Laterality Date   ULNAR TUNNEL RELEASE Left 08/01/2024   Procedure: RELEASE, CUBITAL TUNNEL WITH ANTERIOR TRANSPOSITION;  Surgeon: Romona Harari, MD;  Location: [facility];  Service: Orthopedics;  Laterality: Left;  Left cubital tunnel release with anterior transposition  [facility]   WISDOM TOOTH EXTRACTION      Family Psychiatric History:  Mother - schizophrenia Sister - schizophrenia   Family history of suicide attempt: Patient denies Family history of homicide attempt: Patient denies Family history of substance  abuse: Patient denies  Family History:  Family History  Problem Relation Age of Onset   Hypertension Mother    Schizophrenia Mother    Schizophrenia Sister     Social History:  Social History   Socioeconomic History   Marital status: Single    Spouse name: Not on file   Number of children: 0   Years of education: 15   Highest education level: Some college, no degree  Occupational History   Occupation: Biomedical scientist at [employer]  Tobacco Use   Smoking status: Former    Current packs/day: 0.00    Average packs/day: 0.3 packs/day for 8.0 years (2.0 ttl pk-yrs)    Types: Cigarettes    Start date: 05/02/2011    Quit date: 05/02/2019    Years since quitting: 5.3    Passive exposure: Past   Smokeless tobacco: Never  Vaping Use   Vaping status: Former  Substance and Sexual Activity   Alcohol use: Yes    Comment: 1-2 drinks weekly   Drug use: Not Currently    Types: Marijuana    Comment: None since 2022.   Sexual activity: Not Currently  Other Topics Concern   Not on file  Social History Narrative   Lives alone   Working with [employer] for some financial support.   No significant other at this time.   Social Drivers of Corporate investment banker Strain: Low Risk  (10/06/2023)   Overall Financial Resource Strain (CARDIA)    Difficulty of Paying Living Expenses: Not very hard  Food Insecurity: Food Insecurity Present (10/06/2023)   Hunger Vital Sign    Worried About Running Out of Food in the Last Year: Sometimes true    Ran Out of Food in the Last Year: Sometimes true  Transportation Needs: No Transportation Needs (10/06/2023)   PRAPARE - Administrator, Civil Service (Medical): No    Lack of Transportation (Non-Medical): No  Physical Activity: Sufficiently Active (10/02/2019)   Exercise Vital Sign    Days of Exercise per Week: 7 days    Minutes of Exercise per Session: 120 min  Stress: No Stress Concern Present (10/02/2019)   [employer]  of Occupational Health - Occupational Stress Questionnaire    Feeling of Stress : Not at all  Social Connections: Not on file    Allergies: No Known Allergies  Metabolic Disorder Labs: Lab Results  Component Value Date   HGBA1C 5.4 10/02/2019   No results found for: PROLACTIN Lab Results  Component Value Date   CHOL 160 10/11/2023   TRIG 48 10/11/2023   HDL 48 10/11/2023   CHOLHDL 2.6 07/26/2013   VLDL 8 07/26/2013   LDLCALC 102 (H) 10/11/2023   LDLCALC 75 08/18/2022   Lab Results  Component Value Date   TSH 4.370 10/02/2019   TSH 4.457 07/26/2013    Therapeutic Level Labs: No results found for: LITHIUM Lab Results  Component Value Date   VALPROATE 19 (L) 08/02/2020   VALPROATE <10 (L) 02/24/2019   No results found for: CBMZ  Current Medications: Current Outpatient Medications  Medication Sig Dispense Refill   amLODipine  (NORVASC ) 5 MG tablet Take 1 tablet (5 mg total) by mouth daily. 90 tablet 3  atomoxetine  (STRATTERA ) 40 MG capsule Take 1 capsule (40 mg total) by mouth daily. (Patient not taking: Reported on 07/27/2024) 30 capsule 1   B Complex Vitamins (VITAMIN B COMPLEX) CAPS Take 250 capsules by mouth daily.     loratadine  (CLARITIN ) 10 MG tablet Take 10 mg by mouth as needed for allergies.     Magnesium  300 MG CAPS Take 1,200 mg by mouth daily. 4 caps by mouth daily     methylphenidate  (RITALIN ) 5 MG tablet Take 1 tablet (5 mg total) by mouth 2 (two) times daily with breakfast and lunch. 60 tablet 0   mometasone  (NASONEX ) 50 MCG/ACT nasal spray 2 sprays each nostril daily during your allergy seasons. 17 g 11   Multiple Vitamin (MULTI-VITAMIN DAILY PO) Take by mouth.     Omega-3 Fatty Acids (FISH OIL) 1000 MG CAPS Take by mouth.     THEANINE PO Take 1,000 mg by mouth. Every other day     triamcinolone  cream (KENALOG ) 0.1 % Apply twice daily to affected areas as needed. 80 g 3   Tyrosine 500 MG TABS Take 1 tablet by mouth every other day. Every other day      No current facility-administered medications for this visit.     Musculoskeletal: Strength & Muscle Tone: within normal limits Gait & Station: normal Patient leans: N/A  Psychiatric Specialty Exam: Review of Systems  Psychiatric/Behavioral:  Positive for decreased concentration. Negative for dysphoric mood, hallucinations, self-injury, sleep disturbance and suicidal ideas. The patient is not nervous/anxious and is not hyperactive.     There were no vitals taken for this visit.There is no height or weight on file to calculate BMI.  General Appearance: Casual  Eye Contact:  Good  Speech:  Clear and Coherent and Normal Rate  Volume:  Normal  Mood:  Euthymic  Affect:  Appropriate  Thought Process:  Coherent, Goal Directed, and Descriptions of Associations: Intact  Orientation:  Full (Time, Place, and Person)  Thought Content: WDL   Suicidal Thoughts:  No  Homicidal Thoughts:  No  Memory:  Immediate;   Good Recent;   Good Remote;   Good  Judgement:  Good  Insight:  Good  Psychomotor Activity:  Normal  Concentration:  Concentration: Good and Attention Span: Good  Recall:  Good  Fund of Knowledge: Good  Language: Good  Akathisia:  No  Handed:  Left  AIMS (if indicated): not done  Assets:  Communication Skills Desire for Improvement Housing Social Support Vocational/Educational  ADL's:  Intact  Cognition: WNL  Sleep:  Good   Screenings: AIMS    Flowsheet Row [facility]  AIMS Total Score 0   AUDIT    Flowsheet Row [facility]  Alcohol Use Disorder Identification Test Final Score (AUDIT) 6 2   CAGE-AID    Flowsheet Row ED to Hosp-Admission (Discharged) from 07/20/2020 in [facility]  SURGICAL  CAGE-AID  Score 2   GAD-7    Flowsheet Row Video Visit from 08/30/2024 in [facility] Clinical Support from 06/20/2024 in [facility] Clinical Support from 05/09/2024 in [facility] Clinical Support from 03/28/2024 in [facility] Clinical Support from 02/22/2024 in [facility]  Total GAD-7 Score 1 1 7 6  0  [id number]    Flowsheet Row Video Visit from 08/30/2024 in [facility] Clinical Support from 06/20/2024 in [facility] Clinical Support from 05/09/2024 in [facility] Clinical Support from 03/28/2024 in [facility] Clinical Support from 02/22/2024 in [facility]  PHQ-2 Total Score 0 1 1 0 2  PHQ-9 Total Score -- -- -- -- 8   Flowsheet Row Video Visit from 08/30/2024 in [facility] Admission (Discharged) from 08/01/2024 in [facility] Clinical Support from 06/20/2024 in [facility]  C-SSRS RISK CATEGORY No Risk No Risk No Risk     Assessment and Plan:   John [name] is a 38 year old male with a past psychiatric history significant for bipolar 1 disorder, generalized anxiety disorder, and 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  Patient presents to the encounter stating that he ran out of his Ritalin  prescription prior to this encounter.  He reports that when he was taking his Ritalin  regularly, he reports that the medication was effective in managing his focus and concentration.  He denies experiencing any adverse side effects associated with the use of Ritalin .  Patient denies overt depressive symptoms nor does he endorse anxiety.  A PHQ-9  screen was performed with the patient scoring a 0.  A GAD-7 screen was also performed with the patient scoring a 1.  Patient endorses stability through the use of his Ritalin  and would like to continue taking the medication as prescribed.  Patient's medication to be e-prescribed to pharmacy of choice.  A [place] Suicide Severity Rating Scale was performed with the patient being considered no risk.  Patient denies suicidal ideations and is able to contract for safety at this time.   Collaboration of Care: Collaboration of Care: Psychiatrist AEB patient being followed by a mental health provider at this facility and Referral or follow-up with counselor/therapist AEB patient being seen by licensed clinical social worker at this facility  Patient/Guardian was advised Release of Information must be obtained prior to any record release in order to collaborate their care with an outside provider. Patient/Guardian was advised if they have not already done so to contact the registration department to sign all necessary forms in order for us  to release information regarding their care.   Consent: Patient/Guardian gives verbal consent for treatment and assignment of benefits for services provided during this visit. Patient/Guardian expressed understanding and agreed to proceed.   1. Attention deficit hyperactivity disorder (ADHD), predominantly inattentive type  - methylphenidate  (RITALIN ) 5 MG tablet; Take 1 tablet (5 mg total) by mouth 2 (two) times daily with breakfast and lunch.  Dispense: 60 tablet; Refill: 0  2. GAD (generalized anxiety disorder) (Primary)  3. Bipolar 1 disorder, mixed, partial remission (HCC)  Patient to follow up in 7 weeks Provider spent a total of 18 minutes with the patient/reviewing patient's chart  Reginia FORBES Bolster, PA 08/30/2024, 10:30 AM

## 2024-09-05 ENCOUNTER — Ambulatory Visit (HOSPITAL_COMMUNITY)
Admission: EM | Admit: 2024-09-05 | Discharge: 2024-09-05 | Disposition: A | Discharge status: Home / Self Care | Payer: MEDICAID

## 2024-09-05 NOTE — ED Notes (Signed)
Patient left prior to triage.

## 2024-09-06 ENCOUNTER — Encounter (HOSPITAL_COMMUNITY): Payer: Self-pay

## 2024-09-06 ENCOUNTER — Telehealth (HOSPITAL_COMMUNITY): Payer: Self-pay | Admitting: Clinical

## 2024-09-06 ENCOUNTER — Ambulatory Visit (HOSPITAL_COMMUNITY): Payer: MEDICAID | Admitting: Clinical

## 2024-09-06 NOTE — Telephone Encounter (Signed)
 Patient came in person to the office to have his appointment virtual via office laptop. When he was informed that he missed his 8:00 am with Paige. Patient stated that he would never schedule an appointment that early and he wanted to see Paige at that time. Luke and [place] tried to explain to the patient that he was notified yesterday when he walked in that his appointment was today at 8:00. Patient was impatient with [place] while Luke continued to explain that the appointment was confirmed on 09/24 that it would be at 8 am today on a laptop. While the conversation was happening, Anjenette asked Shawn to come to the front to help. As the patient was leaving, Elouise arrived and [place] and the patient spoke privately. Shawn brought the patient back up front to schedule another appointment. The patient will be coming in on November 3 at 3 pm to see Carlyon and was informed of an appointment with Lytle on November 5 at 1:30. Patient left with a card and a printout detailing appointments.

## 2024-09-06 NOTE — Telephone Encounter (Signed)
 Client had a schedule virtual therapy visit today at 8am. Therapist sent the client a link via mychart to the mobile number on file. Client did not check in using the link. Therapist is working remotely from home and attempted phone call to the client via private call. Client did not answer therapist left a HIPAA compliant voicemail stating reason for call and to return call to the office. Therapist also asked admin staff to contact him via phone call and was unsuccessful in making contact bu tleft a voicemail as well.

## 2024-09-27 ENCOUNTER — Ambulatory Visit (HOSPITAL_COMMUNITY): Payer: MEDICAID | Admitting: Clinical

## 2024-10-10 ENCOUNTER — Encounter (HOSPITAL_COMMUNITY): Payer: Self-pay | Admitting: Physician Assistant

## 2024-10-10 ENCOUNTER — Ambulatory Visit (HOSPITAL_COMMUNITY): Payer: MEDICAID | Admitting: Physician Assistant

## 2024-10-10 VITALS — BP 154/90 | HR 69 | Temp 98.1°F | Resp 100 | Ht 77.0 in | Wt 216.6 lb

## 2024-10-10 DIAGNOSIS — F900 Attention-deficit hyperactivity disorder, predominantly inattentive type: Secondary | ICD-10-CM

## 2024-10-10 DIAGNOSIS — F411 Generalized anxiety disorder: Secondary | ICD-10-CM

## 2024-10-10 DIAGNOSIS — F3177 Bipolar disorder, in partial remission, most recent episode mixed: Secondary | ICD-10-CM

## 2024-10-10 MED ORDER — METHYLPHENIDATE HCL 5 MG PO TABS: 5.0000 mg | ORAL_TABLET | Freq: Two times a day (BID) | ORAL | 0 refills | Status: AC

## 2024-10-10 NOTE — Progress Notes (Signed)
 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 Patient is currently being managed on the following medication: Methylphenidate  (Ritalin ) 5 mg 2 times daily.  Patient presents to the encounter stating that he has been taking his medication regularly and denies experiencing any adverse side effects associated with the use of his medication.  He does report that the identification number on his pills for his Ritalin  has changed from what he was previously taking.  He believes that his previous prescriptions were more helpful than the pills that he is taking now.  Provider informed patient that there is no way of determining what type of pills he gets from the pharmacy.  Provider informed patient that his medications could be switched to a different pharmacy to see if he gets a different pill from a different manufacturer.  Patient was open to switching pharmacies.  Patient denies overt depressive symptoms nor does he endorse changes in his mood or behavior.  Patient states that his anxiety has been manageable at this time.  He reports that he was diagnosed with autism when placed in a correctional facility at [facility]  Provider informed patient to provide documentation of this diagnosis during his next encounter.  Patient vocalized understanding.  A PHQ-2 screen was performed the patient scoring a 0.  A GAD-7 screen was also performed with the patient scoring of 0.  Patient is alert and oriented x 4, calm, cooperative, and fully engaged in conversation  during the encounter.  Patient describes his mood as chill.  Patient exhibits euthymic mood with appropriate affect.  Patient denies suicidal or homicidal ideations.  He further denies auditory or visual hallucinations and does not appear to be responding to internal/external stimuli.  Patient endorses good sleep and receives on average 6 to 7 hours of sleep per night.  Patient endorses good appetite and eats on average 2-3 meals per day.  Patient endorses alcohol consumption sparingly.  Patient denies tobacco use.  Patient denies illicit drug use but states that he does use CBD.  Visit Diagnosis:    ICD-10-CM   1. GAD (generalized anxiety disorder)  F41.1     2. Attention deficit hyperactivity disorder (ADHD), predominantly inattentive type  F90.0 Urine Drug Panel 7    methylphenidate  (RITALIN ) 5 MG tablet    Urine Drug Panel 7    3. 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  Patient also has been diagnosed with bipolar disorder in the past.  Patient suspects that he has a history of autism.   Patient denies a past history of hospitalization due to mental health  - Per chart review, patient has had several hospitalizations due to mental health - Per chart review, patient's most recent hospitalization occurred on 03/01/2019.  Since his last hospitalization, patient has been seen several times in the ED as well as [facility]   Patient denies a past history of suicide attempt  Patient denies a past history of homicide attempt  Past Medical History:  Past Medical History:  Diagnosis Date   ADHD (attention deficit hyperactivity disorder)    Bipolar 1 disorder (HCC)    Hypertension    Seasonal allergies    Thyroid disease 2017   Unknown what exactly--[place] in 2017     Past Surgical History:  Procedure Laterality Date   ULNAR TUNNEL RELEASE Left 08/01/2024   Procedure: RELEASE, CUBITAL TUNNEL WITH ANTERIOR TRANSPOSITION;  Surgeon: Romona Harari, MD;  Location: [facility];  Service: Orthopedics;  Laterality: Left;  Left cubital tunnel release with anterior transposition [facility]   WISDOM TOOTH EXTRACTION      Family Psychiatric History:  Mother - schizophrenia Sister - schizophrenia   Family history of suicide attempt: Patient denies Family history of homicide attempt: Patient denies Family history of substance abuse: Patient denies  Family History:  Family History  Problem Relation Age of Onset   Hypertension Mother    Schizophrenia Mother    Schizophrenia Sister     Social History:  Social History   Socioeconomic History   Marital status: Single    Spouse name: Not on file   Number of children: 0   Years of education: 15   Highest education level: Some college, no degree  Occupational History   Occupation: biomedical scientist at [employer]  Tobacco Use   Smoking status: Former    Current packs/day: 0.00    Average packs/day: 0.3 packs/day for 8.0 years (2.0 ttl pk-yrs)    Types: Cigarettes    Start date: 05/02/2011    Quit date: 05/02/2019    Years since quitting: 5.4    Passive exposure: Past   Smokeless tobacco: Never  Vaping Use   Vaping status: Former  Substance and Sexual Activity   Alcohol use: Yes    Comment: 1-2 drinks weekly   Drug use: Not Currently    Types: Marijuana    Comment: None since 2022.   Sexual activity: Not Currently  Other Topics Concern   Not on file  Social History Narrative   Lives alone   Working with [employer] for some financial support.   No significant other at this time.   Social Drivers of Corporate Investment Banker Strain: Low Risk  (10/06/2023)   Overall Financial Resource Strain (CARDIA)    Difficulty of Paying Living Expenses: Not very hard  Food  Insecurity: Food Insecurity Present (10/06/2023)   Hunger Vital Sign    Worried About Running Out of Food in the Last Year: Sometimes true    Ran Out of Food in the Last Year: Sometimes true  Transportation Needs: No Transportation Needs (10/06/2023)   PRAPARE - Administrator, Civil Service (Medical): No    Lack of Transportation (Non-Medical): No  Physical Activity: Sufficiently Active (10/02/2019)   Exercise Vital Sign    Days of Exercise per Week: 7 days    Minutes of Exercise per Session: 120 min  Stress: No Stress Concern Present (10/02/2019)   [employer] of Occupational Health - Occupational Stress Questionnaire    Feeling of Stress : Not at all  Social Connections: Not on file    Allergies: No Known Allergies  Metabolic Disorder Labs: Lab Results  Component Value Date   HGBA1C 5.4 10/02/2019   No results found for: PROLACTIN Lab Results  Component Value Date   CHOL 160 10/11/2023   TRIG 48 10/11/2023   HDL 48 10/11/2023  CHOLHDL 2.6 07/26/2013   VLDL 8 07/26/2013   LDLCALC 102 (H) 10/11/2023   LDLCALC 75 08/18/2022   Lab Results  Component Value Date   TSH 4.370 10/02/2019   TSH 4.457 07/26/2013    Therapeutic Level Labs: No results found for: LITHIUM Lab Results  Component Value Date   VALPROATE 19 (L) 08/02/2020   VALPROATE <10 (L) 02/24/2019   No results found for: CBMZ  Current Medications: Current Outpatient Medications  Medication Sig Dispense Refill   amLODipine  (NORVASC ) 5 MG tablet Take 1 tablet (5 mg total) by mouth daily. 90 tablet 3   atomoxetine  (STRATTERA ) 40 MG capsule Take 1 capsule (40 mg total) by mouth daily. (Patient not taking: Reported on 07/27/2024) 30 capsule 1   B Complex Vitamins (VITAMIN B COMPLEX) CAPS Take 250 capsules by mouth daily.     loratadine  (CLARITIN ) 10 MG tablet Take 10 mg by mouth as needed for allergies.     Magnesium  300 MG CAPS Take 1,200 mg by mouth daily. 4 caps by mouth daily      methylphenidate  (RITALIN ) 5 MG tablet Take 1 tablet (5 mg total) by mouth 2 (two) times daily with breakfast and lunch. 60 tablet 0   mometasone  (NASONEX ) 50 MCG/ACT nasal spray 2 sprays each nostril daily during your allergy seasons. 17 g 11   Multiple Vitamin (MULTI-VITAMIN DAILY PO) Take by mouth.     Omega-3 Fatty Acids (FISH OIL) 1000 MG CAPS Take by mouth.     THEANINE PO Take 1,000 mg by mouth. Every other day     triamcinolone  cream (KENALOG ) 0.1 % Apply twice daily to affected areas as needed. 80 g 3   Tyrosine 500 MG TABS Take 1 tablet by mouth every other day. Every other day     No current facility-administered medications for this visit.     Musculoskeletal: Strength & Muscle Tone: within normal limits Gait & Station: normal Patient leans: N/A  Psychiatric Specialty Exam: Review of Systems  Psychiatric/Behavioral:  Negative for decreased concentration, dysphoric mood, hallucinations, self-injury, sleep disturbance and suicidal ideas. The patient is not nervous/anxious and is not hyperactive.     Blood pressure (!) 154/90, pulse 69, temperature 98.1 F (36.7 C), temperature source Oral, resp. rate (!) 100, height 6' 5 (1.956 m), weight 216 lb 9.6 oz (98.2 kg).Body mass index is 25.69 kg/m.  General Appearance: Casual  Eye Contact:  Good  Speech:  Clear and Coherent and Normal Rate  Volume:  Normal  Mood:  Euthymic  Affect:  Appropriate  Thought Process:  Coherent, Goal Directed, and Descriptions of Associations: Intact  Orientation:  Full (Time, Place, and Person)  Thought Content: WDL   Suicidal Thoughts:  No  Homicidal Thoughts:  No  Memory:  Immediate;   Good Recent;   Good Remote;   Good  Judgement:  Good  Insight:  Good  Psychomotor Activity:  Normal  Concentration:  Concentration: Good and Attention Span: Good  Recall:  Good  Fund of Knowledge: Good  Language: Good  Akathisia:  No  Handed:  Left  AIMS (if indicated): not done  Assets:  Communication  Skills Desire for Improvement Housing Social Support Vocational/Educational  ADL's:  Intact  Cognition: WNL  Sleep:  Good   Screenings: AIMS    Flowsheet Row [facility]  AIMS Total Score 0   AUDIT    Flowsheet Row [facility]  Alcohol Use Disorder Identification Test Final Score (AUDIT) 6 2   CAGE-AID    Flowsheet Row ED to Hosp-Admission (Discharged) from 07/20/2020 in [facility]  SURGICAL  CAGE-AID Score 2   GAD-7    Flowsheet Row Clinical Support from 10/10/2024 in [facility] Video Visit from 08/30/2024 in [facility] Clinical Support from 06/20/2024 in [facility] Clinical Support from 05/09/2024 in [facility] Clinical Support from 03/28/2024 in [facility]  Total GAD-7 Score 0 1 1 7 6    PHQ2-9    Flowsheet Row Clinical Support from 10/10/2024 in [facility] Video Visit from 08/30/2024 in [facility] Clinical Support from 06/20/2024 in [facility] Clinical Support from 05/09/2024 in [facility] Clinical Support from 03/28/2024 in [facility]  PHQ-2 Total Score 0 0 1 1 0   Flowsheet Row Clinical Support from 10/10/2024 in [facility] Video Visit from 08/30/2024 in [facility] Admission (Discharged) from 08/01/2024 in [facility]  C-SSRS RISK CATEGORY No Risk No Risk No Risk     Assessment and Plan:   John [name] is a 38 year old male with a past psychiatric history significant for  bipolar 1 disorder, generalized anxiety disorder, and 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  Patient presents to the encounter stating that he continues to take his medication (Ritalin ) as prescribed and denies experiencing any adverse side effects.  In regards to his use of Ritalin , patient reports that the identification number on his pills have changed from what he was previously taking.  Patient believes that the previous Ritalin  pills (with a different ID number) was more effective in managing his symptoms than what he is currently taking now.  Provider informed patient that there was no way of determining the manufacturer of his Ritalin  when ordered.  Provider informed patient that his Ritalin  could be sent to a different pharmacy to see if he would be dispensed a different manufacturer's pills.  Patient vocalized understanding.  Patient denies overt depressive symptoms nor does he endorse any changes in his behavior.  Patient reports that his anxiety has been manageable at this time.  A PHQ-2 screen was performed with the patient scoring a 0.  A GAD-7 screen was also performed the patient scoring a 0.  Patient endorses stability on his use of Ritalin  and would like to continue taking the medication as prescribed.  Provider informed patient that a urine drug screen would need to be obtained due to his use of Ritalin .  Patient verbalized understanding.  A [place] Suicide Severity Rating Scale was performed with the patient being considered no risk.  Patient denies suicidal ideations and is able to contract for safety at this time.   Collaboration of Care: Collaboration of Care: Psychiatrist AEB patient being followed by a mental health provider at this facility and Referral or follow-up with counselor/therapist AEB patient being seen by licensed clinical  social worker at this facility  Patient/Guardian was advised Release of Information must be obtained prior to any record release in order to collaborate their care with an outside provider. Patient/Guardian was advised if they have not already done so to  contact the registration department to sign all necessary forms in order for us  to release information regarding their care.   Consent: Patient/Guardian gives verbal consent for treatment and assignment of benefits for services provided during this visit. Patient/Guardian expressed understanding and agreed to proceed.   1. Attention deficit hyperactivity disorder (ADHD), predominantly inattentive type  - Urine Drug Panel 7; Future - methylphenidate  (RITALIN ) 5 MG tablet; Take 1 tablet (5 mg total) by mouth 2 (two) times daily with breakfast and lunch.  Dispense: 60 tablet; Refill: 0  2. GAD (generalized anxiety disorder) (Primary)  3. Bipolar 1 disorder, mixed, partial remission (HCC)  Patient to follow up in 2 months Provider spent a total of 18 minutes with the patient/reviewing patient's chart  John FORBES Bolster, PA 10/10/2024, 9:00 AM

## 2024-10-13 ENCOUNTER — Ambulatory Visit: Payer: Self-pay | Admitting: Internal Medicine

## 2024-10-13 NOTE — Progress Notes (Signed)
 No show for appt.

## 2024-10-15 ENCOUNTER — Ambulatory Visit (INDEPENDENT_AMBULATORY_CARE_PROVIDER_SITE_OTHER): Payer: MEDICAID | Admitting: Clinical

## 2024-10-15 DIAGNOSIS — F900 Attention-deficit hyperactivity disorder, predominantly inattentive type: Secondary | ICD-10-CM

## 2024-10-16 ENCOUNTER — Other Ambulatory Visit (HOSPITAL_COMMUNITY): Payer: Self-pay | Admitting: Physician Assistant

## 2024-10-16 ENCOUNTER — Other Ambulatory Visit: Payer: Self-pay | Admitting: Internal Medicine

## 2024-10-16 DIAGNOSIS — F900 Attention-deficit hyperactivity disorder, predominantly inattentive type: Secondary | ICD-10-CM

## 2024-10-17 ENCOUNTER — Telehealth (HOSPITAL_COMMUNITY): Payer: MEDICAID | Admitting: Physician Assistant

## 2024-11-14 ENCOUNTER — Ambulatory Visit (INDEPENDENT_AMBULATORY_CARE_PROVIDER_SITE_OTHER): Payer: MEDICAID | Admitting: Clinical

## 2024-11-14 DIAGNOSIS — F900 Attention-deficit hyperactivity disorder, predominantly inattentive type: Secondary | ICD-10-CM | POA: Diagnosis not present

## 2024-11-14 NOTE — Progress Notes (Signed)
   THERAPIST PROGRESS NOTE  Session Time: 60 min  Participation Level: Active  Behavioral Response: CasualAlertEuthymic  Type of Therapy: Individual Therapy  Treatment Goals addressed: client will engage in at least 80% of scheduled individual psychotherapy sessions  ProgressTowards Goals: Progressing  Interventions: CBT and Supportive  Summary:  John [name] is a 38 y.o. male who presents for scheduled appointment oriented x 5, appropriately dressed, and friendly.  Client denied hallucinations and delusions.  Client is presenting for walk-in therapy appointment.  Client reported on today he is doing pretty well.  Client reported he has been thinking about how the dynamics within his family has affected him.  Client reported recently he had a issue with his brothers that were sitting at his house while he was at home.  Client reported he is under the impression that they deliberately do things to spite him and likewise with his sisters.  Client reported he feels like the underlying issue is that there is some jealousy that his brothers have towards him.  Client reported there has been an issue with him getting along since childhood.  Client reported he was the black sheep in his family and his siblings receive better treatment than he did.  Evidence of progress towards goal:  client reported 1 positive of being able to communicate his thoughts and boundaries appropriately.  Suicidal/Homicidal: Nowithout intent/plan  Therapist Response:  Therapist began the appointment asking how he has been doing. Therapist engaged using active listening and positive emotional support. Therapist used cbt to engage and give him time to discuss his thoughts and feelings. Therapist used cbt to teach the client about boundaries and appropriate assertive communication. Therapist used CBT ask the client to identify her progress with frequency of use with coping skills with continued practice in her daily  activity.    Therapist assigned homework to practice self care.   Plan: Return again in 4 weeks.  Diagnosis: adhd, inattentive  Collaboration of Care: Patient refused AEB none requested by the client.  Patient/Guardian was advised Release of Information must be obtained prior to any record release in order to collaborate their care with an outside provider. Patient/Guardian was advised if they have not already done so to contact the registration department to sign all necessary forms in order for us  to release information regarding their care.   Consent: Patient/Guardian gives verbal consent for treatment and assignment of benefits for services provided during this visit. Patient/Guardian expressed understanding and agreed to proceed.   Barry Faircloth Y Desma Wilkowski, LCSW 11/14/2024

## 2024-11-17 NOTE — Progress Notes (Signed)
   THERAPIST PROGRESS NOTE Virtual Visit via Video Note  I connected with John [name] on 10/15/2024 at  3:00 PM EST by a video enabled telemedicine application and verified that I am speaking with the correct person using two identifiers.  Location: Patient: home Provider: office   I discussed the limitations of evaluation and management by telemedicine and the availability of in person appointments. The patient expressed understanding and agreed to proceed.   Follow Up Instructions: I discussed the assessment and treatment plan with the patient. The patient was provided an opportunity to ask questions and all were answered. The patient agreed with the plan and demonstrated an understanding of the instructions.   The patient was advised to call back or seek an in-person evaluation if the symptoms worsen or if the condition fails to improve as anticipated.   Session Time: 30 min  Participation Level: Active  Behavioral Response: CasualAlertEuthymic  Type of Therapy: Individual Therapy  Treatment Goals addressed: client will engage in at least 80% of scheduled individual psychotherapy sessions  ProgressTowards Goals: Progressing  Interventions: CBT  Summary:  John [name] is a 38 y.o. male who presents for the scheduled appointment oriented times five, appropriately dressed and friendly. Client denied hallucinations and delusion. Client reported he is doing fairly okay. Client reported has been thinking about his future. Client reported wanting to move out of [place] and into a busier city that better suits him. Client discussed his attempt to find common ground when interacting with his family. Client reported since he was a child he was misinterpreted.  Evidence of progress towards goal:  client reported 1 positive of being able to appropriately communicate his thoughts and feelings in interpersonal relationships.   Suicidal/Homicidal: Nowithout  intent/plan  Therapist Response:  Therapist began the appointment asking how he has been doing. Therapist engaged with active listening and positive emotional support. Therapist used cbt to engage and give him time to discuss his thoughts and feeling. Therapist used CBT ask the client to identify his progress with frequency of use with coping skills with continued practice in his daily activity.    Therapist assigned him homework to practice self care.   Plan: Return again in 4 weeks.  Diagnosis: adhd, inattentive  Collaboration of Care: Patient refused AEB none requested.  Patient/Guardian was advised Release of Information must be obtained prior to any record release in order to collaborate their care with an outside provider. Patient/Guardian was advised if they have not already done so to contact the registration department to sign all necessary forms in order for us  to release information regarding their care.   Consent: Patient/Guardian gives verbal consent for treatment and assignment of benefits for services provided during this visit. Patient/Guardian expressed understanding and agreed to proceed.   Mattew Chriswell Y Kittie Krizan, LCSW 10/15/2024

## 2024-11-18 ENCOUNTER — Encounter (HOSPITAL_COMMUNITY): Payer: Self-pay | Admitting: Emergency Medicine

## 2024-11-18 ENCOUNTER — Emergency Department (HOSPITAL_COMMUNITY)
Admission: EM | Admit: 2024-11-18 | Discharge: 2024-11-20 | Disposition: A | Payer: MEDICAID | Attending: Emergency Medicine | Admitting: Emergency Medicine

## 2024-11-18 ENCOUNTER — Other Ambulatory Visit: Payer: Self-pay

## 2024-11-18 DIAGNOSIS — F312 Bipolar disorder, current episode manic severe with psychotic features: Secondary | ICD-10-CM

## 2024-11-18 DIAGNOSIS — F23 Brief psychotic disorder: Secondary | ICD-10-CM

## 2024-11-18 DIAGNOSIS — F29 Unspecified psychosis not due to a substance or known physiological condition: Secondary | ICD-10-CM | POA: Insufficient documentation

## 2024-11-18 LAB — COMPREHENSIVE METABOLIC PANEL WITH GFR
ALT: 26 U/L (ref 0–44)
AST: 46 U/L — ABNORMAL HIGH (ref 15–41)
Albumin: 4.9 g/dL (ref 3.5–5.0)
Alkaline Phosphatase: 71 U/L (ref 38–126)
Anion gap: 13 (ref 5–15)
BUN: 11 mg/dL (ref 6–20)
CO2: 26 mmol/L (ref 22–32)
Calcium: 9.9 mg/dL (ref 8.9–10.3)
Chloride: 99 mmol/L (ref 98–111)
Creatinine, Ser: 1.28 mg/dL — ABNORMAL HIGH (ref 0.61–1.24)
GFR, Estimated: >60 mL/min (ref >60)
Glucose, Bld: 98 mg/dL (ref 70–99)
Potassium: 4.2 mmol/L (ref 3.5–5.1)
Sodium: 138 mmol/L (ref 135–145)
Total Bilirubin: 1.3 mg/dL — ABNORMAL HIGH (ref 0.0–1.2)
Total Protein: 8.5 g/dL — ABNORMAL HIGH (ref 6.5–8.1)

## 2024-11-18 LAB — RAPID URINE DRUG SCREEN, HOSP PERFORMED
Amphetamines: NOT DETECTED
Barbiturates: NOT DETECTED
Benzodiazepines: NOT DETECTED
Cocaine: NOT DETECTED
Opiates: NOT DETECTED
Tetrahydrocannabinol: POSITIVE — AB

## 2024-11-18 LAB — ETHANOL: Alcohol, Ethyl (B): <15 mg/dL (ref <15)

## 2024-11-18 LAB — CBC
HCT: 48.3 % (ref 39.0–52.0)
Hemoglobin: 16.8 g/dL (ref 13.0–17.0)
MCH: 29.8 pg (ref 26.0–34.0)
MCHC: 34.8 g/dL (ref 30.0–36.0)
MCV: 85.6 fL (ref 80.0–100.0)
Platelets: 297 K/uL (ref 150–400)
RBC: 5.64 MIL/uL (ref 4.22–5.81)
RDW: 13.3 % (ref 11.5–15.5)
WBC: 9.5 K/uL (ref 4.0–10.5)
nRBC: 0 % (ref 0.0–0.2)

## 2024-11-18 MED ORDER — AMLODIPINE BESYLATE 5 MG PO TABS
5.0000 mg | ORAL_TABLET | Freq: Every day | ORAL | Status: DC
  Administered 2024-11-18 – 2024-11-20 (×3): 5 mg via ORAL
  Filled 2024-11-18 (×4): qty 1

## 2024-11-18 MED ORDER — LORAZEPAM 1 MG PO TABS
2.0000 mg | ORAL_TABLET | Freq: Four times a day (QID) | ORAL | Status: DC | PRN
  Administered 2024-11-18: 2 mg via ORAL
  Filled 2024-11-18: qty 2

## 2024-11-18 MED ORDER — OLANZAPINE 10 MG PO TABS
10.0000 mg | ORAL_TABLET | Freq: Two times a day (BID) | ORAL | Status: DC
  Administered 2024-11-18 – 2024-11-20 (×4): 10 mg via ORAL
  Filled 2024-11-18 (×4): qty 1

## 2024-11-18 MED ORDER — DIPHENHYDRAMINE HCL 50 MG/ML IJ SOLN
50.0000 mg | Freq: Four times a day (QID) | INTRAMUSCULAR | Status: DC | PRN
  Filled 2024-11-18: qty 1

## 2024-11-18 MED ORDER — LORAZEPAM 2 MG/ML IJ SOLN
2.0000 mg | Freq: Four times a day (QID) | INTRAMUSCULAR | Status: DC | PRN
  Filled 2024-11-18: qty 1

## 2024-11-18 MED ORDER — ZIPRASIDONE MESYLATE 20 MG IM SOLR
20.0000 mg | Freq: Once | INTRAMUSCULAR | Status: AC
  Administered 2024-11-18: 20 mg via INTRAMUSCULAR
  Filled 2024-11-18: qty 20

## 2024-11-18 MED ORDER — METHYLPHENIDATE HCL 5 MG PO TABS: 5.0000 mg | ORAL_TABLET | Freq: Two times a day (BID) | ORAL | Status: DC

## 2024-11-18 MED ORDER — DIPHENHYDRAMINE HCL 25 MG PO CAPS
50.0000 mg | ORAL_CAPSULE | Freq: Every evening | ORAL | Status: DC | PRN
  Administered 2024-11-18: 50 mg via ORAL
  Filled 2024-11-18: qty 2

## 2024-11-18 MED ORDER — HALOPERIDOL 5 MG PO TABS
5.0000 mg | ORAL_TABLET | Freq: Four times a day (QID) | ORAL | Status: DC | PRN
  Administered 2024-11-18: 5 mg via ORAL
  Filled 2024-11-18: qty 1

## 2024-11-18 MED ORDER — OMEGA-3-ACID ETHYL ESTERS 1 G PO CAPS
1.0000 g | ORAL_CAPSULE | Freq: Every day | ORAL | Status: DC
  Filled 2024-11-18 (×2): qty 1

## 2024-11-18 MED ORDER — HALOPERIDOL LACTATE 5 MG/ML IJ SOLN
5.0000 mg | Freq: Four times a day (QID) | INTRAMUSCULAR | Status: DC | PRN
  Filled 2024-11-18: qty 1
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
## 2024-11-18 NOTE — Progress Notes (Signed)
 [facility] LCSW Progress Note   11/18/2024    6:27 PM  JAKUB DEBOLD   [id number]   Type of Contact and Topic:  Psychiatric Bed Placement   Pt accepted to [facility]    Patient meets inpatient criteria per Elveria Batter, NP  The attending provider will be Dr. Odella  Call report to [phone]  Chiquita Cable, RN @ [facility] ED notified.     Pt scheduled  to arrive at [facility] for TOMORROW (12/8).    Bunnie Gallop, MSW, LCSW-A  6:28 PM 11/18/2024

## 2024-11-18 NOTE — ED Notes (Signed)
 IVC PAPERWORK ACCEPTED MY MAGISTRATE WAITING FOR OFFICER TO BRING FINDING AND CUSTODY

## 2024-11-18 NOTE — ED Notes (Signed)
 IVC PAPERWORK COMPLETED COPIES MADE ORIGINAL IN THE RED FOLDER 1 COPY IN THE MEDICAL REC. DRAWER CASE # ADDED TO THE SIDE CHART AND NOTED IN THE BINDER BOOK IN ORANGE ZONE

## 2024-11-18 NOTE — ED Notes (Signed)
 Pt refused vitals at this time.

## 2024-11-18 NOTE — Progress Notes (Signed)
 CSW sent additional requested [facility] referral documentation to [facility] to finalized [facility] referral.    Bunnie Gallop, MSW, LCSW-A  6:33 PM 11/18/2024

## 2024-11-18 NOTE — ED Notes (Signed)
 Called and left a message for [place] [employer] on the recent updates they do tx on Sunday but  not after 1pm

## 2024-11-18 NOTE — ED Notes (Signed)
 Security and [employer] at bedside. Pt refusing to change into scrubs. Pt not making sense and talking about transferring to [facility], [facility] and proximity.
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
## 2024-11-18 NOTE — ED Notes (Signed)
IVC PAPERWORK IN PROCESS 

## 2024-11-18 NOTE — ED Notes (Signed)
 PD at bedside.

## 2024-11-18 NOTE — Progress Notes (Signed)
 Patient has been denied by [facility] due to no appropriate beds available. Patient meets BH inpatient criteria per John Batter, NP. Patient has been faxed out to the following facilities:   [facility]  [address] [place] [place] [address] [phone]  [facility]  [address], [place] [address] [phone]  [facility]  [address], [place] [address] [phone]  [facility]  [address] [place], [place] [address] [phone]  [facility] Patient Placement  [facility], [place] [phone]  [facility]  [address] [place] [address] [phone]  [facility]  [address] [place] [address] [phone]  [facility]  [address] [place] [address] [phone]  [facility]  [address], [place] [phone]  [facility]  [address], [place] [address] [phone]  [facility]  [address] [place], [place] [address] [phone]  [facility]  [address] [place] [address] [phone]  [facility]  [address] [place] [address] [phone]  [facility]  [address] [place] [address] [phone]  [facility]  [address], [place] [address] [phone]  [facility]  [address], [place] [address] [phone]  [facility]  [address] [place] [address]  [phone]   Bunnie Gallop, MSW, LCSW-A  4:53 PM 11/18/2024

## 2024-11-18 NOTE — ED Notes (Signed)
Pt belongings placed in locker number 1 
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
## 2024-11-18 NOTE — ED Notes (Signed)
 Pt continues to pace in the hall, ask staff repetitive questioning, and paranoid.  EDP made aware. PRNs in place and will be administered.

## 2024-11-18 NOTE — ED Notes (Signed)
PD remains at bedside.
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
## 2024-11-18 NOTE — Progress Notes (Signed)
 CSW notified ED RN and staff to fax a copy of the IVC paperwork to [place] to complete the referral pocket. The fax number to [facility] is [phone].    Bunnie Gallop, MSW, LCSW-A  6:26 PM 11/18/2024

## 2024-11-18 NOTE — ED Notes (Addendum)
 Staffing office called by RN. Sitter to come at 1900. PD made aware. PD to stay until sitter arrives. MD notified.

## 2024-11-19 DIAGNOSIS — F29 Unspecified psychosis not due to a substance or known physiological condition: Secondary | ICD-10-CM

## 2024-11-19 NOTE — ED Notes (Signed)
 RN called patient mother at 65 concerning patient house key. Patient mother said she is not coming out tonight and she needs the key if she has to bring the dog home. She also said she will put the key up.

## 2024-11-19 NOTE — ED Provider Notes (Addendum)
 Emergency Medicine Observation Re-evaluation Note  John [name] is a 38 y.o. male, seen on rounds today.  Pt initially presented to the ED for complaints of Z04.6 Currently, the patient is IVC'd.  Physical Exam  BP (!) 146/96   Pulse 89   Temp 97.8 F (36.6 C)   Resp 16   Ht 1.956 m (6' 5)   Wt 98 kg   SpO2 100%   BMI 25.62 kg/m  Physical Exam General: wdwn male nad Cardiac: normal hr Lungs: no distress hr 100% Psych: calm  ED Course / MDM  EKG:EKG Interpretation Date/Time:  Sunday November 18 2024 17:16:50 EST Ventricular Rate:  71 PR Interval:  146 QRS Duration:  96 QT Interval:  394 QTC Calculation: 428 R Axis:   91  Text Interpretation: Normal sinus rhythm Rightward axis Minimal voltage criteria for LVH, may be normal variant ( Sokolow-Lyon ) When compared with ECG of 31-Jul-2024 10:58, PREVIOUS ECG IS PRESENT Confirmed by Doretha Folks ([id number]) on 11/18/2024 5:35:04 PM  I have reviewed the labs performed to date as well as medications administered while in observation.  Recent changes in the last 24 hours include psychiatric evaluation and plan for admission.  Plan  Current plan is for admission to [facility] facility. Workup reviewed with slightly elevated creatinine at 1.28 which has been elevated in the past we will just need outpatient follow-up for recheck CBC normal Patient reports that he is to have shoulder surgery tomorrow.  Advised that he will need to reschedule Patient accepted to [facility].  EMTALA completed    Levander Houston, MD 11/19/24 [id number]    Levander Houston, MD 11/19/24 [phone]

## 2024-11-19 NOTE — ED Notes (Addendum)
 This RN called [employer] at [phone] and left a message for supervisor Officer Hunter with detailed information about service dog being at patient's home unattended. Pt willing to give keys to get service dog out while he is getting psychiatric services at [facility]  Charge RN Warren notified and aware as well.

## 2024-11-19 NOTE — ED Notes (Signed)
 [facility] is aware patient will not be at the facility till tomorrow.

## 2024-11-19 NOTE — ED Notes (Signed)
 Sitter at bedside.

## 2024-11-19 NOTE — ED Notes (Signed)
 Sheriff called but not able to pick up patient till tomorrow morning will call when on the way.

## 2024-11-19 NOTE — ED Notes (Signed)
 Pt. Refused vitals at this time.

## 2024-11-19 NOTE — ED Notes (Addendum)
 [facility] Department to pickup patient tomorrow morning and take to [facility].  Mother Nedine picked up patient keys from patient to get service dog out of the house.  [employer] to meet mother at apartment to keep custody of dog until patient get discharged from [place].  NP Mardy and [employer] RN all notified and aware.  Social work [employer] and aware as well.  Pt cooperative at this time and sitter at bedside.
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
## 2024-11-19 NOTE — ED Notes (Signed)
 Staffing called by this RN for sitter needed & order.  Gayle from staffing reports there are no sitters at this time available.  Charge RN Warren notified and aware as well.

## 2024-11-19 NOTE — ED Notes (Signed)
 Pt provided supplies so that he can shower.

## 2024-11-20 MED ORDER — LORAZEPAM 1 MG PO TABS
2.0000 mg | ORAL_TABLET | Freq: Once | ORAL | Status: AC
  Administered 2024-11-20: 2 mg via ORAL
  Filled 2024-11-20: qty 2

## 2024-11-20 NOTE — ED Provider Notes (Signed)
 Emergency Medicine Observation Re-evaluation Note  John [name] is a 38 y.o. male, seen on rounds today.  Pt initially presented to the ED for complaints of Z04.6 Currently, the patient is awake and alert sitting upright eating a banana.  Physical Exam  BP (!) 185/116 (BP Location: Right Arm)   Pulse (!) 104   Temp 97.7 F (36.5 C) (Oral)   Resp 16   Ht 1.956 m (6' 5)   Wt 98 kg   SpO2 100%   BMI 25.62 kg/m  Physical Exam General: Adult male in no distress eating breakfast Cardiac: Rate and rhythm Lungs: No increased work of breathing Psych: Calm, aware of transfer today to inpatient behavioral health at [facility]  ED Course / MDM  EKG:EKG Interpretation Date/Time:  Sunday November 18 2024 17:16:50 EST Ventricular Rate:  71 PR Interval:  146 QRS Duration:  96 QT Interval:  394 QTC Calculation: 428 R Axis:   91  Text Interpretation: Normal sinus rhythm Rightward axis Minimal voltage criteria for LVH, may be normal variant ( Sokolow-Lyon ) When compared with ECG of 31-Jul-2024 10:58, PREVIOUS ECG IS PRESENT Confirmed by Doretha Folks ([id number]) on 11/18/2024 5:35:04 PM  I have reviewed the labs performed to date as well as medications administered while in observation.  Recent changes in the last 24 hours include acceptance, preparations for transfer to [facility].  Plan  Current plan is for transfer to [facility], inpatient behavioral health today.  Patient released from observation status.SABRA Garrick [place], MD 11/20/24 [phone]

## 2024-11-20 NOTE — ED Notes (Signed)
Sheriff at bedside.  

## 2024-11-20 NOTE — ED Notes (Signed)
 Pt belongings, including 2 envelopes of valuables, given to sheriff

## 2024-11-26 ENCOUNTER — Telehealth (HOSPITAL_COMMUNITY): Payer: Self-pay | Admitting: Clinical

## 2024-11-26 NOTE — Telephone Encounter (Signed)
 Patient called and is very worried about his dog. He is currently IVC'd. He doesn't want them injecting him with anything and his symptoms are being exacerbated by worrying about his dog. Was transferred from [facility] to another facility out of town. His sister is being manipulated by his family and he is frustrated by that situation as well. He said the hospital is telling him he is bipolar, but he denies that.  Please advise. Thank you.

## 2024-12-04 ENCOUNTER — Telehealth (HOSPITAL_COMMUNITY): Payer: Self-pay | Admitting: Clinical

## 2024-12-04 ENCOUNTER — Telehealth (HOSPITAL_COMMUNITY): Payer: Self-pay
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
## 2024-12-05 ENCOUNTER — Ambulatory Visit (HOSPITAL_COMMUNITY): Payer: MEDICAID

## 2024-12-05 ENCOUNTER — Encounter (HOSPITAL_COMMUNITY): Payer: Self-pay

## 2024-12-05 ENCOUNTER — Telehealth (HOSPITAL_COMMUNITY): Payer: Self-pay

## 2024-12-05 NOTE — Telephone Encounter (Signed)
 Received a message from Lt. Tull of the [employer] Department regarding a welfare check call for the patient. Returned the call and spoke with Megan. Duwaine said the same things to me that Brittany had heard from an earlier call, that the patient had been combative in the past with [employer] officers and they would not be going out. She shared the only way they would be going out would be for an IVC. Reported this in secure chat.

## 2024-12-07 NOTE — Progress Notes (Deleted)
" ° °  THERAPIST PROGRESS NOTE  Session Time: ***  Participation Level: {BHH PARTICIPATION LEVEL:22264}  Behavioral Response: {Appearance:22683}{BHH LEVEL OF CONSCIOUSNESS:22305}{BHH MOOD:22306}  Type of Therapy: {CHL AMB BH Type of Therapy:21022741}  Treatment Goals addressed: ***  ProgressTowards Goals: {Progress Towards Goals:21014066}  Interventions: {CHL AMB BH Type of Intervention:21022753}  Summary: John [name] is a 38 y.o. male who presents with ***.   Suicidal/Homicidal: {BHH YES OR NO:22294}{yes/no/with/without intent/plan:22693}  Therapist Response: ***  Plan: Return again in *** weeks.  Diagnosis: No diagnosis found.  Collaboration of Care: {BH OP Collaboration of Care:21014065}  Patient/Guardian was advised Release of Information must be obtained prior to any record release in order to collaborate their care with an outside provider. Patient/Guardian was advised if they have not already done so to contact the registration department to sign all necessary forms in order for us  to release information regarding their care.   Consent: Patient/Guardian gives verbal consent for treatment and assignment of benefits for services provided during this visit. Patient/Guardian expressed understanding and agreed to proceed.   John [name] 12/07/2024  "

## 2024-12-10 ENCOUNTER — Telehealth (HOSPITAL_COMMUNITY): Payer: MEDICAID | Admitting: Psychiatry

## 2024-12-10 ENCOUNTER — Encounter (HOSPITAL_COMMUNITY): Payer: Self-pay | Admitting: Psychiatry

## 2024-12-10 ENCOUNTER — Ambulatory Visit: Payer: MEDICAID | Admitting: Radiation Oncology

## 2024-12-10 DIAGNOSIS — F319 Bipolar disorder, unspecified: Secondary | ICD-10-CM | POA: Diagnosis not present

## 2024-12-10 NOTE — Progress Notes (Signed)
 BH MD/PA/NP OP Progress Note Virtual Visit via Video Note  I connected with John [name] on 12/10/2024 at  1:00 PM EST by a video enabled telemedicine application and verified that I am speaking with the correct person using two identifiers.  Location: Patient: Home Provider: Clinic   I discussed the limitations of evaluation and management by telemedicine and the availability of in person appointments. The patient expressed understanding and agreed to proceed.  I provided 45 minutes of non-face-to-face time during this encounter.    12/10/2024 2:08 PM John [name]  MRN:  [id number]  Chief Complaint: People are dumb and I'm suing for misdiagnosis  HPI: 38 year old male seen today for follow-up psychiatric evaluation.  He has a psychiatric history of bipolar 1, ADHD, and cannabis use.  He is currently managed on Adderall 5 mg twice daily.  He informed clinical research associate that his medication is effective in managing his psychiatric conditions.  Today patient reports that he had issues connecting virtually. Over   50% of his exam was done over the phone.  Patient paranoid during exam.  He also presents with pressured speech, tangential speech, and grandiosity.  Patient informed clinical research associate that he has a IQ of 140.  He notes that most people are dumb. He states that slow follows slow and that people are retarded.  He informed clinical research associate that he is taking multiple IQ test and believes that he is better than other people.   He reports that he feels that he has been misdiagnosed and plans to sue [facility] for diagnosing him with schizophrenia in the past.  Patient notes that he has autism and ADHD.  Provider unable to find a diagnosis of autism in patient's chart but does see that he was diagnosed with ADHD.  Provider recommended [facility] however patient was not agreeable.  He informed clinical research associate that he plans to follow-up with services at [facility] in the future as she finds  them more suitable.   Patient distracted throughout the exam.  He becomes irritable at times.  He denies having racing thoughts or fluctuations in mood.  He also denies impulsive behaviors but later notes that he impulsively has sex with varying females.  Patient reports that he believes his diagnosis of bipolar disorder is incorrect.  He notes that when he was hospitalized in 2020 his brother was jealous of him and placed him in the hospital because he has sex with females that were sexier than the females his brother dated.  Patient continually referred to himself as sexy. He reports that he is being stalked by an elderly male. He notes that she wants to have sex with him but notes that he would have to be paid $5000 to sleep with her. Patient denies SI/HI/VAH. He states that he is not paranoid because he has facts.  He went on to say that he does not connect with emotions but instead connected to music.  Patient notes that he wears shades often because things are too bright for him to see.    Patient describes bizarre behaviors. He is hypersexual. He notes that he rubs his testicles on his medications.  He informed clinical research associate that he believes that this is funny and notes that if anyone else takes his pill he pictures them having his balls in their mouth.  Patient denies alcohol or illegal drug use.  He also denies tobacco use.   Today patient notes that his anxiety and depression are well-managed.   Provider  conducted a GAD 7 and patient scored a 2. Provider also conducted a PHQ 9 and patient scored a 4. He endorses sleeping 6 hours nightly and reports that his appetite is adequate.  Provider recommended [facility] however he was not agreeable.  Provider also suggested trialing Abilify to help manage mood but patient was not agreeable.  Provider spoke to patient's primary psychiatric provider Mr. Lytle Bolster who notes that at this time he feels that Adderall should  not be refilled.  At this time no medication sent to preferred pharmacy.  Patient has a future appointment with his primary psychiatric provider in January.  Provider also asked patient if he would be interested in an ACT team.  He notes that he was.  Provider informed patient that a referral will be placed.  He endorsed understanding and agreed. Visit Diagnosis:    ICD-10-CM   1. Bipolar 1 disorder (HCC)  F31.9        Past Psychiatric History: Bipolar disorder versus schizophrenia, numerous psychiatry admissions, numerous presentations to the ED. has been prescribed several medications in the past including risperidone , Depakote , Lamictal , Seroquel , olanzapine .  Past Medical History:  Past Medical History:  Diagnosis Date   ADHD (attention deficit hyperactivity disorder)    Bipolar 1 disorder (HCC)    Hypertension    Seasonal allergies    Thyroid disease 2017   Unknown what exactly--[place] in 2017    Past Surgical History:  Procedure Laterality Date   ULNAR TUNNEL RELEASE Left 08/01/2024   Procedure: RELEASE, CUBITAL TUNNEL WITH ANTERIOR TRANSPOSITION;  Surgeon: Romona Harari, MD;  Location: [facility];  Service: Orthopedics;  Laterality: Left;  Left cubital tunnel release with anterior transposition [facility]   WISDOM TOOTH EXTRACTION      Family Psychiatric History: Mother, sister- schizophrenia  Family History:  Family History  Problem Relation Age of Onset   Hypertension Mother    Schizophrenia Mother    Schizophrenia Sister     Social History:  Social History   Socioeconomic History   Marital status: Single    Spouse name: Not on file   Number of children: 0   Years of education: 15   Highest education level: Some college, no degree  Occupational History   Occupation: biomedical scientist at [employer]  Tobacco Use   Smoking status: Former    Current packs/day: 0.00    Average packs/day: 0.3 packs/day for 8.0 years (2.0 ttl pk-yrs)     Types: Cigarettes    Start date: 05/02/2011    Quit date: 05/02/2019    Years since quitting: 5.6    Passive exposure: Past   Smokeless tobacco: Never  Vaping Use   Vaping status: Former  Substance and Sexual Activity   Alcohol use: Yes    Comment: 1-2 drinks weekly   Drug use: Not Currently    Types: Marijuana    Comment: None since 2022.   Sexual activity: Not Currently  Other Topics Concern   Not on file  Social History Narrative   Lives alone   Working with [facility] [employer] for some financial support.   No significant other at this time.   Social Drivers of Health   Tobacco Use: Medium Risk (11/18/2024)   Patient History    Smoking Tobacco Use: Former    Smokeless Tobacco Use: Never    Passive Exposure: Past  Physicist, Medical Strain: Low Risk (10/06/2023)   Overall Financial Resource Strain (CARDIA)  Difficulty of Paying Living Expenses: Not very hard  Food Insecurity: Food Insecurity Present (10/06/2023)   Hunger Vital Sign    Worried About Running Out of Food in the Last Year: Sometimes true    Ran Out of Food in the Last Year: Sometimes true  Transportation Needs: No Transportation Needs (10/06/2023)   PRAPARE - Administrator, Civil Service (Medical): No    Lack of Transportation (Non-Medical): No  Physical Activity: Not on file  Stress: Not on file  Social Connections: Not on file  Depression (PHQ2-9): Low Risk (12/10/2024)   Depression (PHQ2-9)    PHQ-2 Score: 3  Alcohol Screen: Not on file  Housing: Low Risk (10/06/2023)   Housing    Last Housing Risk Score: 0  Utilities: Not At Risk (10/06/2023)   AHC Utilities    Threatened with loss of utilities: No  Health Literacy: Not on file    Allergies: No Known Allergies  Metabolic Disorder Labs: Lab Results  Component Value Date   HGBA1C 5.4 10/02/2019   No results found for: PROLACTIN Lab Results  Component Value Date   CHOL 160 10/11/2023   TRIG 48 10/11/2023   HDL 48 10/11/2023    CHOLHDL 2.6 07/26/2013   VLDL 8 07/26/2013   LDLCALC 102 (H) 10/11/2023   LDLCALC 75 08/18/2022   Lab Results  Component Value Date   TSH 4.370 10/02/2019   TSH 4.457 07/26/2013    Therapeutic Level Labs: No results found for: LITHIUM Lab Results  Component Value Date   VALPROATE 19 (L) 08/02/2020   VALPROATE <10 (L) 02/24/2019   No results found for: CBMZ  Current Medications: Current Outpatient Medications  Medication Sig Dispense Refill   amLODipine  (NORVASC ) 5 MG tablet Take 1 tablet (5 mg total) by mouth daily. 90 tablet 3   atomoxetine  (STRATTERA ) 40 MG capsule Take 1 capsule (40 mg total) by mouth daily. (Patient not taking: Reported on 07/27/2024) 30 capsule 1   B Complex Vitamins (VITAMIN B COMPLEX) CAPS Take 250 capsules by mouth daily.     loratadine  (CLARITIN ) 10 MG tablet Take 10 mg by mouth as needed for allergies.     Magnesium  300 MG CAPS Take 1,200 mg by mouth daily. 4 caps by mouth daily     methylphenidate  (RITALIN ) 5 MG tablet Take 1 tablet (5 mg total) by mouth 2 (two) times daily with breakfast and lunch. 60 tablet 0   mometasone  (NASONEX ) 50 MCG/ACT nasal spray 2 sprays each nostril daily during your allergy seasons. 17 g 11   Multiple Vitamin (MULTI-VITAMIN DAILY PO) Take by mouth.     Omega-3 Fatty Acids (FISH OIL) 1000 MG CAPS Take by mouth.     THEANINE PO Take 1,000 mg by mouth. Every other day     triamcinolone  cream (KENALOG ) 0.1 % Apply twice daily to affected areas as needed. 80 g 0   Tyrosine 500 MG TABS Take 1 tablet by mouth every other day. Every other day     No current facility-administered medications for this visit.     Musculoskeletal: Strength & Muscle Tone: within normal limits and Telehealth visit Gait & Station: normal, Telehealth visit Patient leans: N/A  Psychiatric Specialty Exam: Review of Systems  There were no vitals taken for this visit.There is no height or weight on file to calculate BMI.  General Appearance:  Well Groomed  Eye Contact:  Good  Speech:  Clear and Coherent and Normal Rate  Volume:  Normal  Mood:  Anxious and Depressed  Affect:  Appropriate and Congruent  Thought Process:  Coherent, Goal Directed, and Linear  Orientation:  Full (Time, Place, and Person)  Thought Content: Logical, Paranoid Ideation, and Tangential   Suicidal Thoughts:  No  Homicidal Thoughts:  No  Memory:  Immediate;   Good Recent;   Good Remote;   Good  Judgement:  Fair  Insight:  Fair  Psychomotor Activity:  Normal  Concentration:  Concentration: Good and Attention Span: Good  Recall:  Good  Fund of Knowledge: Good  Language: Good  Akathisia:  No  Handed:  Right  AIMS (if indicated): not done  Assets:  Communication Skills Desire for Improvement Housing Leisure Time Physical Health Social Support  ADL's:  Intact  Cognition: WNL  Sleep:  Fair   Screenings: AIMS    Flowsheet Row [facility]  AIMS Total Score 0   AUDIT    Flowsheet Row [facility]  Alcohol Use Disorder Identification Test Final Score (AUDIT) 6 2   CAGE-AID    Flowsheet Row ED to Hosp-Admission (Discharged) from 07/20/2020 in [facility]  SURGICAL  CAGE-AID Score 2   GAD-7    Flowsheet Row Video Visit from 12/10/2024 in [facility] Clinical Support from 10/10/2024 in [facility] Video Visit from 08/30/2024 in [facility] Clinical Support from 06/20/2024 in [facility] Clinical Support from 05/09/2024 in [facility]  Total GAD-7 Score 2 0 1 1 7    PHQ2-9    Flowsheet Row Video Visit from 12/10/2024 in [facility] Clinical Support from 10/10/2024 in [facility] Video Visit from 08/30/2024 in [facility] Clinical Support from 06/20/2024 in [facility] Clinical Support from 05/09/2024 in [facility]  PHQ-2 Total Score 0 0 0 1 1  PHQ-9 Total Score 3 -- -- -- --   Flowsheet Row Video Visit from 12/10/2024 in [facility] ED from 11/18/2024 in [facility] Clinical Support from 10/10/2024 in [facility]  C-SSRS RISK CATEGORY No Risk No Risk No Risk     Assessment and Plan: Patient informed that his anxiety, mood, and sleep are well managed.  Patient believes he has autism. Provider recommended [facility] to [facility] however he was not agreeable.  Provider also suggested trialing Abilify to help manage mood but patient was not agreeable.  Provider spoke to patient's primary psychiatric provider Mr. Lytle Bolster who notes that at this time he feels that Adderall should not be refilled.  At this time no medication sent to preferred pharmacy.  Patient has a future appointment with his primary psychiatric provider in January.  Provider also asked patient if he would be interested in an ACT team.  He notes that he was.  Provider informed patient that a referral will be placed.  He endorsed understanding and agreed.  1. Bipolar 1 disorder (HCC) (Primary)      Collaboration of Care: Collaboration of Care: Other provider involved in patient's care AEB counselor  Patient/Guardian was advised Release of Information must be obtained prior to any record release in order to collaborate their  care with an outside provider. Patient/Guardian was advised if they have not already done so to contact the registration department to sign all necessary forms in order for us  to release  information regarding their care.   Consent: Patient/Guardian gives verbal consent for treatment and assignment of benefits for services provided during this visit. Patient/Guardian expressed understanding and agreed to proceed.   Follow-up in 3 months Follow-up with therapy  Zane FORBES Bach, NP 12/10/2024, 2:08 PM

## 2024-12-11 ENCOUNTER — Encounter (HOSPITAL_COMMUNITY): Payer: Self-pay

## 2024-12-11 ENCOUNTER — Telehealth (HOSPITAL_COMMUNITY): Payer: MEDICAID | Admitting: Physician Assistant

## 2025-01-02 ENCOUNTER — Telehealth (INDEPENDENT_AMBULATORY_CARE_PROVIDER_SITE_OTHER): Payer: MEDICAID | Admitting: Physician Assistant

## 2025-01-02 ENCOUNTER — Encounter (HOSPITAL_COMMUNITY): Payer: Self-pay | Admitting: Physician Assistant

## 2025-01-02 DIAGNOSIS — F319 Bipolar disorder, unspecified: Secondary | ICD-10-CM

## 2025-01-02 DIAGNOSIS — F900 Attention-deficit hyperactivity disorder, predominantly inattentive type: Secondary | ICD-10-CM | POA: Diagnosis not present

## 2025-01-02 DIAGNOSIS — F411 Generalized anxiety disorder: Secondary | ICD-10-CM | POA: Diagnosis not present
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
## 2025-01-07 ENCOUNTER — Telehealth (HOSPITAL_COMMUNITY): Payer: Self-pay | Admitting: Physician Assistant
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
## 2025-01-08 ENCOUNTER — Telehealth (HOSPITAL_COMMUNITY): Payer: Self-pay

## 2025-01-08 ENCOUNTER — Encounter (HOSPITAL_COMMUNITY): Payer: Self-pay

## 2025-01-08 NOTE — Telephone Encounter (Signed)
 Appointment - Message left for patient, witnessed by Rosalynne Middleton, our nature conservation officer, to follow up with patient on messages he left requesting a new individual therapist be assigned to him at the [facility] outpatient office. Informed patient on the message left, at this time, we do not have another therapist that is willing to take over his individual therapy treatment as we do not have any that feel they have the expertise to work with him effectively.  Apologized that this is the case but that patient is still welcome to be seen for medication management at the [facility] by Dr. Lynnette.  Also, informed patient we would be sending him out a letter, through the mail and MyChart both, with other possible therapist options in our community that do accept his [facility] [employer] Medicaid Plan.  Informed patient on the message, this nurse manager would also include a contact for our patient experience department if he would like to file a grievance and left this nurse managers direct phone number line at the [facility] if he would like to call back.  Letter completed today and sent through MyChart to patient and letter sent through mail, both with other community options for therapy and with our patient experience department phone number.

## 2025-01-14 ENCOUNTER — Ambulatory Visit (HOSPITAL_COMMUNITY): Payer: MEDICAID

## 2025-02-13 ENCOUNTER — Telehealth (HOSPITAL_COMMUNITY): Payer: MEDICAID | Admitting: Physician Assistant
